# Patient Record
Sex: Female | Born: 1955 | Race: White | Hispanic: No | Smoking: Former smoker
Health system: Southern US, Community
[De-identification: ages and names within clinical notes are randomized; demographics above are authoritative.]

## PROBLEM LIST (undated history)

## (undated) DIAGNOSIS — M8580 Other specified disorders of bone density and structure, unspecified site: Secondary | ICD-10-CM

## (undated) DIAGNOSIS — M1990 Unspecified osteoarthritis, unspecified site: Secondary | ICD-10-CM

## (undated) DIAGNOSIS — E119 Type 2 diabetes mellitus without complications: Secondary | ICD-10-CM

## (undated) DIAGNOSIS — Z86010 Personal history of colonic polyps: Secondary | ICD-10-CM

## (undated) DIAGNOSIS — I10 Essential (primary) hypertension: Secondary | ICD-10-CM

## (undated) DIAGNOSIS — Z85828 Personal history of other malignant neoplasm of skin: Secondary | ICD-10-CM

## (undated) DIAGNOSIS — C801 Malignant (primary) neoplasm, unspecified: Secondary | ICD-10-CM

## (undated) DIAGNOSIS — F419 Anxiety disorder, unspecified: Secondary | ICD-10-CM

## (undated) DIAGNOSIS — T7840XA Allergy, unspecified, initial encounter: Secondary | ICD-10-CM

## (undated) HISTORY — PX: COLONOSCOPY: SHX174

## (undated) HISTORY — PX: BLADDER SURGERY: SHX569

## (undated) HISTORY — DX: Other specified disorders of bone density and structure, unspecified site: M85.80

## (undated) HISTORY — DX: Allergy, unspecified, initial encounter: T78.40XA

## (undated) HISTORY — DX: Unspecified osteoarthritis, unspecified site: M19.90

## (undated) HISTORY — DX: Anxiety disorder, unspecified: F41.9

## (undated) HISTORY — DX: Personal history of colonic polyps: Z86.010

## (undated) HISTORY — DX: Type 2 diabetes mellitus without complications: E11.9

## (undated) HISTORY — DX: Malignant (primary) neoplasm, unspecified: C80.1

## (undated) HISTORY — DX: Essential (primary) hypertension: I10

## (undated) HISTORY — DX: Personal history of other malignant neoplasm of skin: Z85.828

## (undated) HISTORY — PX: OTHER SURGICAL HISTORY: SHX169

---

## 1998-09-19 ENCOUNTER — Encounter: Payer: Self-pay | Admitting: Internal Medicine

## 1998-09-19 ENCOUNTER — Ambulatory Visit (HOSPITAL_COMMUNITY): Admission: RE | Admit: 1998-09-19 | Discharge: 1998-09-19 | Payer: Self-pay | Admitting: Internal Medicine

## 2000-08-06 ENCOUNTER — Encounter: Payer: Self-pay | Admitting: Gynecology

## 2000-08-06 ENCOUNTER — Encounter: Admission: RE | Admit: 2000-08-06 | Discharge: 2000-08-06 | Payer: Self-pay | Admitting: Gynecology

## 2001-07-27 ENCOUNTER — Other Ambulatory Visit: Admission: RE | Admit: 2001-07-27 | Discharge: 2001-07-27 | Payer: Self-pay | Admitting: Gynecology

## 2001-10-21 ENCOUNTER — Encounter: Admission: RE | Admit: 2001-10-21 | Discharge: 2001-10-21 | Payer: Self-pay | Admitting: Gynecology

## 2001-10-21 ENCOUNTER — Encounter: Payer: Self-pay | Admitting: Gynecology

## 2002-09-05 ENCOUNTER — Other Ambulatory Visit: Admission: RE | Admit: 2002-09-05 | Discharge: 2002-09-05 | Payer: Self-pay | Admitting: Gynecology

## 2002-12-29 ENCOUNTER — Encounter: Admission: RE | Admit: 2002-12-29 | Discharge: 2002-12-29 | Payer: Self-pay | Admitting: Gynecology

## 2002-12-29 ENCOUNTER — Encounter: Payer: Self-pay | Admitting: Gynecology

## 2003-09-20 ENCOUNTER — Other Ambulatory Visit: Admission: RE | Admit: 2003-09-20 | Discharge: 2003-09-20 | Payer: Self-pay | Admitting: Gynecology

## 2004-02-07 ENCOUNTER — Ambulatory Visit (HOSPITAL_COMMUNITY): Admission: RE | Admit: 2004-02-07 | Discharge: 2004-02-07 | Payer: Self-pay | Admitting: Gynecology

## 2004-07-11 ENCOUNTER — Ambulatory Visit: Payer: Self-pay | Admitting: Internal Medicine

## 2004-11-08 ENCOUNTER — Other Ambulatory Visit: Admission: RE | Admit: 2004-11-08 | Discharge: 2004-11-08 | Payer: Self-pay | Admitting: Gynecology

## 2005-02-27 ENCOUNTER — Ambulatory Visit (HOSPITAL_COMMUNITY): Admission: RE | Admit: 2005-02-27 | Discharge: 2005-02-27 | Payer: Self-pay | Admitting: Gynecology

## 2005-06-05 ENCOUNTER — Other Ambulatory Visit: Admission: RE | Admit: 2005-06-05 | Discharge: 2005-06-05 | Payer: Self-pay | Admitting: Gynecology

## 2005-06-16 ENCOUNTER — Ambulatory Visit: Payer: Self-pay | Admitting: Internal Medicine

## 2005-11-19 ENCOUNTER — Other Ambulatory Visit: Admission: RE | Admit: 2005-11-19 | Discharge: 2005-11-19 | Payer: Self-pay | Admitting: Gynecology

## 2006-05-06 ENCOUNTER — Ambulatory Visit (HOSPITAL_COMMUNITY): Admission: RE | Admit: 2006-05-06 | Discharge: 2006-05-06 | Payer: Self-pay | Admitting: Gynecology

## 2006-05-13 ENCOUNTER — Other Ambulatory Visit: Admission: RE | Admit: 2006-05-13 | Discharge: 2006-05-13 | Payer: Self-pay | Admitting: Gynecology

## 2006-09-23 ENCOUNTER — Ambulatory Visit: Payer: Self-pay | Admitting: Internal Medicine

## 2007-02-01 ENCOUNTER — Other Ambulatory Visit: Admission: RE | Admit: 2007-02-01 | Discharge: 2007-02-01 | Payer: Self-pay | Admitting: Gynecology

## 2007-04-14 ENCOUNTER — Ambulatory Visit: Payer: Self-pay | Admitting: Internal Medicine

## 2007-04-28 ENCOUNTER — Ambulatory Visit: Payer: Self-pay | Admitting: Internal Medicine

## 2007-04-28 ENCOUNTER — Encounter: Payer: Self-pay | Admitting: Internal Medicine

## 2007-04-28 LAB — HM COLONOSCOPY: HM Colonoscopy: NORMAL

## 2007-05-11 DIAGNOSIS — J309 Allergic rhinitis, unspecified: Secondary | ICD-10-CM | POA: Insufficient documentation

## 2007-05-11 DIAGNOSIS — I10 Essential (primary) hypertension: Secondary | ICD-10-CM

## 2007-05-12 ENCOUNTER — Ambulatory Visit: Payer: Self-pay | Admitting: Internal Medicine

## 2007-05-12 DIAGNOSIS — M4180 Other forms of scoliosis, site unspecified: Secondary | ICD-10-CM | POA: Insufficient documentation

## 2007-05-12 DIAGNOSIS — J329 Chronic sinusitis, unspecified: Secondary | ICD-10-CM | POA: Insufficient documentation

## 2007-05-12 DIAGNOSIS — M549 Dorsalgia, unspecified: Secondary | ICD-10-CM | POA: Insufficient documentation

## 2007-05-12 DIAGNOSIS — L608 Other nail disorders: Secondary | ICD-10-CM | POA: Insufficient documentation

## 2007-05-25 ENCOUNTER — Encounter: Payer: Self-pay | Admitting: Internal Medicine

## 2007-07-28 ENCOUNTER — Ambulatory Visit (HOSPITAL_COMMUNITY): Admission: RE | Admit: 2007-07-28 | Discharge: 2007-07-28 | Payer: Self-pay | Admitting: Gynecology

## 2007-08-03 ENCOUNTER — Encounter: Payer: Self-pay | Admitting: Internal Medicine

## 2007-08-19 ENCOUNTER — Encounter: Payer: Self-pay | Admitting: Internal Medicine

## 2007-08-27 ENCOUNTER — Ambulatory Visit: Payer: Self-pay | Admitting: Internal Medicine

## 2007-09-09 ENCOUNTER — Ambulatory Visit: Payer: Self-pay | Admitting: Internal Medicine

## 2008-04-14 ENCOUNTER — Telehealth: Payer: Self-pay | Admitting: Internal Medicine

## 2008-08-30 ENCOUNTER — Ambulatory Visit (HOSPITAL_COMMUNITY): Admission: RE | Admit: 2008-08-30 | Discharge: 2008-08-30 | Payer: Self-pay | Admitting: Gynecology

## 2009-04-05 ENCOUNTER — Encounter: Payer: Self-pay | Admitting: Internal Medicine

## 2009-06-26 ENCOUNTER — Ambulatory Visit: Payer: Self-pay | Admitting: Internal Medicine

## 2009-06-26 DIAGNOSIS — M899 Disorder of bone, unspecified: Secondary | ICD-10-CM | POA: Insufficient documentation

## 2009-06-26 DIAGNOSIS — M949 Disorder of cartilage, unspecified: Secondary | ICD-10-CM

## 2009-06-26 DIAGNOSIS — Z87891 Personal history of nicotine dependence: Secondary | ICD-10-CM | POA: Insufficient documentation

## 2009-06-26 DIAGNOSIS — Z85828 Personal history of other malignant neoplasm of skin: Secondary | ICD-10-CM

## 2009-06-26 LAB — CONVERTED CEMR LAB
Specific Gravity, Urine: 1.025
WBC Urine, dipstick: NEGATIVE

## 2009-06-28 ENCOUNTER — Telehealth: Payer: Self-pay | Admitting: *Deleted

## 2009-06-28 LAB — CONVERTED CEMR LAB
Alkaline Phosphatase: 77 units/L (ref 39–117)
BUN: 18 mg/dL (ref 6–23)
Bilirubin, Direct: 0.1 mg/dL (ref 0.0–0.3)
Calcium: 9 mg/dL (ref 8.4–10.5)
Cholesterol: 212 mg/dL — ABNORMAL HIGH (ref 0–200)
Direct LDL: 152.9 mg/dL
Eosinophils Absolute: 0.1 10*3/uL (ref 0.0–0.7)
Eosinophils Relative: 2.4 % (ref 0.0–5.0)
GFR calc non Af Amer: 79.92 mL/min (ref >60)
Glucose, Bld: 103 mg/dL — ABNORMAL HIGH (ref 70–99)
HCT: 39.8 % (ref 36.0–46.0)
Hemoglobin: 13.7 g/dL (ref 12.0–15.0)
Lymphocytes Relative: 42.6 % (ref 12.0–46.0)
MCHC: 34.4 g/dL (ref 30.0–36.0)
MCV: 96.4 fL (ref 78.0–100.0)
Neutrophils Relative %: 46.5 % (ref 43.0–77.0)
Platelets: 211 10*3/uL (ref 150.0–400.0)
RBC: 4.12 M/uL (ref 3.87–5.11)
RDW: 11.7 % (ref 11.5–14.6)
TSH: 1.34 microintl units/mL (ref 0.35–5.50)
Total Protein: 6.9 g/dL (ref 6.0–8.3)
Triglycerides: 106 mg/dL (ref 0.0–149.0)
WBC: 3.9 10*3/uL — ABNORMAL LOW (ref 4.5–10.5)

## 2009-10-08 ENCOUNTER — Ambulatory Visit (HOSPITAL_COMMUNITY): Admission: RE | Admit: 2009-10-08 | Discharge: 2009-10-08 | Payer: Self-pay | Admitting: Gynecology

## 2009-10-12 ENCOUNTER — Encounter: Admission: RE | Admit: 2009-10-12 | Discharge: 2009-10-12 | Payer: Self-pay | Admitting: Gynecology

## 2010-06-09 ENCOUNTER — Encounter: Payer: Self-pay | Admitting: Internal Medicine

## 2010-06-17 ENCOUNTER — Ambulatory Visit: Payer: Self-pay | Admitting: Internal Medicine

## 2010-06-17 DIAGNOSIS — J019 Acute sinusitis, unspecified: Secondary | ICD-10-CM

## 2010-09-22 ENCOUNTER — Encounter: Payer: Self-pay | Admitting: Gynecology

## 2010-10-03 NOTE — Letter (Signed)
Summary: [facility]  [facility]   Imported By: Maryln Gottron 06/13/2010 10:15:23  _____________________________________________________________________  External Attachment:    Type:   Image     Comment:   External Document

## 2010-10-03 NOTE — Assessment & Plan Note (Signed)
Summary: SINUS PRESSURE, CONGESTION // RS OPT RSC APPT TIME/NJR   Vital Signs:  Patient profile:   55 year old female Height:      65.5 inches Weight:      151 pounds BMI:     24.83 Temp:     100.0 degrees F oral Pulse rate:   72 / minute BP sitting:   100 / 60  (right arm) Cuff size:   regular  Vitals Entered By: Romualdo Bolk, CMA (AAMA) (June 17, 2010 3:40 PM) CC: Seen at [facility] 06/09/10 for sinus infection given amoxil 875mg . Pt states that she still has the sinus pressure, coughing, congestion and left ear has fluid in it. This has been going on for 3 weeks.   History of Present Illness: Valerie Gates comes in today  for above.   No better,     left side of face pressure.  was seen UC after a week of uri and rx with amox  for sinusitis  drainage becam clearer but now getting worse with increase pressure and feeling bad . tendes to get fall allergies  restarted with floase and allegra  last week. Still a problem .  NO fever known some cough but no sob.  Preventive Screening-Counseling & Management  Alcohol-Tobacco     Alcohol drinks/day: <1     Alcohol type: all     Smoking Status: quit     Year Quit: 21 years ago  Caffeine-Diet-Exercise     Caffeine use/day: 2-3     Does Patient Exercise: yes     Type of exercise: walking     Times/week: 7  Current Medications (verified): 1)  Allegra 180 Mg Tabs (Fexofenadine Hcl) .... Take 1 Tablet By Mouth Once A Day 2)  Benicar 20 Mg Tabs (Olmesartan Medoxomil) .... Take 1/2 Tablet By Mouth Once A Day 3)  Flonase 50 Mcg/act  Susp (Fluticasone Propionate) .... Use A Directed 4)  Naproxen 500 Mg Tabs (Naproxen) .Marland Kitchen.. 1 By Mouth Two Times A Day For Back Pain  Allergies (verified): No Known Drug Allergies  Past History:  Past medical, surgical, family and social histories (including risk factors) reviewed for relevance to current acute and chronic problems.  Past Medical History: Reviewed history from 06/26/2009  and no changes required. Allergic rhinitis Hypertension g4p3 Skin cancer, hx of  Past Surgical History: Reviewed history from 06/26/2009 and no changes required. Denies surgical history skin cancer  head   Past History:  Care Management: Gynecology: [facility] Gastroenterology: Juanda Chance Dermatology: [facility]   Family History: Reviewed history from 06/26/2009 and no changes required. Family History Diabetes 1st degree relative Family History Hypertension Family History Lung cancer Family History of Stroke M 1st degree relative <50  Social History: Reviewed history from 06/26/2009 and no changes required. 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  The patient denies anorexia, weight loss, weight gain, vision loss, chest pain, peripheral edema, prolonged cough, hemoptysis, difficulty walking, abnormal bleeding, and enlarged lymph nodes.    Physical Exam  General:  congested and in nad  non toxic Head:  normocephalic and atraumatic.   Eyes:  vision grossly intact.   Ears:  R ear normal, L ear normal, and no external deformities.   Nose:  no external  deformity and no external erythema.  left more than right  Mouth:  pharynx pink and moist.   Neck:  goiter no adenopathy  Lungs:  Normal respiratory effort, chest expands symmetrically. Lungs are clear to auscultation, no crackles or wheezes.no dullness.   Heart:  Normal rate and regular rhythm. S1 and S2 normal without gallop, murmur, click, rub or other extra sounds. Abdomen:  Bowel sounds positive,abdomen soft and non-tender without masses, organomegaly or  noted. Pulses:  pulses intact without delay   Extremities:  no clubbing cyanosis or edema  Neurologic:  non focal  Skin:  turgor normal, color normal, no ecchymoses, and no petechiae.     Cervical Nodes:  No lymphadenopathy noted Psych:  Oriented X3, normally interactive, and good eye contact.     Impression & Recommendations:  Problem # 1:  SINUSITIS - ACUTE-NOS (ICD-461.9) poss partially treated bacterial plus allergic .Marland Kitchen will rc for both .  and follow up if persistent or  progressive   Her updated medication list for this problem includes:    Flonase 50 Mcg/act Susp (Fluticasone propionate) ..... Use a directed    Amoxicillin-pot Clavulanate 875-125 Mg Tabs (Amoxicillin-pot clavulanate) .Marland Kitchen... 1 by mouth two times a day for sinusitis  Problem # 2:  ALLERGIC RHINITIS (ICD-477.9)  Her updated medication list for this problem includes:    Allegra 180 Mg Tabs (Fexofenadine hcl) .Marland Kitchen... Take 1 tablet by mouth once a day    Flonase 50 Mcg/act Susp (Fluticasone propionate) ..... Use a directed  Complete Medication List: 1)  Allegra 180 Mg Tabs (Fexofenadine hcl) .... Take 1 tablet by mouth once a day 2)  Benicar 20 Mg Tabs (Olmesartan medoxomil) .... Take 1/2 tablet by mouth once a day 3)  Flonase 50 Mcg/act Susp (Fluticasone propionate) .... Use a directed 4)  Naproxen 500 Mg Tabs (Naproxen) .Marland Kitchen.. 1 by mouth two times a day for back pain 5)  Amoxicillin-pot Clavulanate 875-125 Mg Tabs (Amoxicillin-pot clavulanate) .Marland Kitchen.. 1 by mouth two times a day for sinusitis 6)  Prednisone 20 Mg Tabs (Prednisone) .... Take 3 by mouth for 2 days and then 2 by mouth once daily for 3 days  or as directed  Patient Instructions: 1)  try afrin nose spray for up to 3 days  to relieve  the pressure. also saline washes and stay on allergy meds  2)  change antibiotc  if not improved in 3 days or so then add the prednisone as directed . 3)  call if not better by end of med and stay on allergy meds  Prescriptions: PREDNISONE 20 MG TABS (PREDNISONE) take 3 by mouth for 2 days and then 2 by mouth once daily for 3 days  or as directed  #15 x 0   Entered and Authorized by:   Madelin Headings MD   Signed  by:   Madelin Headings MD on 06/17/2010   Method used:   Print then Give to Patient   RxID:   [id number] AMOXICILLIN-POT CLAVULANATE 875-125 MG TABS (AMOXICILLIN-POT CLAVULANATE) 1 by mouth two times a day for sinusitis  #20 x 0   Entered and Authorized by:   Madelin Headings MD   Signed by:   Madelin Headings MD on 06/17/2010   Method used:   [facility] to        [facility]  [address] [phone]* (retail)       [address]  [place], [place]  [address]       Ph: [phone] or [phone]       Fax: [phone]   RxID:   [id number]    Orders Added: 1)  Est. Patient Level IV [27253]

## 2010-10-09 ENCOUNTER — Other Ambulatory Visit (HOSPITAL_COMMUNITY): Payer: Self-pay | Admitting: Gynecology

## 2010-10-09 DIAGNOSIS — Z1231 Encounter for screening mammogram for malignant neoplasm of breast: Secondary | ICD-10-CM

## 2010-10-23 ENCOUNTER — Ambulatory Visit (HOSPITAL_COMMUNITY): Payer: 59 | Attending: Gynecology

## 2010-11-12 ENCOUNTER — Ambulatory Visit (HOSPITAL_COMMUNITY)
Admission: RE | Admit: 2010-11-12 | Discharge: 2010-11-12 | Disposition: A | Discharge status: Home / Self Care | Payer: 59 | Source: Ambulatory Visit | Attending: Gynecology | Admitting: Gynecology

## 2010-11-12 DIAGNOSIS — Z1231 Encounter for screening mammogram for malignant neoplasm of breast: Secondary | ICD-10-CM | POA: Insufficient documentation

## 2011-02-08 ENCOUNTER — Other Ambulatory Visit: Payer: Self-pay | Admitting: Internal Medicine

## 2011-02-10 ENCOUNTER — Other Ambulatory Visit: Payer: Self-pay | Admitting: Internal Medicine

## 2011-02-10 MED ORDER — NAPROXEN 500 MG PO TABS: 500.0000 mg | ORAL_TABLET | Freq: Two times a day (BID) | ORAL | Status: DC

## 2011-02-10 NOTE — Telephone Encounter (Signed)
OK to refill once.   

## 2011-02-10 NOTE — Telephone Encounter (Signed)
Pt req new script for NAPROXEN 500 MG TABS (NAPROXEN). Pls call in to [facility] on [address].

## 2011-02-10 NOTE — Telephone Encounter (Signed)
Pt needs to schedule a cpx before next refill.

## 2011-02-10 NOTE — Telephone Encounter (Signed)
rx sent to pharmacy

## 2011-10-13 ENCOUNTER — Telehealth: Payer: Self-pay | Admitting: *Deleted

## 2011-10-13 MED ORDER — FLUTICASONE PROPIONATE 50 MCG/ACT NA SUSP: NASAL | Status: DC

## 2011-10-13 NOTE — Telephone Encounter (Signed)
Refill fluticasone

## 2011-10-15 ENCOUNTER — Ambulatory Visit (INDEPENDENT_AMBULATORY_CARE_PROVIDER_SITE_OTHER): Payer: 59 | Admitting: Internal Medicine

## 2011-10-15 ENCOUNTER — Encounter: Payer: Self-pay | Admitting: Internal Medicine

## 2011-10-15 VITALS — BP 100/70 | HR 66 | Wt 152.0 lb

## 2011-10-15 DIAGNOSIS — J309 Allergic rhinitis, unspecified: Secondary | ICD-10-CM

## 2011-10-15 DIAGNOSIS — M4180 Other forms of scoliosis, site unspecified: Secondary | ICD-10-CM

## 2011-10-15 DIAGNOSIS — M549 Dorsalgia, unspecified: Secondary | ICD-10-CM

## 2011-10-15 DIAGNOSIS — I10 Essential (primary) hypertension: Secondary | ICD-10-CM

## 2011-10-15 MED ORDER — FLUTICASONE PROPIONATE 50 MCG/ACT NA SUSP: NASAL | Status: DC

## 2011-10-15 NOTE — Progress Notes (Signed)
  Subjective:    Patient ID: Valerie Gates, female    DOB: 06-05-1956, 56 y.o.   MRN: [id number]  HPI   Patient comes in today for followup of medication. He has not been here in almost 2 years she is doing well  She sees her gynecologist Dr. Nicholas Lose on regular basis who is also treating her hypertension with Benicar.  She's has perennial allergies and takes Allegra once daily and Flonase when she thinks of it needs a refill on her Flonase. She doesn't take these medications she tends to get asthmatic bronchitis and asthma flares.  Ex-smoker no tobacco for years no exposures.  In regard to naproxen she takes occasional couple times a month at the most for her back no side effects noted. She still has some available.   Review of Systems Negative current chest pain shortness of breath falling cardiovascular symptoms other concerns  Past history family history social history reviewed in the electronic medical record.     Objective:   Physical Exam  Well-developed well-nourished in no acute distress HEENT: Normocephalic ;atraumatic , Eyes;  PERRL, EOMs  Full, lids and conjunctiva clear,,Ears: no deformities, canals nl, TM landmarks normal, Nose: no deformity or discharge  Mouth : OP clear without lesion or edema . Chest:  Clear to A&P without wheezes rales or rhonchi CV:  S1-S2 no gallops or murmurs peripheral perfusion is normal Neck: Supple without adenopathy or masses or bruits  Skin: normal capillary refill ,turgor , color: No acute rashes ,petechiae or bruising     Assessment & Plan:  Perennial allergic rhinitis  Discussed use of medication and suggestion take the Flonase on a regular basis for better control depending on what her symptoms are doing.  Intermittent low back pain with history of same history scoliosis no alarm features is not taking anti-inflammatories very often at all does not really need a refill made patient aware of interference potential with hypertension and  medications and renal function   Hypertension on  Benicar  treated and managed by her gynecologist she states that they have done for routine labs.  She declined to T. definite flu shot never had a flu or flu shot could come back for T. definite wishes

## 2011-10-15 NOTE — Patient Instructions (Signed)
REC   take flonase on a reg basis and the allegra as needed.  Call if needed.   Naproxyn can interfere with BP meds but ok to take as needed.

## 2011-10-15 NOTE — Progress Notes (Signed)
  Subjective:    Patient ID: Valerie Gates, female    DOB: 1956-05-26, 56 y.o.   MRN: [id number]  HPI  Allegra and flonase    Worst  Time of year.     naproxyn  Prn  Back once or twice a months.   Review of Systems     Objective:   Physical Exam        Assessment & Plan:

## 2011-11-12 ENCOUNTER — Other Ambulatory Visit (HOSPITAL_COMMUNITY): Payer: Self-pay | Admitting: Gynecology

## 2011-11-12 DIAGNOSIS — Z1231 Encounter for screening mammogram for malignant neoplasm of breast: Secondary | ICD-10-CM

## 2011-11-16 ENCOUNTER — Other Ambulatory Visit: Payer: Self-pay | Admitting: Internal Medicine

## 2011-11-18 NOTE — Telephone Encounter (Signed)
Left message on machine to have gyn fax [place] a copy of her last labs.

## 2011-11-18 NOTE — Telephone Encounter (Signed)
We need copy of her recent labs lfts and bmp from her gyne to continue to refilling her nsaid.    Refill x 1 while she gets [place] this to our record.  Thanks.

## 2011-12-10 ENCOUNTER — Ambulatory Visit (HOSPITAL_COMMUNITY): Payer: 59

## 2011-12-11 ENCOUNTER — Ambulatory Visit (HOSPITAL_COMMUNITY)
Admission: RE | Admit: 2011-12-11 | Discharge: 2011-12-11 | Disposition: A | Discharge status: Home / Self Care | Payer: 59 | Source: Ambulatory Visit | Attending: Gynecology | Admitting: Gynecology

## 2011-12-11 DIAGNOSIS — Z1231 Encounter for screening mammogram for malignant neoplasm of breast: Secondary | ICD-10-CM

## 2011-12-29 ENCOUNTER — Other Ambulatory Visit: Payer: Self-pay | Admitting: Internal Medicine

## 2012-07-06 ENCOUNTER — Telehealth: Payer: Self-pay | Admitting: Internal Medicine

## 2012-07-06 NOTE — Telephone Encounter (Signed)
Caller: Keilana/Patient; Patient Name: Valerie Gates; PCP: Berniece [facility]); Best Callback Phone Number: [phone] Calling regarding back pain that started 07/03/12, hurts when she moves certain ways, lower back pain and rates a 3-4 at this time. Has not taken any medicine for pain. Pain is constant. Afebrile. Wants a refill on Naprosyn, was told she needed labs first. Emergent signs and symptoms ruled out as per Back Symptoms protocol except for see in 72 hours due to mild to moderate pain with normal activity, appt scheduled for 07/07/12 at 2:15 unable to come 07/06/12. Call back parameters given and advised Tylenol/Motrin as directed.

## 2012-07-07 ENCOUNTER — Ambulatory Visit: Payer: 59 | Admitting: Internal Medicine

## 2012-09-10 ENCOUNTER — Encounter: Payer: Self-pay | Admitting: Family Medicine

## 2012-09-10 ENCOUNTER — Ambulatory Visit (INDEPENDENT_AMBULATORY_CARE_PROVIDER_SITE_OTHER): Payer: 59 | Admitting: Family Medicine

## 2012-09-10 VITALS — BP 112/88 | HR 106 | Temp 98.4°F | Wt 152.0 lb

## 2012-09-10 DIAGNOSIS — J069 Acute upper respiratory infection, unspecified: Secondary | ICD-10-CM

## 2012-09-10 NOTE — Patient Instructions (Signed)

## 2012-09-10 NOTE — Progress Notes (Signed)
Chief Complaint  Patient presents with  . sinus congestion and pressure     started Monday     HPI:  Acute visit for ?URI: -started:had sneezing and allergy issues for 3 days, then starting yesterday worsened -symptoms:nasal congestion, sore throat, cough, sinus pressure, sneezing, watery eyes -denies:fever, SOB, NVD, tooth pain -has tried: flonase, Careers adviser -sick contacts: none known -Hx of: AR and sinusitis  ROS: See pertinent positives and negatives per HPI.  Past Medical History  Diagnosis Date  . Allergic rhinitis   . Hypertension   . History of skin cancer     Family History  Problem Relation Age of Onset  . Diabetes    . Hypertension    . Lung cancer    . Stroke      female     History   Social History  . Marital Status: Divorced    Spouse Name: N/A    Number of Children: N/A  . Years of Education: N/A   Social History Main Topics  . Smoking status: Former Games developer  . Smokeless tobacco: None  . Alcohol Use: No  . Drug Use: No  . Sexually Active: None   Other Topics Concern  . 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    Current outpatient prescriptions:fexofenadine (ALLEGRA) 180 MG tablet, Take 180 mg by mouth daily., Disp: , Rfl: ;  fluticasone (FLONASE) 50 MCG/ACT nasal spray, Use as directed, Disp: 16 g, Rfl: 12;  fluticasone (FLONASE) 50 MCG/ACT nasal spray, USE AS DIRECTED, Disp: 16 g, Rfl: 3;  olmesartan (BENICAR) 20 MG tablet, Take 20 mg by mouth daily., Disp: , Rfl:  naproxen (NAPROSYN) 500 MG tablet, TAKE ONE TABLET BY MOUTH TWICE DAILY FOR BACK PAIN., Disp: 60 tablet, Rfl: 0  EXAM:  Filed Vitals:   09/10/12 1422  BP: 112/88  Pulse: 106  Temp: 98.4 F (36.9 C)    There is no height on file to calculate BMI.  GENERAL: vitals reviewed and listed above, alert, oriented, appears well hydrated and in no acute distress  HEENT: atraumatic, conjunttiva  clear, no obvious abnormalities on inspection of external nose and ears, normal appearance of ear canals and TMs, clear nasal congestion, mild post oropharyngeal erythema with PND, no tonsillar edema or exudate, no sinus TTP  NECK: no obvious masses on inspection  LUNGS: clear to auscultation bilaterally, no wheezes, rales or rhonchi, good air movement  CV: HRRR, no peripheral edema  MS: moves all extremities without noticeable abnormality  PSYCH: pleasant and cooperative, no obvious depression or anxiety  ASSESSMENT AND PLAN:  Discussed the following assessment and plan:  1. Upper respiratory infection   -likely viral URI, continued allergy meds, supportive care and return precuations -Patient advised to return or notify a doctor immediately if symptoms worsen or persist or new concerns arise.  Patient Instructions  INSTRUCTIONS FOR UPPER RESPIRATORY INFECTION:  -plenty of rest and fluids  -nasal saline wash 2-3 times daily (use prepackaged nasal saline or bottled/distilled water if making your own)   -clean nose with nasal saline before using the nasal steroid or sinex  -can use sinex nasal spray for drainage and nasal congestion - but do NOT use longer then 3-4 days  -can use tylenol or ibuprofen as directed for aches and sorethroat  -in the winter time, using a humidifier at night is helpful (please follow cleaning instructions)  -if you are taking a cough medication -  use only as directed, may also try a teaspoon of honey to coat the throat and throat lozenges  -for sore throat, salt water gargles can help  -follow up if you have fevers, facial pain, tooth pain, difficulty breathing or are worsening or not getting better in 5-7 days      Zilpha Mcandrew R.

## 2013-03-17 ENCOUNTER — Telehealth: Payer: Self-pay | Admitting: Internal Medicine

## 2013-03-17 ENCOUNTER — Ambulatory Visit (INDEPENDENT_AMBULATORY_CARE_PROVIDER_SITE_OTHER): Payer: 59 | Admitting: Internal Medicine

## 2013-03-17 ENCOUNTER — Encounter: Payer: Self-pay | Admitting: Internal Medicine

## 2013-03-17 VITALS — BP 108/72 | HR 85 | Temp 98.2°F | Wt 149.0 lb

## 2013-03-17 DIAGNOSIS — Z5181 Encounter for therapeutic drug level monitoring: Secondary | ICD-10-CM | POA: Insufficient documentation

## 2013-03-17 DIAGNOSIS — I10 Essential (primary) hypertension: Secondary | ICD-10-CM

## 2013-03-17 DIAGNOSIS — J309 Allergic rhinitis, unspecified: Secondary | ICD-10-CM

## 2013-03-17 DIAGNOSIS — M4180 Other forms of scoliosis, site unspecified: Secondary | ICD-10-CM

## 2013-03-17 LAB — HEPATIC FUNCTION PANEL
ALT: 26 U/L (ref 0–35)
Albumin: 4 g/dL (ref 3.5–5.2)

## 2013-03-17 LAB — CBC WITH DIFFERENTIAL/PLATELET
Eosinophils Relative: 1.9 % (ref 0.0–5.0)
HCT: 40 % (ref 36.0–46.0)
Hemoglobin: 13.4 g/dL (ref 12.0–15.0)
Lymphocytes Relative: 39 % (ref 12.0–46.0)
MCHC: 33.5 g/dL (ref 30.0–36.0)
MCV: 95 fl (ref 78.0–100.0)
Monocytes Relative: 7.9 % (ref 3.0–12.0)
Neutro Abs: 2.5 10*3/uL (ref 1.4–7.7)
Neutrophils Relative %: 50.1 % (ref 43.0–77.0)
Platelets: 224 10*3/uL (ref 150.0–400.0)
RBC: 4.22 Mil/uL (ref 3.87–5.11)
WBC: 5 10*3/uL (ref 4.5–10.5)

## 2013-03-17 LAB — BASIC METABOLIC PANEL
BUN: 15 mg/dL (ref 6–23)
CO2: 29 mEq/L (ref 19–32)
Potassium: 4.5 mEq/L (ref 3.5–5.1)

## 2013-03-17 MED ORDER — FLUTICASONE PROPIONATE 50 MCG/ACT NA SUSP: NASAL | Status: DC

## 2013-03-17 MED ORDER — OLMESARTAN MEDOXOMIL 20 MG PO TABS: 20.0000 mg | ORAL_TABLET | Freq: Every day | ORAL | Status: DC

## 2013-03-17 MED ORDER — NAPROXEN 500 MG PO TABS: ORAL_TABLET | ORAL | Status: DC

## 2013-03-17 NOTE — Telephone Encounter (Deleted)
Pt's Benicar 20mg  had been rx'd by her GYN doctor, Dr. Nicholas Lose. He has retired, and her rx has expired. She wants to know if Dr. Fabian Sharp will fill? Asking her to fill even for 30 days, if she needs an OV w/ WP.  [facility]

## 2013-03-17 NOTE — Progress Notes (Signed)
Chief Complaint  Patient presents with  . Follow-up    Meds  . Hypertension    HPI: Patient comes in today for SDA for   problem evaluation. Last ov with me was 2 /13  Since that time  She has done well but her  Luisa Dago has retired .  Dr Merri Brunette her Benicar for 7- 8  Years for  Borderline high bp  Has done well no se  . No recent blood tests.  Naproxyn.  Off and on for   2 years  Sporadically    Extra strength tylenol for back pain or so .  No se   Allergic  Ok comes and goes.   Needs refill flonase no asthma sx   No  tobacco  Quit 27 years., ROS: See pertinent positives and negatives per HPI. ocass toe cramps   Walks exercises at least 4 x per week no limitations UTD on HCM per pat and review  Past Medical History  Diagnosis Date  . Allergic rhinitis   . Hypertension   . History of skin cancer     Family History  Problem Relation Age of Onset  . Diabetes    . Hypertension    . Lung cancer    . Stroke      female     History   Social History  . Marital Status: Divorced    Spouse Name: N/A    Number of Children: N/A  . Years of Education: N/A   Social History Main Topics  . Smoking status: Former Games developer  . Smokeless tobacco: None  . Alcohol Use: No  . Drug Use: No  . Sexually Active: None   Other Topics Concern  . 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 fexofenadine (ALLEGRA) 180 MG tablet Take 180 mg by mouth daily.      . fluticasone (FLONASE) 50 MCG/ACT nasal spray 2 sprays in each nostril daily  16 g  11  . olmesartan (BENICAR) 20 MG tablet Take 1 tablet (20 mg total) by mouth daily.  30 tablet  11  . [DISCONTINUED] fluticasone (FLONASE) 50 MCG/ACT nasal spray USE AS DIRECTED  16 g  3  . [DISCONTINUED] olmesartan (BENICAR) 20 MG tablet Take 20 mg by mouth  daily.      . naproxen (NAPROSYN) 500 MG tablet TAKE ONE TABLET BY MOUTH TWICE DAILY FOR BACK PAIN.  60 tablet  1  . [DISCONTINUED] fluticasone (FLONASE) 50 MCG/ACT nasal spray Use as directed  16 g  12  . [DISCONTINUED] naproxen (NAPROSYN) 500 MG tablet TAKE ONE TABLET BY MOUTH TWICE DAILY FOR BACK PAIN.  60 tablet  0   No facility-administered encounter medications on file as of 03/17/2013.    EXAM:  BP 108/72  Pulse 85  Temp(Src) 98.2 F (36.8 C) (Oral)  Wt 149 lb (67.586 kg)  BMI 24.41 kg/m2  SpO2 98%  Body mass index is 24.41 kg/(m^2).  GENERAL: vitals reviewed and listed above, alert, oriented, appears well hydrated and in no acute distress HEENT: atraumatic, conjunctiva  clear, no obvious abnormalities on inspection of external nose and ears OP : no lesion edema or exudate  NECK: no obvious masses on inspection palpation  No adenopathy ? Thyroid palpable  no nodules LUNGS: clear to auscultation bilaterally, no wheezes, rales or rhonchi, good air movement CV: HRRR, no clubbing cyanosis or  peripheral edema nl cap refill  MS: moves all extremities without noticeable focal  abnormality PSYCH: pleasant and cooperative, no obvious depression or anxiety  ASSESSMENT AND PLAN:  Discussed the following assessment and plan:  HYPERTENSION - controlleld  take over  management from GYNE  lab monitoring risk reduction - Plan: Basic metabolic panel, CBC with Differential, Hepatic function panel  ALLERGIC RHINITIS - Plan: Basic metabolic panel, CBC with Differential, Hepatic function panel  Medication monitoring encounter - Plan: Basic metabolic panel, CBC with Differential, Hepatic function panel  SCOLIOSIS NEC - ocass back ache takes nsaid ocass  Risk benefit of medication discussed.  -Patient advised to return or notify health care team  if symptoms worsen or persist or new concerns arise.  Patient Instructions  Continue lifestyle intervention healthy eating and exercise . Will  notify you  of labs when available.   If ok then yearly check .      Neta Mends. Panosh M.D.

## 2013-03-17 NOTE — Telephone Encounter (Signed)
Pt decided to make appt with [facility] today. Disregard this. Thank you.

## 2013-03-17 NOTE — Patient Instructions (Signed)
Continue lifestyle intervention healthy eating and exercise . Will notify you  of labs when available.   If ok then yearly check .

## 2013-03-22 ENCOUNTER — Telehealth: Payer: Self-pay | Admitting: Internal Medicine

## 2013-03-22 NOTE — Telephone Encounter (Signed)
Ok to rx benicar 40 mg take 1/2 po qd or as directed  Disp 30 refill x 11   ALSO tell her her labs are good .  Can send her a copy if needed.

## 2013-03-22 NOTE — Telephone Encounter (Signed)
Caller: Valerie Gates/Patient; Phone: [phone]; Reason for Call: Pt request Benicar RX dose change from 20mg  to 40mg  to help save money.  Pt states, Dr Nicholas Lose, retired, use to prescribe 40mg  and Pt would take 1/2 tablet.  Please would like to ask Dr Fabian Sharp to do the same.  Pt was seen on 7-17.  Pt asymptomatic.  [facility], info in [place].  PLEASE REVIEW W/ DR [facility] AND F/U W/ PT.

## 2013-03-23 ENCOUNTER — Encounter: Payer: Self-pay | Admitting: Family Medicine

## 2013-03-23 NOTE — Telephone Encounter (Signed)
Left message for the pt to return my call. 

## 2013-03-25 ENCOUNTER — Other Ambulatory Visit: Payer: Self-pay | Admitting: Family Medicine

## 2013-03-25 MED ORDER — OLMESARTAN MEDOXOMIL 40 MG PO TABS: 20.0000 mg | ORAL_TABLET | Freq: Every day | ORAL | Status: DC

## 2013-03-25 NOTE — Telephone Encounter (Signed)
Pt notified rx sent to the pharmacy and labs are good.

## 2013-04-21 ENCOUNTER — Other Ambulatory Visit: Payer: Self-pay | Admitting: Internal Medicine

## 2013-04-21 DIAGNOSIS — Z1231 Encounter for screening mammogram for malignant neoplasm of breast: Secondary | ICD-10-CM

## 2013-04-29 ENCOUNTER — Ambulatory Visit (HOSPITAL_COMMUNITY)
Admission: RE | Admit: 2013-04-29 | Discharge: 2013-04-29 | Disposition: A | Discharge status: Home / Self Care | Payer: 59 | Source: Ambulatory Visit | Attending: Internal Medicine | Admitting: Internal Medicine

## 2013-04-29 DIAGNOSIS — Z1231 Encounter for screening mammogram for malignant neoplasm of breast: Secondary | ICD-10-CM | POA: Insufficient documentation

## 2013-07-22 ENCOUNTER — Encounter: Payer: Self-pay | Admitting: Internal Medicine

## 2013-07-22 ENCOUNTER — Ambulatory Visit (INDEPENDENT_AMBULATORY_CARE_PROVIDER_SITE_OTHER): Payer: 59 | Admitting: Internal Medicine

## 2013-07-22 VITALS — BP 132/80 | HR 92 | Temp 98.5°F | Wt 151.0 lb

## 2013-07-22 DIAGNOSIS — Z733 Stress, not elsewhere classified: Secondary | ICD-10-CM

## 2013-07-22 DIAGNOSIS — J019 Acute sinusitis, unspecified: Secondary | ICD-10-CM

## 2013-07-22 DIAGNOSIS — F439 Reaction to severe stress, unspecified: Secondary | ICD-10-CM

## 2013-07-22 DIAGNOSIS — J309 Allergic rhinitis, unspecified: Secondary | ICD-10-CM

## 2013-07-22 MED ORDER — AMOXICILLIN 500 MG PO CAPS: 500.0000 mg | ORAL_CAPSULE | Freq: Three times a day (TID) | ORAL | Status: DC

## 2013-07-22 MED ORDER — LORAZEPAM 0.5 MG PO TABS: 0.5000 mg | ORAL_TABLET | Freq: Two times a day (BID) | ORAL | Status: DC | PRN

## 2013-07-22 NOTE — Patient Instructions (Signed)
Continue your allergy medication add antibiotic for presumed sinusitis. Expect improvement in the next 3-5 days. Contact us if needed.  Can use lorazepam which is very similar to Xanax as needed for stressful situations. Please take caution if taking regularly. it is a dependent producing medication. Do not take if drinking alcohol. Driving heavy machinery. Etc.  If requiring to use medication on a regular basis then other treatment options are appropriate.  Can get records from Dr. Nicholas Lose sign release.

## 2013-07-22 NOTE — Progress Notes (Signed)
Chief Complaint  Patient presents with  . Sinus Pressure    Started 3 weeks ago.  Nasal congestion is a clear/yellow color.  Would also like to discuss restarting Xanax.  Has been on this in the past by Dr. Nicholas Lose.  . Nasal Congestion  . Cough  . Post Nasal Drip    HPI: Patient comes in today for SDA for  new problem evaluation. Symptoms ongoing for about 3 weeks thought it was allergy under treatment with Allegra and Flonase. However recently is having soreness pressure around the left eye frontal area. No fever chest pain shortness of breath some cough nasal discharge white to yellow.  Her GYN previously gave her a small amount of Xanax in the past when she was undergoing stress related to family tragedies and difficulties.  Marriage dissolution and daughters heroine use and in prison and. Her daughter is now finished rehabilitation but is still stressful at times.  Her GYN is retired. Last bottle Xanax was 2011. Asks if we can restart some medication as needed. ROS: See pertinent positives and negatives per HPI. No chest pain shortness of breath. No significant alcohol.  Past Medical History  Diagnosis Date  . Allergic rhinitis   . Hypertension   . History of skin cancer     Family History  Problem Relation Age of Onset  . Diabetes    . Hypertension    . Lung cancer    . Stroke      female     History   Social History  . Marital Status: Divorced    Spouse Name: N/A    Number of Children: N/A  . Years of Education: N/A   Social History Main Topics  . Smoking status: Former Games developer  . Smokeless tobacco: None  . Alcohol Use: No  . Drug Use: No  . Sexual Activity: None   Other Topics Concern  . 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 fexofenadine (ALLEGRA) 180 MG tablet Take 180 mg by mouth daily.  . fluticasone (FLONASE) 50 MCG/ACT nasal spray 2 sprays in each nostril daily  . naproxen (NAPROSYN) 500 MG tablet TAKE ONE TABLET BY MOUTH TWICE DAILY FOR BACK PAIN.  Marland Kitchen olmesartan (BENICAR) 40 MG tablet Take 0.5 tablets (20 mg total) by mouth daily.  Marland Kitchen amoxicillin (AMOXIL) 500 MG capsule Take 1 capsule (500 mg total) by mouth 3 (three) times daily. For sinusitis  . LORazepam (ATIVAN) 0.5 MG tablet Take 1 tablet (0.5 mg total) by mouth 2 (two) times daily as needed for anxiety. Can take 2 if needed    EXAM:  BP 132/80  Pulse 92  Temp(Src) 98.5 F (36.9 C) (Oral)  Wt 151 lb (68.493 kg)  SpO2 98%  Body mass index is 24.74 kg/(m^2). WDWN in NAD  quiet respirations; mildly congested  somewhat hoarse. Non toxic . HEENT: Normocephalic ;atraumatic , Eyes;  PERRL, EOMs  Full, lids and conjunctiva clear,,Ears: no deformities, canals nl, TM landmarks normal, Nose: no deformity or discharge but congested;face minimally tender left paranasal urea Mouth : OP clear without lesion or edema . Neck: Supple without adenopathy or masses or bruits Chest:  Clear to A&P without wheezes rales or  rhonchi CV:  S1-S2 no gallops or murmurs peripheral perfusion is normal Skin :nl perfusion and no acute rashes  MS: moves all extremities without noticeable focal  abnormality PSYCH: pleasant and cooperative, no obvious depression or anxiety  ASSESSMENT AND PLAN:  Discussed the following assessment and plan:  Acute sinusitis with symptoms greater than 10 days - Empiric treatment amoxicillin expectant management  Allergic rhinitis, cause unspecified  Stress - Situational anxiety risk benefit of medication discussed avoid long-term use risk of dependence etc.  -Patient advised to return or notify health care team  if symptoms worsen or persist or new concerns arise.  Patient Instructions  Continue your allergy medication add antibiotic for  presumed sinusitis. Expect improvement in the next 3-5 days. Contact us if needed.  Can use lorazepam which is very similar to Xanax as needed for stressful situations. Please take caution if taking regularly. it is a dependent producing medication. Do not take if drinking alcohol. Driving heavy machinery. Etc.  If requiring to use medication on a regular basis then other treatment options are appropriate.  Can get records from Dr. Nicholas Lose sign release.   Neta Mends. Valerie Gates M.D.

## 2013-08-22 ENCOUNTER — Telehealth: Payer: Self-pay | Admitting: Internal Medicine

## 2013-08-22 NOTE — Telephone Encounter (Signed)
Pt seen 11/21 for sinus inf. Instructed to let md know if not better. Pt still has mucus and irritated sinus w/ blood in drainage [facility]

## 2013-08-23 ENCOUNTER — Encounter: Payer: Self-pay | Admitting: Family Medicine

## 2013-08-23 ENCOUNTER — Telehealth: Payer: Self-pay | Admitting: Internal Medicine

## 2013-08-23 ENCOUNTER — Ambulatory Visit (INDEPENDENT_AMBULATORY_CARE_PROVIDER_SITE_OTHER): Payer: 59 | Admitting: Family Medicine

## 2013-08-23 VITALS — BP 110/74 | HR 76 | Temp 98.2°F | Wt 154.5 lb

## 2013-08-23 DIAGNOSIS — J019 Acute sinusitis, unspecified: Secondary | ICD-10-CM

## 2013-08-23 MED ORDER — AMOXICILLIN-POT CLAVULANATE 875-125 MG PO TABS: 1.0000 | ORAL_TABLET | Freq: Two times a day (BID) | ORAL | Status: DC

## 2013-08-23 NOTE — Telephone Encounter (Signed)
Noted  

## 2013-08-23 NOTE — Patient Instructions (Signed)
Start the augmentin and avoid the flonase for now.  Take care.  Try to get some rest.  Try not to blow you nose.

## 2013-08-23 NOTE — Assessment & Plan Note (Signed)
Presumed, start augmentin and f/u with PCP if not improved.  Hold flonse for now. Nontoxic.  D/w pt.  She agrees.

## 2013-08-23 NOTE — Progress Notes (Signed)
Pre-visit discussion using our clinic review tool. No additional management support is needed unless otherwise documented below in the visit note.  Seen 07/2013.  Started on amoxil for sinus infection.  Was getting some better, then got worse again in the last ~2 weeks, bloody mucous for the last ~4 days.  Using flonase until she noted bloody rhinorrhea.  Off flonase now.  No FCNAVD.  No ear pain. Some facial pain. Fatigued.  No ST. Sense of smell is altered.  Unable to pop ears with valsalva.    Meds, vitals, and allergies reviewed.   ROS: See HPI.  Otherwise, noncontributory.  GEN: nad, alert and oriented HEENT: mucous membranes moist, tm w/o erythema, nasal exam w/o erythema, purulent discharge noted,  OP with cobblestoning, nasal crusting noted.  NECK: supple w/o LA CV: rrr.   PULM: ctab, no inc wob EXT: no edema SKIN: no acute rash

## 2013-08-23 NOTE — Telephone Encounter (Signed)
Patient Information:  Caller Name: Denaly  Phone: [phone]  Patient: Valerie Gates, Valerie Gates  Gender: Female  DOB: [birth year]  Age: 57 Years  PCP: Berniece Andreas [facility])  Office Follow Up:  Does the office need to follow up with this patient?: No  Instructions For The Office: N/A   Symptoms  Reason For Call & Symptoms: Patient calling regarding blood in nasal secretions onset 08/19/13.  History of sinus infection 07/22/13.  Stopped nasal spray she uses for allergies.  Emergent symptoms ruled otu.  See Today or Tomorrow in Office per Colds protocol due to Nasal discharge present > 10 days.  Home care for the interim and parameters for callback given.  Reviewed Health History In EMR: Yes  Reviewed Medications In EMR: Yes  Reviewed Allergies In EMR: Yes  Reviewed Surgeries / Procedures: Yes  Date of Onset of Symptoms: 08/19/2013  Guideline(s) Used:  Colds  Disposition Per Guideline:   See Today or Tomorrow in Office  Reason For Disposition Reached:   Nasal discharge present > 10 days  Advice Given:  Humidifier:  If the air in your home is dry, use a cool-mist humidifier  Contagiousness:  The cold virus is present in your nasal secretions.  Wash your hands frequently with soap and water.  Expected Course:   Fever may last 2-3 days  Nasal discharge 7-14 days  Cough up to 2-3 weeks.  Patient Will Follow Care Advice:  YES  Appointment Scheduled:  08/23/2013 15:45:00 Appointment Scheduled Provider:  Crawford Givens

## 2013-08-24 NOTE — Telephone Encounter (Signed)
Talked with pt and she had called Monday and she stated no one ever got back with her and she went to see dr Para March at [facility].

## 2013-08-24 NOTE — Telephone Encounter (Signed)
Please cal patient and get more information Did the amox help at all and then relapse any fever  ? New sx?  To decide on next step/

## 2014-02-13 ENCOUNTER — Encounter: Payer: Self-pay | Admitting: Internal Medicine

## 2014-03-13 ENCOUNTER — Ambulatory Visit (INDEPENDENT_AMBULATORY_CARE_PROVIDER_SITE_OTHER): Payer: 59 | Admitting: Family Medicine

## 2014-03-13 ENCOUNTER — Encounter: Payer: Self-pay | Admitting: Family Medicine

## 2014-03-13 ENCOUNTER — Telehealth: Payer: Self-pay | Admitting: Internal Medicine

## 2014-03-13 VITALS — BP 86/55 | HR 83 | Temp 98.2°F | Ht 65.5 in | Wt 148.0 lb

## 2014-03-13 DIAGNOSIS — A088 Other specified intestinal infections: Secondary | ICD-10-CM

## 2014-03-13 DIAGNOSIS — A084 Viral intestinal infection, unspecified: Secondary | ICD-10-CM

## 2014-03-13 MED ORDER — DIPHENOXYLATE-ATROPINE 2.5-0.025 MG PO TABS: 2.0000 | ORAL_TABLET | Freq: Four times a day (QID) | ORAL | Status: DC | PRN

## 2014-03-13 NOTE — Telephone Encounter (Signed)
Pt is on the schedule for today to see Dr. Sarajane Jews.

## 2014-03-13 NOTE — Telephone Encounter (Signed)
Patient Information:  Caller Name: Biana  Phone: [phone]  Patient: Valerie Gates, Valerie Gates  Gender: Female  DOB: 05/22/1956  Age: 58 Years  PCP: Shanon Ace [facility])  Office Follow Up:  Does the office need to follow up with this patient?: No  Instructions For The Office: N/A   Symptoms  Reason For Call & Symptoms: 03/11/14 loose BM's but turned to diarrhea by evening, possible low grade fever.  03/12/14 severe diarrhea subsided around 7am (was going q95min throughout the night).   Felt better during day.   During evening began having chills again.   03/13/14 0400 diarrhea began again, subsided about 0700.   Afebrile.   No abd pain.   Last urinated within last 1-2 hours.  Drinking fluids.  Approximately 15-20 episodes of diarrhea in last 24 hours.  Reviewed Health History In EMR: Yes  Reviewed Medications In EMR: Yes  Reviewed Allergies In EMR: Yes  Reviewed Surgeries / Procedures: Yes  Date of Onset of Symptoms: 03/11/2014  Guideline(s) Used:  Diarrhea  Disposition Per Guideline:   Go to Office Now  Reason For Disposition Reached:   Age < 75 years and has had > 15 diarrhea stools in past 24 hours  Advice Given:  Reassurance:  In healthy adults, new-onset diarrhea is usually caused by a viral infection of the intestines, which you can treat at home. Diarrhea is the body's way of getting rid of the infection. Here are some tips on how to keep ahead of the fluid losses.  Fluids:  Drink more fluids, at least 8-10 glasses (8 oz or 240 ml) daily.  For example: sports drinks, diluted fruit juices, soft drinks.  Supplement this with saltine crackers or soups to make certain that you are getting sufficient fluid and salt to meet your body's needs.  Avoid caffeinated beverages (Reason: caffeine is mildly dehydrating).  Nutrition:  Maintaining some food intake during episodes of diarrhea is important.  Ideal initial foods include boiled starches/cereals (e.g., potatoes, rice,  noodles, wheat, oats) with a small amount of salt to taste.  Other acceptable foods include: bananas, yogurt, crackers, soup.  As your stools return to normal consistency, resume a normal diet.  Diarrhea Medication - Bismuth Subsalicylate (e.g., Kaopectate, PeptoBismol):  Helps reduce diarrhea, vomiting, and abdominal cramping.  Adult dosage: 2 tablets or 2 tablespoons (30 ml) by mouth every hour if diarrhea continues to a maximum of 8 doses in a 24-hour period.  Do not use for more than 2 days  This medication can make the stools look dark or even black (but not red or tarry).  Diarrhea Medication  - Imodium AD:   Helps reduce diarrhea.  Adult dosage: 4 mg (2 capsules or 4 teaspoons or 20 ml) is the recommended first dose. You may take an additional 2 mg (1 capsule or 2 teaspoons or 10 ml) after each loose BM.  Maximum dosage: 16 mg (8 capsules or 16 teaspoons or 80 ml).  Caution: Do not use if you have a fever greater than 100F (37.8C). Do not use if there is blood or mucus in your stools. Do not use for more than 2 days.  Expected Course:  Viral diarrhea lasts 4-7 days. Always worse on days 1 and 2.  Call Back If:  You become worse.  Patient Will Follow Care Advice:  YES  Appointment Scheduled:  03/13/2014 11:30:00 Appointment Scheduled Provider:  Alysia Penna [facility])

## 2014-03-13 NOTE — Progress Notes (Signed)
Pre visit review using our clinic review tool, if applicable. No additional management support is needed unless otherwise documented below in the visit note. 

## 2014-03-13 NOTE — Progress Notes (Signed)
   Subjective:    Patient ID: Valerie Gates, female    DOB: 28-Apr-1956, 58 y.o.   MRN: [id number]  HPI Here for 4 days of intermittent diarrhea. No fever or nausea or vomiting. No cramps or pain. She is able to sip fluids. No recent travel.    Review of Systems  Constitutional: Negative.   Gastrointestinal: Positive for diarrhea. Negative for nausea, vomiting, abdominal pain, constipation, blood in stool, abdominal distention, anal bleeding and rectal pain.       Objective:   Physical Exam  Constitutional: She appears well-developed and well-nourished.  Abdominal: Soft. Bowel sounds are normal. She exhibits no distension and no mass. There is no tenderness. There is no rebound and no guarding.          Assessment & Plan:  Try some Lomotil and drink fluids. Advance diet as tolerated. Written out of work today and tomorrow

## 2014-04-12 ENCOUNTER — Telehealth: Payer: Self-pay | Admitting: Internal Medicine

## 2014-04-12 NOTE — Telephone Encounter (Signed)
Pt is wanting to know if she can get rx olmesartan (BENICAR) 40 MG tablet and fluticasone (flonase) 50 mcg/act nasal spray, [facility].  States rx ran out in July.

## 2014-04-12 NOTE — Telephone Encounter (Signed)
Please schedule the pt for an appt to see WP.  Needs to be seen for BP and Med Check.  Has not been seen for these issues since 03/2013.  When she is on the schedule I will then fill.

## 2014-04-12 NOTE — Telephone Encounter (Signed)
appt scheduled for 05/05/14.

## 2014-05-05 ENCOUNTER — Ambulatory Visit: Payer: 59 | Admitting: Internal Medicine

## 2014-05-08 ENCOUNTER — Other Ambulatory Visit: Payer: Self-pay | Admitting: Internal Medicine

## 2014-05-09 NOTE — Telephone Encounter (Signed)
Sent to the pharmacy by e-scribe.  Pt has upcoming appt on 05/15/14

## 2014-05-15 ENCOUNTER — Encounter: Payer: Self-pay | Admitting: Internal Medicine

## 2014-05-15 ENCOUNTER — Ambulatory Visit (INDEPENDENT_AMBULATORY_CARE_PROVIDER_SITE_OTHER): Payer: 59 | Admitting: Internal Medicine

## 2014-05-15 VITALS — BP 108/74 | Temp 98.7°F | Ht 65.0 in | Wt 150.0 lb

## 2014-05-15 DIAGNOSIS — Z79899 Other long term (current) drug therapy: Secondary | ICD-10-CM

## 2014-05-15 DIAGNOSIS — Z299 Encounter for prophylactic measures, unspecified: Secondary | ICD-10-CM

## 2014-05-15 DIAGNOSIS — Z2821 Immunization not carried out because of patient refusal: Secondary | ICD-10-CM | POA: Insufficient documentation

## 2014-05-15 DIAGNOSIS — J309 Allergic rhinitis, unspecified: Secondary | ICD-10-CM

## 2014-05-15 DIAGNOSIS — I10 Essential (primary) hypertension: Secondary | ICD-10-CM

## 2014-05-15 LAB — BASIC METABOLIC PANEL
BUN: 12 mg/dL (ref 6–23)
CHLORIDE: 107 meq/L (ref 96–112)
CO2: 26 meq/L (ref 19–32)
CREATININE: 0.8 mg/dL (ref 0.4–1.2)
Calcium: 9.3 mg/dL (ref 8.4–10.5)
GFR: 78.49 mL/min (ref >60.00)
Glucose, Bld: 99 mg/dL (ref 70–99)
Potassium: 4 mEq/L (ref 3.5–5.1)
SODIUM: 142 meq/L (ref 135–145)

## 2014-05-15 LAB — LIPID PANEL
CHOLESTEROL: 220 mg/dL — AB (ref 0–200)
HDL: 40.9 mg/dL (ref >39.00)
LDL CALC: 149 mg/dL — AB (ref 0–99)
NonHDL: 179.1
TRIGLYCERIDES: 152 mg/dL — AB (ref 0.0–149.0)
Total CHOL/HDL Ratio: 5
VLDL: 30.4 mg/dL (ref 0.0–40.0)

## 2014-05-15 LAB — TSH: TSH: 0.89 u[IU]/mL (ref 0.35–4.50)

## 2014-05-15 MED ORDER — OLMESARTAN MEDOXOMIL 40 MG PO TABS: ORAL_TABLET | ORAL | Status: DC

## 2014-05-15 NOTE — Progress Notes (Signed)
Pre visit review using our clinic review tool, if applicable. No additional management support is needed unless otherwise documented below in the visit note.  Chief Complaint  Patient presents with  . Follow-up  . Hypertension    HPI: Valerie Gates  Comes in for bp evaluation and med check HT controlled no se of med  Needs refill  exercises without physical limitation  ocass leg cramp  flonase   Helps.   Allergies  Declines flu vaccine  Today  Rare use of loraz and hasnt used this for at least 3-4onths  Just helps to have on hand if needed  To see [facility] dr Garwin Brothers in November ROS: See pertinent positives and negatives per HPI.  Past Medical History  Diagnosis Date  . Allergic rhinitis   . Hypertension   . History of skin cancer     Family History  Problem Relation Age of Onset  . Diabetes    . Hypertension    . Lung cancer    . Stroke      female   . Drug abuse Daughter     History   Social History  . Marital Status: Divorced    Spouse Name: N/A    Number of Children: N/A  . Years of Education: N/A   Social History Main Topics  . Smoking status: Former Research scientist (life sciences)  . Smokeless tobacco: Never Used  . Alcohol Use: No  . Drug Use: No  . Sexual Activity: None   Other Topics Concern  . 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 fexofenadine (ALLEGRA) 180 MG tablet Take 180 mg by mouth daily.  . fluticasone (FLONASE) 50 MCG/ACT nasal spray INHALE 2 SPRAYS IN EACH NOSTRIL DAILY  . LORazepam (ATIVAN) 0.5 MG tablet Take 1 tablet (0.5 mg total) by mouth 2 (two) times daily as needed for anxiety. Can take 2 if needed  . naproxen (NAPROSYN) 500 MG tablet TAKE ONE TABLET BY MOUTH TWICE DAILY FOR BACK PAIN AS NEEDED.  Marland Kitchen olmesartan (BENICAR) 40  MG tablet TAKE 0.5 TABLETS (20 MG TOTAL) BY MOUTH DAILY... MAX OF 30 DAYS ON INSURANCE  . [DISCONTINUED] BENICAR 40 MG tablet TAKE 0.5 TABLETS (20 MG TOTAL) BY MOUTH DAILY... MAX OF 30 DAYS ON INSURANCE  . [DISCONTINUED] amoxicillin-clavulanate (AUGMENTIN) 875-125 MG per tablet Take 1 tablet by mouth 2 (two) times daily.  . [DISCONTINUED] diphenoxylate-atropine (LOMOTIL) 2.5-0.025 MG per tablet Take 2 tablets by mouth 4 (four) times daily as needed for diarrhea or loose stools.    EXAM:  BP 108/74  Temp(Src) 98.7 F (37.1 C) (Oral)  Ht 5\' 5"  (1.651 m)  Wt 150 lb (68.04 kg)  BMI 24.96 kg/m2  Body mass index is 24.96 kg/(m^2).  GENERAL: vitals reviewed and listed above, alert, oriented, appears well hydrated and in no acute distress HEENT: atraumatic, conjunctiva  clear, no obvious abnormalities on inspection of external nose and ears NECK: no obvious masses on inspection palpation  LUNGS: clear to auscultation bilaterally, no wheezes, rales or rhonchi, good air movement CV: HRRR, no clubbing cyanosis or  peripheral edema nl cap refill  Abdomen:  Sof,t normal bowel sounds without hepatosplenomegaly, no guarding rebound or masses no CVA tenderness  MS: moves all extremities without noticeable focal  abnormality PSYCH: pleasant and cooperative, no obvious depression or anxiety Lab Results  Component Value Date   WBC 5.0 03/17/2013   HGB 13.4 03/17/2013   HCT 40.0 03/17/2013   PLT 224.0 03/17/2013   GLUCOSE 106* 03/17/2013   CHOL 212* 06/26/2009   TRIG 106.0 06/26/2009   HDL 42.10 06/26/2009   LDLDIRECT 152.9 06/26/2009   ALT 26 03/17/2013   AST 21 03/17/2013   NA 141 03/17/2013   K 4.5 03/17/2013   CL 106 03/17/2013   CREATININE 0.9 03/17/2013   BUN 15 03/17/2013   CO2 29 03/17/2013   TSH 1.34 06/26/2009    ASSESSMENT AND PLAN:  Discussed the following assessment and plan:  Unspecified essential hypertension - due for lab monitoring controlled refill x 1 year  - Plan: Basic metabolic  panel, Lipid panel, TSH  Medication management - Plan: Basic metabolic panel, Lipid panel, TSH  Preventive measure - Plan: Basic metabolic panel, Lipid panel, TSH  Allergic rhinitis, cause unspecified - controlled  nasal steroid   Influenza vaccination declined by patient To see dr cousins for GY[facility] check   Declines flu vaccine  Non fasting lab today  Monitoring  -Patient advised to return or notify health care team  if symptoms worsen ,persist or new concerns arise.  Patient Instructions  Continue lifestyle intervention healthy eating and exercise . Will notify you  of labs when available.  Healthy lifestyle includes : At least 150 minutes of exercise weeks  , weight at healthy levels, which is usually   BMI 19-25. Avoid trans fats and processed foods;  Increase fresh fruits and veges to 5 servings per day. And avoid sweet beverages including tea and juice. Mediterranean diet with olive oil and nuts have been noted to be heart and brain healthy . Avoid tobacco products . Limit  alcohol to  7 per week for women and 14 servings for men.  Get adequate sleep . Wear seat belts . Don't text and drive .     Standley Brooking. Sherrell Weir M.D.

## 2014-05-15 NOTE — Patient Instructions (Signed)
Continue lifestyle intervention healthy eating and exercise . Will notify you  of labs when available.  Healthy lifestyle includes : At least 150 minutes of exercise weeks  , weight at healthy levels, which is usually   BMI 19-25. Avoid trans fats and processed foods;  Increase fresh fruits and veges to 5 servings per day. And avoid sweet beverages including tea and juice. Mediterranean diet with olive oil and nuts have been noted to be heart and brain healthy . Avoid tobacco products . Limit  alcohol to  7 per week for women and 14 servings for men.  Get adequate sleep . Wear seat belts . Don't text and drive .    

## 2014-06-09 ENCOUNTER — Other Ambulatory Visit: Payer: Self-pay | Admitting: Internal Medicine

## 2014-06-09 NOTE — Telephone Encounter (Signed)
Sent to the pharmacy by e-scribe. 

## 2014-07-03 ENCOUNTER — Encounter: Payer: Self-pay | Admitting: Internal Medicine

## 2014-07-04 ENCOUNTER — Other Ambulatory Visit: Payer: Self-pay | Admitting: Internal Medicine

## 2014-07-24 ENCOUNTER — Other Ambulatory Visit: Payer: Self-pay | Admitting: Internal Medicine

## 2014-07-24 NOTE — Telephone Encounter (Signed)
Denied. Filled for 1 year on 05/15/14

## 2014-08-04 ENCOUNTER — Other Ambulatory Visit (HOSPITAL_COMMUNITY): Payer: Self-pay | Admitting: Obstetrics and Gynecology

## 2014-08-04 DIAGNOSIS — M8580 Other specified disorders of bone density and structure, unspecified site: Secondary | ICD-10-CM

## 2014-08-09 ENCOUNTER — Ambulatory Visit (HOSPITAL_COMMUNITY)
Admission: RE | Admit: 2014-08-09 | Discharge: 2014-08-09 | Disposition: A | Discharge status: Home / Self Care | Payer: 59 | Source: Ambulatory Visit | Attending: Obstetrics and Gynecology | Admitting: Obstetrics and Gynecology

## 2014-08-09 DIAGNOSIS — M8580 Other specified disorders of bone density and structure, unspecified site: Secondary | ICD-10-CM | POA: Diagnosis not present

## 2014-08-09 DIAGNOSIS — Z13820 Encounter for screening for osteoporosis: Secondary | ICD-10-CM | POA: Diagnosis present

## 2014-08-09 DIAGNOSIS — Z780 Asymptomatic menopausal state: Secondary | ICD-10-CM | POA: Insufficient documentation

## 2015-02-20 ENCOUNTER — Encounter: Payer: Self-pay | Admitting: Internal Medicine

## 2015-03-21 ENCOUNTER — Ambulatory Visit (AMBULATORY_SURGERY_CENTER): Payer: Self-pay | Admitting: *Deleted

## 2015-03-21 VITALS — Ht 64.5 in | Wt 153.0 lb

## 2015-03-21 DIAGNOSIS — Z1211 Encounter for screening for malignant neoplasm of colon: Secondary | ICD-10-CM

## 2015-03-21 MED ORDER — NA SULFATE-K SULFATE-MG SULF 17.5-3.13-1.6 GM/177ML PO SOLN: 1.0000 | Freq: Once | ORAL | Status: DC

## 2015-03-21 NOTE — Progress Notes (Signed)
No egg or soy allergy No issues with past sedation No home 02 No diet pills emmi video declined  Pt requested miralax and gatorade as her prep . [facility] as per pt request.

## 2015-03-28 ENCOUNTER — Telehealth: Payer: Self-pay

## 2015-03-28 NOTE — Telephone Encounter (Signed)
Yes OK

## 2015-03-28 NOTE — Telephone Encounter (Signed)
Left message for pt to call back  °

## 2015-03-28 NOTE — Telephone Encounter (Signed)
Recall entered for 2018 with Dr. Carlean Purl.

## 2015-03-28 NOTE — Telephone Encounter (Signed)
Valerie Gates pt that is due for recall colon in 04/2017. Pt would like to have the recall entered with Carlean Purl, states the rest of her family sees [facility]. Dr. Carlean Purl will you approve this? Please advise.

## 2015-04-04 ENCOUNTER — Encounter: Payer: Self-pay | Admitting: Internal Medicine

## 2015-04-20 ENCOUNTER — Encounter: Payer: Self-pay | Admitting: Internal Medicine

## 2015-05-26 ENCOUNTER — Other Ambulatory Visit: Payer: Self-pay | Admitting: Internal Medicine

## 2015-05-26 DIAGNOSIS — I10 Essential (primary) hypertension: Secondary | ICD-10-CM

## 2015-05-28 NOTE — Telephone Encounter (Signed)
Received a refill request for Benicar.  Last refilled 05/15/14 for #30 with 11 refills.  Last office visit was 05/15/14.  Would you like me to refill this medication?

## 2015-05-28 NOTE — Telephone Encounter (Signed)
Due for yearly visit and lab monitoring.  Get BMP ( if done elsewhere she can bring [place] a copy )  Schedule Yearly office visit . Can refill medication  X 1 month med only  until all of above can be accomplished .

## 2015-05-29 NOTE — Telephone Encounter (Signed)
Patient notified. She is scheduled for OV and Lab draw.

## 2015-06-05 ENCOUNTER — Other Ambulatory Visit: Payer: Self-pay

## 2015-06-05 ENCOUNTER — Other Ambulatory Visit: Payer: Self-pay | Admitting: Internal Medicine

## 2015-06-06 ENCOUNTER — Other Ambulatory Visit (INDEPENDENT_AMBULATORY_CARE_PROVIDER_SITE_OTHER): Payer: 59

## 2015-06-06 DIAGNOSIS — Z0000 Encounter for general adult medical examination without abnormal findings: Secondary | ICD-10-CM | POA: Diagnosis not present

## 2015-06-06 LAB — CBC WITH DIFFERENTIAL/PLATELET
Basophils Absolute: 0 10*3/uL (ref 0.0–0.1)
Basophils Relative: 0.7 % (ref 0.0–3.0)
Eosinophils Absolute: 0 10*3/uL (ref 0.0–0.7)
Eosinophils Relative: 0.5 % (ref 0.0–5.0)
HCT: 41.5 % (ref 36.0–46.0)
Hemoglobin: 13.4 g/dL (ref 12.0–15.0)
Lymphocytes Relative: 31.3 % (ref 12.0–46.0)
Lymphs Abs: 1.3 10*3/uL (ref 0.7–4.0)
MCHC: 32.4 g/dL (ref 30.0–36.0)
MCV: 94.4 fl (ref 78.0–100.0)
Monocytes Absolute: 0.3 10*3/uL (ref 0.1–1.0)
Monocytes Relative: 7 % (ref 3.0–12.0)
Neutro Abs: 2.6 10*3/uL (ref 1.4–7.7)
Neutrophils Relative %: 60.5 % (ref 43.0–77.0)
Platelets: 221 10*3/uL (ref 150.0–400.0)
RBC: 4.4 Mil/uL (ref 3.87–5.11)
RDW: 12.6 % (ref 11.5–15.5)
WBC: 4.3 10*3/uL (ref 4.0–10.5)

## 2015-06-06 LAB — BASIC METABOLIC PANEL
BUN: 16 mg/dL (ref 6–23)
CALCIUM: 9.2 mg/dL (ref 8.4–10.5)
CO2: 29 meq/L (ref 19–32)
Chloride: 108 mEq/L (ref 96–112)
Creatinine, Ser: 0.78 mg/dL (ref 0.40–1.20)
GFR: 80.52 mL/min (ref >60.00)
GLUCOSE: 129 mg/dL — AB (ref 70–99)
Potassium: 4.1 mEq/L (ref 3.5–5.1)
Sodium: 144 mEq/L (ref 135–145)

## 2015-06-06 LAB — POCT URINALYSIS DIPSTICK
BILIRUBIN UA: NEGATIVE
GLUCOSE UA: NEGATIVE
KETONES UA: NEGATIVE
Nitrite, UA: NEGATIVE
PROTEIN UA: NEGATIVE
Urobilinogen, UA: 0.2
pH, UA: 5

## 2015-06-06 LAB — HEPATIC FUNCTION PANEL
ALBUMIN: 4 g/dL (ref 3.5–5.2)
ALT: 12 U/L (ref 0–35)
AST: 11 U/L (ref 0–37)
Alkaline Phosphatase: 67 U/L (ref 39–117)
BILIRUBIN TOTAL: 0.7 mg/dL (ref 0.2–1.2)
Bilirubin, Direct: 0.1 mg/dL (ref 0.0–0.3)
Total Protein: 6.3 g/dL (ref 6.0–8.3)

## 2015-06-06 LAB — LIPID PANEL
CHOLESTEROL: 191 mg/dL (ref 0–200)
HDL: 42.3 mg/dL (ref >39.00)
LDL CALC: 119 mg/dL — AB (ref 0–99)
NonHDL: 148.95
TRIGLYCERIDES: 149 mg/dL (ref 0.0–149.0)
Total CHOL/HDL Ratio: 5
VLDL: 29.8 mg/dL (ref 0.0–40.0)

## 2015-06-06 LAB — TSH: TSH: 0.74 u[IU]/mL (ref 0.35–4.50)

## 2015-06-14 ENCOUNTER — Encounter: Payer: Self-pay | Admitting: Internal Medicine

## 2015-07-04 ENCOUNTER — Encounter: Payer: Self-pay | Admitting: Internal Medicine

## 2015-07-04 ENCOUNTER — Ambulatory Visit (INDEPENDENT_AMBULATORY_CARE_PROVIDER_SITE_OTHER): Payer: 59 | Admitting: Internal Medicine

## 2015-07-04 VITALS — BP 100/64 | Temp 98.3°F | Ht 64.25 in | Wt 151.0 lb

## 2015-07-04 DIAGNOSIS — I10 Essential (primary) hypertension: Secondary | ICD-10-CM

## 2015-07-04 DIAGNOSIS — Z0000 Encounter for general adult medical examination without abnormal findings: Secondary | ICD-10-CM

## 2015-07-04 DIAGNOSIS — Z79899 Other long term (current) drug therapy: Secondary | ICD-10-CM

## 2015-07-04 DIAGNOSIS — R739 Hyperglycemia, unspecified: Secondary | ICD-10-CM

## 2015-07-04 NOTE — Progress Notes (Signed)
Pre visit review using our clinic review tool, if applicable. No additional management support is needed unless otherwise documented below in the visit note.  Chief Complaint  Patient presents with  . Annual Exam    HPI: Patient  Valerie Gates  59 y.o. comes in today for Preventive Health Care visit  Dr Ubaldo Glassing retired . Sees dr Garwin Brothers  bp ok  At home No hx dm dose like simple carbs  Not a lot of sweets   rrare use of benzo Concern that height is lower    Health Maintenance  Topic Date Due  . Hepatitis C Screening  13-Jul-1956  . INFLUENZA VACCINE  04/02/2015  . MAMMOGRAM  04/30/2015  . HIV Screening  07/03/2016 (Originally 01/20/1971)  . COLONOSCOPY  04/27/2017  . PAP SMEAR  07/03/2017  . TETANUS/TDAP  10/14/2021   Health Maintenance Review LIFESTYLE:  Exercise:  Some busy  Tobacco/ETS:n Alcohol: social Sugar beverages:n Sleep:ok  Drug use: no   ROS:  GEN/ HEENT: No fever, significant weight changes sweats headaches vision problems hearing changes, CV/ PULM; No chest pain shortness of breath cough, syncope,edema  change in exercise tolerance. GI /GU: No adominal pain, vomiting, change in bowel habits. No blood in the stool. No significant GU symptoms. SKIN/HEME: ,no acute skin rashes suspicious lesions or bleeding. No lymphadenopathy, nodules, masses.  NEURO/ PSYCH:  No neurologic signs such as weakness numbness. No depression anxiety. IMM/ Allergy: No unusual infections.  Allergy .   REST of 12 system review negative except as per HPI   Past Medical History  Diagnosis Date  . Allergic rhinitis   . Hypertension   . History of skin cancer   . Cancer ([place])     skin cancer-scalp  . Allergy     seasonal  . Arthritis     lower right back     Past Surgical History  Procedure Laterality Date  . Skin cancer removed on head    . Colonoscopy      Family History  Problem Relation Age of Onset  . Diabetes    . Hypertension    . Lung cancer    . Stroke     female   . Drug abuse Daughter   . Colon polyps Mother   . Colon cancer Neg Hx   . Rectal cancer Neg Hx   . Stomach cancer Neg Hx     Social History   Social History  . Marital Status: Divorced    Spouse Name: N/A  . Number of Children: N/A  . Years of Education: N/A   Social History Main Topics  . Smoking status: Former Research scientist (life sciences)  . Smokeless tobacco: Never Used  . Alcohol Use: No  . Drug Use: No  . Sexual Activity: Not Asked   Other Topics Concern  . 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 BENICAR 40 MG tablet TAKE 0.5 TABLETS (20 MG TOTAL) BY MOUTH DAILY... MAX OF 30 DAYS ON INSURANCE 30 tablet 0  . fexofenadine (ALLEGRA) 180 MG tablet Take 180 mg by mouth daily.    . fluticasone (FLONASE) 50 MCG/ACT nasal spray INHALE 2 SPRAYS IN EACH NOSTRIL DAILY 16 g 5  .  LORazepam (ATIVAN) 0.5 MG tablet Take 1 tablet (0.5 mg total) by mouth 2 (two) times daily as needed for anxiety. Can take 2 if needed (Patient not taking: Reported on 07/04/2015) 30 tablet 0  . naproxen (NAPROSYN) 500 MG tablet TAKE ONE TABLET BY MOUTH TWICE DAILY FOR BACK PAIN AS NEEDED.    . bisacodyl (DULCOLAX) 5 MG EC tablet Take 5 mg by mouth once. For colon 8-3    . polyethylene glycol powder (MIRALAX) powder Take 1 Container by mouth once. For colon 8-3     No facility-administered medications prior to visit.     EXAM:  BP 100/64 mmHg  Temp(Src) 98.3 F (36.8 C) (Oral)  Ht 5' 4.25" (1.632 m)  Wt 151 lb (68.493 kg)  BMI 25.72 kg/m2  Body mass index is 25.72 kg/(m^2).  Physical Exam: Vital signs reviewed HUT:MLYY is a well-developed well-nourished alert cooperative    who appearsr stated age in no acute distress.  HEENT: normocephalic atraumatic , Eyes: PERRL  EOM's full, conjunctiva clear, Nares: paten,t no deformity discharge or tenderness., Ears: no deformity EAC's clear TMs with normal landmarks. Mouth: clear OP, no lesions, edema.  Moist mucous membranes. Dentition in adequate repair. NECK: supple without masses, thyromegaly or bruits. CHEST/PULM:  Clear to auscultation and percussion breath sounds equal no wheeze , rales or rhonchi. No chest wall deformities or tenderness.Breast: normal by inspection . No dimpling, discharge, masses, tenderness or discharge . CV: PMI is nondisplaced, S1 S2 no gallops, murmurs, rubs. Peripheral pulses are full without delay.No JVD .  ABDOMEN: Bowel sounds normal nontender  No guard or rebound, no hepato splenomegal no CVA tenderness.  No hernia. Extremtities:  No clubbing cyanosis or edema, no acute joint swelling or redness no focal atrophy NEURO:  Oriented x3, cranial nerves 3-12 appear to be intact, no obvious focal weakness,gait within normal limits no abnormal reflexes or asymmetrical SKIN: No acute rashes normal turgor, color, no bruising or petechiae. PSYCH: Oriented, good eye contact, no obvious depression anxiety, cognition and judgment appear normal. LN: no cervical axillary inguinal adenopathy  Lab Results  Component Value Date   WBC 4.3 06/06/2015   HGB 13.4 06/06/2015   HCT 41.5 06/06/2015   PLT 221.0 06/06/2015   GLUCOSE 129* 06/06/2015   CHOL 191 06/06/2015   TRIG 149.0 06/06/2015   HDL 42.30 06/06/2015   LDLDIRECT 152.9 06/26/2009   LDLCALC 119* 06/06/2015   ALT 12 06/06/2015   AST 11 06/06/2015   NA 144 06/06/2015   K 4.1 06/06/2015   CL 108 06/06/2015   CREATININE 0.78 06/06/2015   BUN 16 06/06/2015   CO2 29 06/06/2015   TSH 0.74 06/06/2015   BP Readings from Last 3 Encounters:  07/04/15 100/64  05/15/14 108/74  03/13/14 86/55   Wt Readings from Last 3 Encounters:  07/04/15 151 lb (68.493 kg)  03/21/15 153 lb (69.4 kg)  05/15/14 150 lb (68.04 kg)    Ht Readings from Last 3  Encounters:  07/04/15 5' 4.25" (1.632 m)  03/21/15 5' 4.5" (1.638 m)  05/15/14 5' 5"  (1.651 m)    ASSESSMENT AND PLAN:  Discussed the following assessment and plan:  Visit for preventive health examination  Medication management  Essential hypertension - controlled   Hyperglycemia - up in early diabetic range x 1  lsi and plan fasting bg anda1c in about a month and go forem there  Reviewed  elevated fbs  And plan   Counseling  Fu if continued present after a month  consider metformin and nutrition referral  If still elevated  Repeat UA + dx hem  Maybe false positive  Patient Care Team: Burnis Medin, MD as PCP - General Selinda Orion, MD (Obstetrics and Gynecology) Servando Salina, MD as Consulting Physician (Obstetrics and Gynecology) Patient Instructions   Intensify lifestyle interventions. Avoiding  sinple sugars and carbs .  Plan flasting ( water and meds ok ) labs blood sugar  And hga1c in a month   Plan fu depending on results    If not better plan referral tpo nutrition and folow up.    Health Maintenance, Female Adopting a healthy lifestyle and getting preventive care can go a long way to promote health and wellness. Talk with your health care provider about what schedule of regular examinations is right for you. This is a good chance for you to check in with your provider about disease prevention and staying healthy. In between checkups, there are plenty of things you can do on your own. Experts have done a lot of research about which lifestyle changes and preventive measures are most likely to keep you healthy. Ask your health care provider for more information. WEIGHT AND DIET  Eat a healthy diet  Be sure to include plenty of vegetables, fruits, low-fat dairy products, and lean protein.  Do not eat a lot of foods high in solid fats, added sugars, or salt.  Get regular exercise. This is one of the most important things you can do for your health.  Most  adults should exercise for at least 150 minutes each week. The exercise should increase your heart rate and make you sweat (moderate-intensity exercise).  Most adults should also do strengthening exercises at least twice a week. This is in addition to the moderate-intensity exercise.  Maintain a healthy weight  Body mass index (BMI) is a measurement that can be used to identify possible weight problems. It estimates body fat based on height and weight. Your health care provider can help determine your BMI and help you achieve or maintain a healthy weight.  For females 36 years of age and older:   A BMI below 18.5 is considered underweight.  A BMI of 18.5 to 24.9 is normal.  A BMI of 25 to 29.9 is considered overweight.  A BMI of 30 and above is considered obese.  Watch levels of cholesterol and blood lipids  You should start having your blood tested for lipids and cholesterol at 58 years of age, then have this test every 5 years.  You may need to have your cholesterol levels checked more often if:  Your lipid or cholesterol levels are high.  You are older than 58 years of age.  You are at high risk for heart disease.  CANCER SCREENING   Lung Cancer  Lung cancer screening is recommended for adults 53-109 years old who are at high risk for lung cancer because of a history of smoking.  A yearly low-dose CT scan of the lungs is recommended for people who:  Currently smoke.  Have quit within the past 15 years.  Have at least a 30-pack-year history of smoking. A pack year is smoking an average of one pack of cigarettes a day for 1 year.  Yearly screening should continue until it has been 15 years since you quit.  Yearly screening should stop if you develop a health problem that would prevent you from having lung cancer treatment.  Breast Cancer  Practice breast self-awareness. This means understanding how  your breasts normally appear and feel.  It also means doing  regular breast self-exams. Let your health care provider know about any changes, no matter how small.  If you are in your 20s or 30s, you should have a clinical breast exam (CBE) by a health care provider every 1-3 years as part of a regular health exam.  If you are 75 or older, have a CBE every year. Also consider having a breast X-ray (mammogram) every year.  If you have a family history of breast cancer, talk to your health care provider about genetic screening.  If you are at high risk for breast cancer, talk to your health care provider about having an MRI and a mammogram every year.  Breast cancer gene (BRCA) assessment is recommended for women who have family members with BRCA-related cancers. BRCA-related cancers include:  Breast.  Ovarian.  Tubal.  Peritoneal cancers.  Results of the assessment will determine the need for genetic counseling and BRCA1 and BRCA2 testing. Cervical Cancer Your health care provider may recommend that you be screened regularly for cancer of the pelvic organs (ovaries, uterus, and vagina). This screening involves a pelvic examination, including checking for microscopic changes to the surface of your cervix (Pap test). You may be encouraged to have this screening done every 3 years, beginning at age 11.  For women ages 46-65, health care providers may recommend pelvic exams and Pap testing every 3 years, or they may recommend the Pap and pelvic exam, combined with testing for human papilloma virus (HPV), every 5 years. Some types of HPV increase your risk of cervical cancer. Testing for HPV may also be done on women of any age with unclear Pap test results.  Other health care providers may not recommend any screening for nonpregnant women who are considered low risk for pelvic cancer and who do not have symptoms. Ask your health care provider if a screening pelvic exam is right for you.  If you have had past treatment for cervical cancer or a condition  that could lead to cancer, you need Pap tests and screening for cancer for at least 20 years after your treatment. If Pap tests have been discontinued, your risk factors (such as having a new sexual partner) need to be reassessed to determine if screening should resume. Some women have medical problems that increase the chance of getting cervical cancer. In these cases, your health care provider may recommend more frequent screening and Pap tests. Colorectal Cancer  This type of cancer can be detected and often prevented.  Routine colorectal cancer screening usually begins at 58 years of age and continues through 58 years of age.  Your health care provider may recommend screening at an earlier age if you have risk factors for colon cancer.  Your health care provider may also recommend using home test kits to check for hidden blood in the stool.  A small camera at the end of a tube can be used to examine your colon directly (sigmoidoscopy or colonoscopy). This is done to check for the earliest forms of colorectal cancer.  Routine screening usually begins at age 39.  Direct examination of the colon should be repeated every 5-10 years through 58 years of age. However, you may need to be screened more often if early forms of precancerous polyps or small growths are found. Skin Cancer  Check your skin from head to toe regularly.  Tell your health care provider about any new moles or changes in moles, especially  if there is a change in a mole's shape or color.  Also tell your health care provider if you have a mole that is larger than the size of a pencil eraser.  Always use sunscreen. Apply sunscreen liberally and repeatedly throughout the day.  Protect yourself by wearing long sleeves, pants, a wide-brimmed hat, and sunglasses whenever you are outside. HEART DISEASE, DIABETES, AND HIGH BLOOD PRESSURE   High blood pressure causes heart disease and increases the risk of stroke. High blood  pressure is more likely to develop in:  People who have blood pressure in the high end of the normal range (130-139/85-89 mm Hg).  People who are overweight or obese.  People who are African American.  If you are 40-41 years of age, have your blood pressure checked every 3-5 years. If you are 59 years of age or older, have your blood pressure checked every year. You should have your blood pressure measured twice--once when you are at a hospital or clinic, and once when you are not at a hospital or clinic. Record the average of the two measurements. To check your blood pressure when you are not at a hospital or clinic, you can use:  An automated blood pressure machine at a pharmacy.  A home blood pressure monitor.  If you are between 7 years and 73 years old, ask your health care provider if you should take aspirin to prevent strokes.  Have regular diabetes screenings. This involves taking a blood sample to check your fasting blood sugar level.  If you are at a normal weight and have a low risk for diabetes, have this test once every three years after 58 years of age.  If you are overweight and have a high risk for diabetes, consider being tested at a younger age or more often. PREVENTING INFECTION  Hepatitis B  If you have a higher risk for hepatitis B, you should be screened for this virus. You are considered at high risk for hepatitis B if:  You were born in a country where hepatitis B is common. Ask your health care provider which countries are considered high risk.  Your parents were born in a high-risk country, and you have not been immunized against hepatitis B (hepatitis B vaccine).  You have HIV or AIDS.  You use needles to inject street drugs.  You live with someone who has hepatitis B.  You have had sex with someone who has hepatitis B.  You get hemodialysis treatment.  You take certain medicines for conditions, including cancer, organ transplantation, and  autoimmune conditions. Hepatitis C  Blood testing is recommended for:  Everyone born from [birth year] through 1964.  Anyone with known risk factors for hepatitis C. Sexually transmitted infections (STIs)  You should be screened for sexually transmitted infections (STIs) including gonorrhea and chlamydia if:  You are sexually active and are younger than 58 years of age.  You are older than 58 years of age and your health care provider tells you that you are at risk for this type of infection.  Your sexual activity has changed since you were last screened and you are at an increased risk for chlamydia or gonorrhea. Ask your health care provider if you are at risk.  If you do not have HIV, but are at risk, it may be recommended that you take a prescription medicine daily to prevent HIV infection. This is called pre-exposure prophylaxis (PrEP). You are considered at risk if:  You are sexually active and  do not regularly use condoms or know the HIV status of your partner(s).  You take drugs by injection.  You are sexually active with a partner who has HIV. Talk with your health care provider about whether you are at high risk of being infected with HIV. If you choose to begin PrEP, you should first be tested for HIV. You should then be tested every 3 months for as long as you are taking PrEP.  PREGNANCY   If you are premenopausal and you may become pregnant, ask your health care provider about preconception counseling.  If you may become pregnant, take 400 to 800 micrograms (mcg) of folic acid every day.  If you want to prevent pregnancy, talk to your health care provider about birth control (contraception). OSTEOPOROSIS AND MENOPAUSE   Osteoporosis is a disease in which the bones lose minerals and strength with aging. This can result in serious bone fractures. Your risk for osteoporosis can be identified using a bone density scan.  If you are 40 years of age or older, or if you are at risk  for osteoporosis and fractures, ask your health care provider if you should be screened.  Ask your health care provider whether you should take a calcium or vitamin D supplement to lower your risk for osteoporosis.  Menopause may have certain physical symptoms and risks.  Hormone replacement therapy may reduce some of these symptoms and risks. Talk to your health care provider about whether hormone replacement therapy is right for you.  HOME CARE INSTRUCTIONS   Schedule regular health, dental, and eye exams.  Stay current with your immunizations.   Do not use any tobacco products including cigarettes, chewing tobacco, or electronic cigarettes.  If you are pregnant, do not drink alcohol.  If you are breastfeeding, limit how much and how often you drink alcohol.  Limit alcohol intake to no more than 1 drink per day for nonpregnant women. One drink equals 12 ounces of beer, 5 ounces of wine, or 1 ounces of hard liquor.  Do not use street drugs.  Do not share needles.  Ask your health care provider for help if you need support or information about quitting drugs.  Tell your health care provider if you often feel depressed.  Tell your health care provider if you have ever been abused or do not feel safe at home.   This information is not intended to replace advice given to you by your health care provider. Make sure you discuss any questions you have with your health care provider.   Document Released: 03/03/2011 Document Revised: 09/08/2014 Document Reviewed: 07/20/2013 Elsevier Interactive Patient Education [employer].      Why follow it? Research shows. . Those who follow the Mediterranean diet have a reduced risk of heart disease  . The diet is associated with a reduced incidence of Parkinson's and Alzheimer's diseases . People following the diet may have longer life expectancies and lower rates of chronic diseases  . The Dietary Guidelines for Americans recommends  the Mediterranean diet as an eating plan to promote health and prevent disease  What Is the Mediterranean Diet?  . Healthy eating plan based on typical foods and recipes of Mediterranean-style cooking . The diet is primarily a plant based diet; these foods should make up a majority of meals   Starches - Plant based foods should make up a majority of meals - They are an important sources of vitamins, minerals, energy, antioxidants, and fiber - Choose whole  grains, foods high in fiber and minimally processed items  - Typical grain sources include wheat, oats, barley, corn, brown rice, bulgar, farro, millet, polenta, couscous  - Various types of beans include chickpeas, lentils, fava beans, black beans, white beans   Fruits  Veggies - Large quantities of antioxidant rich fruits & veggies; 6 or more servings  - Vegetables can be eaten raw or lightly drizzled with oil and cooked  - Vegetables common to the traditional Mediterranean Diet include: artichokes, arugula, beets, broccoli, brussel sprouts, cabbage, carrots, celery, collard greens, cucumbers, eggplant, kale, leeks, lemons, lettuce, mushrooms, okra, onions, peas, peppers, potatoes, pumpkin, radishes, rutabaga, shallots, spinach, sweet potatoes, turnips, zucchini - Fruits common to the Mediterranean Diet include: apples, apricots, avocados, cherries, clementines, dates, figs, grapefruits, grapes, melons, nectarines, oranges, peaches, pears, pomegranates, strawberries, tangerines  Fats - Replace butter and margarine with healthy oils, such as olive oil, canola oil, and tahini  - Limit nuts to no more than a handful a day  - Nuts include walnuts, almonds, pecans, pistachios, pine nuts  - Limit or avoid candied, honey roasted or heavily salted nuts - Olives are central to the [employer] - can be eaten whole or used in a variety of dishes   Meats Protein - Limiting red meat: no more than a few times a month - When eating red meat: choose  lean cuts and keep the portion to the size of deck of cards - Eggs: approx. 0 to 4 times a week  - Fish and lean poultry: at least 2 a week  - Healthy protein sources include, chicken, [place], lean beef, lamb - Increase intake of seafood such as tuna, salmon, trout, mackerel, shrimp, scallops - Avoid or limit high fat processed meats such as sausage and bacon  Dairy - Include moderate amounts of low fat dairy products  - Focus on healthy dairy such as fat free yogurt, skim milk, low or reduced fat cheese - Limit dairy products higher in fat such as whole or 2% milk, cheese, ice cream  Alcohol - Moderate amounts of red wine is ok  - No more than 5 oz daily for women (all ages) and men older than age 15  - No more than 10 oz of wine daily for men younger than 83  Other - Limit sweets and other desserts  - Use herbs and spices instead of salt to flavor foods  - Herbs and spices common to the traditional Mediterranean Diet include: basil, bay leaves, chives, cloves, cumin, fennel, garlic, lavender, marjoram, mint, oregano, parsley, pepper, rosemary, sage, savory, sumac, tarragon, thyme   It's not just a diet, it's a lifestyle:  . The Mediterranean diet includes lifestyle factors typical of those in the region  . Foods, drinks and meals are best eaten with others and savored . Daily physical activity is important for overall good health . This could be strenuous exercise like running and aerobics . This could also be more leisurely activities such as walking, housework, yard-work, or taking the stairs . Moderation is the key; a balanced and healthy diet accommodates most foods and drinks . Consider portion sizes and frequency of consumption of certain foods   Meal Ideas & Options:  . Breakfast:  o Whole wheat toast or whole wheat English muffins with peanut butter & hard boiled egg o Steel cut oats topped with apples & cinnamon and skim milk  o Fresh fruit: banana, strawberries, melon,  berries, peaches  o Smoothies: strawberries, bananas, greek yogurt,  peanut butter o Low fat greek yogurt with blueberries and granola  o Egg white omelet with spinach and mushrooms o Breakfast couscous: whole wheat couscous, apricots, skim milk, cranberries  . Sandwiches:  o Hummus and grilled vegetables (peppers, zucchini, squash) on whole wheat bread   o Grilled chicken on whole wheat pita with lettuce, tomatoes, cucumbers or tzatziki  o Tuna salad on whole wheat bread: tuna salad made with greek yogurt, olives, red peppers, capers, green onions o Garlic rosemary lamb pita: lamb sauted with garlic, rosemary, salt & pepper; add lettuce, cucumber, greek yogurt to pita - flavor with lemon juice and black pepper  . Seafood:  o Mediterranean grilled salmon, seasoned with garlic, basil, parsley, lemon juice and black pepper o Shrimp, lemon, and spinach whole-grain pasta salad made with low fat greek yogurt  o Seared scallops with lemon orzo  o Seared tuna steaks seasoned salt, pepper, coriander topped with tomato mixture of olives, tomatoes, olive oil, minced garlic, parsley, green onions and cappers  . Meats:  o Herbed greek chicken salad with kalamata olives, cucumber, feta  o Red bell peppers stuffed with spinach, bulgur, lean ground beef (or lentils) & topped with feta   o Kebabs: skewers of chicken, tomatoes, onions, zucchini, squash  o [place] burgers: made with red onions, mint, dill, lemon juice, feta cheese topped with roasted red peppers . Vegetarian o Cucumber salad: cucumbers, artichoke hearts, celery, red onion, feta cheese, tossed in olive oil & lemon juice  o Hummus and whole grain pita points with a greek salad (lettuce, tomato, feta, olives, cucumbers, red onion) o Lentil soup with celery, carrots made with vegetable broth, garlic, salt and pepper  o Tabouli salad: parsley, bulgur, mint, scallions, cucumbers, tomato, radishes, lemon juice, olive oil, salt and  pepper.         Standley Brooking. Giavanni Zeitlin M.D.

## 2015-07-04 NOTE — Patient Instructions (Addendum)
Intensify lifestyle interventions. Avoiding  sinple sugars and carbs .  Plan flasting ( water and meds ok ) labs blood sugar  And hga1c in a month   Plan fu depending on results    If not better plan referral tpo nutrition and folow up.    Health Maintenance, Female Adopting a healthy lifestyle and getting preventive care can go a long way to promote health and wellness. Talk with your health care provider about what schedule of regular examinations is right for you. This is a good chance for you to check in with your provider about disease prevention and staying healthy. In between checkups, there are plenty of things you can do on your own. Experts have done a lot of research about which lifestyle changes and preventive measures are most likely to keep you healthy. Ask your health care provider for more information. WEIGHT AND DIET  Eat a healthy diet  Be sure to include plenty of vegetables, fruits, low-fat dairy products, and lean protein.  Do not eat a lot of foods high in solid fats, added sugars, or salt.  Get regular exercise. This is one of the most important things you can do for your health.  Most adults should exercise for at least 150 minutes each week. The exercise should increase your heart rate and make you sweat (moderate-intensity exercise).  Most adults should also do strengthening exercises at least twice a week. This is in addition to the moderate-intensity exercise.  Maintain a healthy weight  Body mass index (BMI) is a measurement that can be used to identify possible weight problems. It estimates body fat based on height and weight. Your health care provider can help determine your BMI and help you achieve or maintain a healthy weight.  For females 21 years of age and older:   A BMI below 18.5 is considered underweight.  A BMI of 18.5 to 24.9 is normal.  A BMI of 25 to 29.9 is considered overweight.  A BMI of 30 and above is considered obese.  Watch  levels of cholesterol and blood lipids  You should start having your blood tested for lipids and cholesterol at 58 years of age, then have this test every 5 years.  You may need to have your cholesterol levels checked more often if:  Your lipid or cholesterol levels are high.  You are older than 58 years of age.  You are at high risk for heart disease.  CANCER SCREENING   Lung Cancer  Lung cancer screening is recommended for adults 60-15 years old who are at high risk for lung cancer because of a history of smoking.  A yearly low-dose CT scan of the lungs is recommended for people who:  Currently smoke.  Have quit within the past 15 years.  Have at least a 30-pack-year history of smoking. A pack year is smoking an average of one pack of cigarettes a day for 1 year.  Yearly screening should continue until it has been 15 years since you quit.  Yearly screening should stop if you develop a health problem that would prevent you from having lung cancer treatment.  Breast Cancer  Practice breast self-awareness. This means understanding how your breasts normally appear and feel.  It also means doing regular breast self-exams. Let your health care provider know about any changes, no matter how small.  If you are in your 20s or 30s, you should have a clinical breast exam (CBE) by a health care provider every 1-3  years as part of a regular health exam.  If you are 52 or older, have a CBE every year. Also consider having a breast X-ray (mammogram) every year.  If you have a family history of breast cancer, talk to your health care provider about genetic screening.  If you are at high risk for breast cancer, talk to your health care provider about having an MRI and a mammogram every year.  Breast cancer gene (BRCA) assessment is recommended for women who have family members with BRCA-related cancers. BRCA-related cancers include:  Breast.  Ovarian.  Tubal.  Peritoneal  cancers.  Results of the assessment will determine the need for genetic counseling and BRCA1 and BRCA2 testing. Cervical Cancer Your health care provider may recommend that you be screened regularly for cancer of the pelvic organs (ovaries, uterus, and vagina). This screening involves a pelvic examination, including checking for microscopic changes to the surface of your cervix (Pap test). You may be encouraged to have this screening done every 3 years, beginning at age 84.  For women ages 52-65, health care providers may recommend pelvic exams and Pap testing every 3 years, or they may recommend the Pap and pelvic exam, combined with testing for human papilloma virus (HPV), every 5 years. Some types of HPV increase your risk of cervical cancer. Testing for HPV may also be done on women of any age with unclear Pap test results.  Other health care providers may not recommend any screening for nonpregnant women who are considered low risk for pelvic cancer and who do not have symptoms. Ask your health care provider if a screening pelvic exam is right for you.  If you have had past treatment for cervical cancer or a condition that could lead to cancer, you need Pap tests and screening for cancer for at least 20 years after your treatment. If Pap tests have been discontinued, your risk factors (such as having a new sexual partner) need to be reassessed to determine if screening should resume. Some women have medical problems that increase the chance of getting cervical cancer. In these cases, your health care provider may recommend more frequent screening and Pap tests. Colorectal Cancer  This type of cancer can be detected and often prevented.  Routine colorectal cancer screening usually begins at 58 years of age and continues through 58 years of age.  Your health care provider may recommend screening at an earlier age if you have risk factors for colon cancer.  Your health care provider may also  recommend using home test kits to check for hidden blood in the stool.  A small camera at the end of a tube can be used to examine your colon directly (sigmoidoscopy or colonoscopy). This is done to check for the earliest forms of colorectal cancer.  Routine screening usually begins at age 31.  Direct examination of the colon should be repeated every 5-10 years through 58 years of age. However, you may need to be screened more often if early forms of precancerous polyps or small growths are found. Skin Cancer  Check your skin from head to toe regularly.  Tell your health care provider about any new moles or changes in moles, especially if there is a change in a mole's shape or color.  Also tell your health care provider if you have a mole that is larger than the size of a pencil eraser.  Always use sunscreen. Apply sunscreen liberally and repeatedly throughout the day.  Protect yourself by wearing long  sleeves, pants, a wide-brimmed hat, and sunglasses whenever you are outside. HEART DISEASE, DIABETES, AND HIGH BLOOD PRESSURE   High blood pressure causes heart disease and increases the risk of stroke. High blood pressure is more likely to develop in:  People who have blood pressure in the high end of the normal range (130-139/85-89 mm Hg).  People who are overweight or obese.  People who are African American.  If you are 44-28 years of age, have your blood pressure checked every 3-5 years. If you are 44 years of age or older, have your blood pressure checked every year. You should have your blood pressure measured twice--once when you are at a hospital or clinic, and once when you are not at a hospital or clinic. Record the average of the two measurements. To check your blood pressure when you are not at a hospital or clinic, you can use:  An automated blood pressure machine at a pharmacy.  A home blood pressure monitor.  If you are between 62 years and 107 years old, ask your health  care provider if you should take aspirin to prevent strokes.  Have regular diabetes screenings. This involves taking a blood sample to check your fasting blood sugar level.  If you are at a normal weight and have a low risk for diabetes, have this test once every three years after 58 years of age.  If you are overweight and have a high risk for diabetes, consider being tested at a younger age or more often. PREVENTING INFECTION  Hepatitis B  If you have a higher risk for hepatitis B, you should be screened for this virus. You are considered at high risk for hepatitis B if:  You were born in a country where hepatitis B is common. Ask your health care provider which countries are considered high risk.  Your parents were born in a high-risk country, and you have not been immunized against hepatitis B (hepatitis B vaccine).  You have HIV or AIDS.  You use needles to inject street drugs.  You live with someone who has hepatitis B.  You have had sex with someone who has hepatitis B.  You get hemodialysis treatment.  You take certain medicines for conditions, including cancer, organ transplantation, and autoimmune conditions. Hepatitis C  Blood testing is recommended for:  Everyone born from [birth year] through 1964.  Anyone with known risk factors for hepatitis C. Sexually transmitted infections (STIs)  You should be screened for sexually transmitted infections (STIs) including gonorrhea and chlamydia if:  You are sexually active and are younger than 58 years of age.  You are older than 58 years of age and your health care provider tells you that you are at risk for this type of infection.  Your sexual activity has changed since you were last screened and you are at an increased risk for chlamydia or gonorrhea. Ask your health care provider if you are at risk.  If you do not have HIV, but are at risk, it may be recommended that you take a prescription medicine daily to prevent HIV  infection. This is called pre-exposure prophylaxis (PrEP). You are considered at risk if:  You are sexually active and do not regularly use condoms or know the HIV status of your partner(s).  You take drugs by injection.  You are sexually active with a partner who has HIV. Talk with your health care provider about whether you are at high risk of being infected with HIV. If you choose  to begin PrEP, you should first be tested for HIV. You should then be tested every 3 months for as long as you are taking PrEP.  PREGNANCY   If you are premenopausal and you may become pregnant, ask your health care provider about preconception counseling.  If you may become pregnant, take 400 to 800 micrograms (mcg) of folic acid every day.  If you want to prevent pregnancy, talk to your health care provider about birth control (contraception). OSTEOPOROSIS AND MENOPAUSE   Osteoporosis is a disease in which the bones lose minerals and strength with aging. This can result in serious bone fractures. Your risk for osteoporosis can be identified using a bone density scan.  If you are 69 years of age or older, or if you are at risk for osteoporosis and fractures, ask your health care provider if you should be screened.  Ask your health care provider whether you should take a calcium or vitamin D supplement to lower your risk for osteoporosis.  Menopause may have certain physical symptoms and risks.  Hormone replacement therapy may reduce some of these symptoms and risks. Talk to your health care provider about whether hormone replacement therapy is right for you.  HOME CARE INSTRUCTIONS   Schedule regular health, dental, and eye exams.  Stay current with your immunizations.   Do not use any tobacco products including cigarettes, chewing tobacco, or electronic cigarettes.  If you are pregnant, do not drink alcohol.  If you are breastfeeding, limit how much and how often you drink alcohol.  Limit  alcohol intake to no more than 1 drink per day for nonpregnant women. One drink equals 12 ounces of beer, 5 ounces of wine, or 1 ounces of hard liquor.  Do not use street drugs.  Do not share needles.  Ask your health care provider for help if you need support or information about quitting drugs.  Tell your health care provider if you often feel depressed.  Tell your health care provider if you have ever been abused or do not feel safe at home.   This information is not intended to replace advice given to you by your health care provider. Make sure you discuss any questions you have with your health care provider.   Document Released: 03/03/2011 Document Revised: 09/08/2014 Document Reviewed: 07/20/2013 Elsevier Interactive Patient Education [employer].      Why follow it? Research shows. . Those who follow the Mediterranean diet have a reduced risk of heart disease  . The diet is associated with a reduced incidence of Parkinson's and Alzheimer's diseases . People following the diet may have longer life expectancies and lower rates of chronic diseases  . The Dietary Guidelines for Americans recommends the Mediterranean diet as an eating plan to promote health and prevent disease  What Is the Mediterranean Diet?  . Healthy eating plan based on typical foods and recipes of Mediterranean-style cooking . The diet is primarily a plant based diet; these foods should make up a majority of meals   Starches - Plant based foods should make up a majority of meals - They are an important sources of vitamins, minerals, energy, antioxidants, and fiber - Choose whole grains, foods high in fiber and minimally processed items  - Typical grain sources include wheat, oats, barley, corn, brown rice, bulgar, farro, millet, polenta, couscous  - Various types of beans include chickpeas, lentils, fava beans, black beans, white beans   Fruits  Veggies - Large quantities of antioxidant rich  fruits &  veggies; 6 or more servings  - Vegetables can be eaten raw or lightly drizzled with oil and cooked  - Vegetables common to the traditional Mediterranean Diet include: artichokes, arugula, beets, broccoli, brussel sprouts, cabbage, carrots, celery, collard greens, cucumbers, eggplant, kale, leeks, lemons, lettuce, mushrooms, okra, onions, peas, peppers, potatoes, pumpkin, radishes, rutabaga, shallots, spinach, sweet potatoes, turnips, zucchini - Fruits common to the Mediterranean Diet include: apples, apricots, avocados, cherries, clementines, dates, figs, grapefruits, grapes, melons, nectarines, oranges, peaches, pears, pomegranates, strawberries, tangerines  Fats - Replace butter and margarine with healthy oils, such as olive oil, canola oil, and tahini  - Limit nuts to no more than a handful a day  - Nuts include walnuts, almonds, pecans, pistachios, pine nuts  - Limit or avoid candied, honey roasted or heavily salted nuts - Olives are central to the [employer] - can be eaten whole or used in a variety of dishes   Meats Protein - Limiting red meat: no more than a few times a month - When eating red meat: choose lean cuts and keep the portion to the size of deck of cards - Eggs: approx. 0 to 4 times a week  - Fish and lean poultry: at least 2 a week  - Healthy protein sources include, chicken, [place], lean beef, lamb - Increase intake of seafood such as tuna, salmon, trout, mackerel, shrimp, scallops - Avoid or limit high fat processed meats such as sausage and bacon  Dairy - Include moderate amounts of low fat dairy products  - Focus on healthy dairy such as fat free yogurt, skim milk, low or reduced fat cheese - Limit dairy products higher in fat such as whole or 2% milk, cheese, ice cream  Alcohol - Moderate amounts of red wine is ok  - No more than 5 oz daily for women (all ages) and men older than age 79  - No more than 10 oz of wine daily for men younger than 59  Other - Limit  sweets and other desserts  - Use herbs and spices instead of salt to flavor foods  - Herbs and spices common to the traditional Mediterranean Diet include: basil, bay leaves, chives, cloves, cumin, fennel, garlic, lavender, marjoram, mint, oregano, parsley, pepper, rosemary, sage, savory, sumac, tarragon, thyme   It's not just a diet, it's a lifestyle:  . The Mediterranean diet includes lifestyle factors typical of those in the region  . Foods, drinks and meals are best eaten with others and savored . Daily physical activity is important for overall good health . This could be strenuous exercise like running and aerobics . This could also be more leisurely activities such as walking, housework, yard-work, or taking the stairs . Moderation is the key; a balanced and healthy diet accommodates most foods and drinks . Consider portion sizes and frequency of consumption of certain foods   Meal Ideas & Options:  . Breakfast:  o Whole wheat toast or whole wheat English muffins with peanut butter & hard boiled egg o Steel cut oats topped with apples & cinnamon and skim milk  o Fresh fruit: banana, strawberries, melon, berries, peaches  o Smoothies: strawberries, bananas, greek yogurt, peanut butter o Low fat greek yogurt with blueberries and granola  o Egg white omelet with spinach and mushrooms o Breakfast couscous: whole wheat couscous, apricots, skim milk, cranberries  . Sandwiches:  o Hummus and grilled vegetables (peppers, zucchini, squash) on whole wheat bread   o Grilled chicken on  whole wheat pita with lettuce, tomatoes, cucumbers or tzatziki  o Tuna salad on whole wheat bread: tuna salad made with greek yogurt, olives, red peppers, capers, green onions o Garlic rosemary lamb pita: lamb sauted with garlic, rosemary, salt & pepper; add lettuce, cucumber, greek yogurt to pita - flavor with lemon juice and black pepper  . Seafood:  o Mediterranean grilled salmon, seasoned with garlic,  basil, parsley, lemon juice and black pepper o Shrimp, lemon, and spinach whole-grain pasta salad made with low fat greek yogurt  o Seared scallops with lemon orzo  o Seared tuna steaks seasoned salt, pepper, coriander topped with tomato mixture of olives, tomatoes, olive oil, minced garlic, parsley, green onions and cappers  . Meats:  o Herbed greek chicken salad with kalamata olives, cucumber, feta  o Red bell peppers stuffed with spinach, bulgur, lean ground beef (or lentils) & topped with feta   o Kebabs: skewers of chicken, tomatoes, onions, zucchini, squash  o [place] burgers: made with red onions, mint, dill, lemon juice, feta cheese topped with roasted red peppers . Vegetarian o Cucumber salad: cucumbers, artichoke hearts, celery, red onion, feta cheese, tossed in olive oil & lemon juice  o Hummus and whole grain pita points with a greek salad (lettuce, tomato, feta, olives, cucumbers, red onion) o Lentil soup with celery, carrots made with vegetable broth, garlic, salt and pepper  o Tabouli salad: parsley, bulgur, mint, scallions, cucumbers, tomato, radishes, lemon juice, olive oil, salt and pepper.

## 2015-07-06 ENCOUNTER — Other Ambulatory Visit: Payer: Self-pay | Admitting: Internal Medicine

## 2015-07-06 NOTE — Telephone Encounter (Signed)
Sent to the pharmacy by e-scribe. 

## 2015-07-07 ENCOUNTER — Other Ambulatory Visit: Payer: Self-pay | Admitting: Internal Medicine

## 2015-07-09 NOTE — Telephone Encounter (Signed)
Sent to the pharmacy by e-scribe. 

## 2015-08-03 ENCOUNTER — Other Ambulatory Visit (INDEPENDENT_AMBULATORY_CARE_PROVIDER_SITE_OTHER): Payer: 59

## 2015-08-03 ENCOUNTER — Other Ambulatory Visit: Payer: Self-pay | Admitting: Family Medicine

## 2015-08-03 DIAGNOSIS — R82998 Other abnormal findings in urine: Secondary | ICD-10-CM

## 2015-08-03 DIAGNOSIS — Z0000 Encounter for general adult medical examination without abnormal findings: Secondary | ICD-10-CM | POA: Diagnosis not present

## 2015-08-03 DIAGNOSIS — N390 Urinary tract infection, site not specified: Secondary | ICD-10-CM

## 2015-08-03 DIAGNOSIS — R739 Hyperglycemia, unspecified: Secondary | ICD-10-CM | POA: Diagnosis not present

## 2015-08-03 LAB — POCT URINALYSIS DIPSTICK
Bilirubin, UA: NEGATIVE
GLUCOSE UA: NEGATIVE
KETONES UA: NEGATIVE
Nitrite, UA: NEGATIVE
Protein, UA: NEGATIVE
Urobilinogen, UA: 0.2
pH, UA: 5

## 2015-08-03 LAB — GLUCOSE, POCT (MANUAL RESULT ENTRY): POC GLUCOSE: 109 mg/dL — AB (ref 70–99)

## 2015-08-03 LAB — HEMOGLOBIN A1C: HEMOGLOBIN A1C: 6.9 % — AB (ref 4.6–6.5)

## 2015-08-08 ENCOUNTER — Telehealth: Payer: Self-pay | Admitting: Internal Medicine

## 2015-08-08 ENCOUNTER — Other Ambulatory Visit: Payer: Self-pay | Admitting: Family Medicine

## 2015-08-08 DIAGNOSIS — R7303 Prediabetes: Secondary | ICD-10-CM

## 2015-08-08 DIAGNOSIS — R319 Hematuria, unspecified: Secondary | ICD-10-CM

## 2015-08-08 DIAGNOSIS — R7309 Other abnormal glucose: Secondary | ICD-10-CM

## 2015-08-08 MED ORDER — METFORMIN HCL ER 500 MG PO TB24: 500.0000 mg | ORAL_TABLET | Freq: Every day | ORAL | Status: DC

## 2015-08-08 NOTE — Telephone Encounter (Signed)
Ms. Jares called saying she'd like Misty to move forward in sending a referral to the Nutritionist on her behalf. Please give her a call if you have questions or concerns.  Pt's ph# [phone] Thank you.

## 2015-08-08 NOTE — Telephone Encounter (Signed)
Referral placed in the system. 

## 2015-08-09 ENCOUNTER — Other Ambulatory Visit (INDEPENDENT_AMBULATORY_CARE_PROVIDER_SITE_OTHER): Payer: 59

## 2015-08-09 DIAGNOSIS — R319 Hematuria, unspecified: Secondary | ICD-10-CM

## 2015-08-09 LAB — URINALYSIS, ROUTINE W REFLEX MICROSCOPIC
Bilirubin Urine: NEGATIVE
Ketones, ur: NEGATIVE
Leukocytes, UA: NEGATIVE
Nitrite: NEGATIVE
SPECIFIC GRAVITY, URINE: 1.025 (ref 1.000–1.030)
Total Protein, Urine: NEGATIVE
URINE GLUCOSE: NEGATIVE
Urobilinogen, UA: 0.2 (ref 0.0–1.0)
pH: 5.5 (ref 5.0–8.0)

## 2015-08-10 ENCOUNTER — Other Ambulatory Visit: Payer: Self-pay | Admitting: Internal Medicine

## 2015-08-14 NOTE — Telephone Encounter (Signed)
Denied.  This medication was filled on 07/09/15

## 2015-08-20 ENCOUNTER — Telehealth: Payer: Self-pay | Admitting: Internal Medicine

## 2015-08-20 NOTE — Telephone Encounter (Signed)
Tell her to stop taking all Metformin and to follow up with Dr. Regis Bill in a few weeks

## 2015-08-20 NOTE — Telephone Encounter (Signed)
Pt recently started on metformin and can cause these stomach problems.  Will check with Dr. Sarajane Jews to see if medication needs to be changed.

## 2015-08-20 NOTE — Telephone Encounter (Signed)
Pt will come in to discuss on 09/07/15 at 10:30 AM

## 2015-08-20 NOTE — Telephone Encounter (Signed)
Ms. Woulard called saying for the third time in 2wks she's been awakened out of her sleep due to extreme pain in the middle of the night in addition to having loose stools. She said this will last for 1-2hrs every time. She also has been dealing with stomach cramps after taking the medication. She's wondering if this could be a side effect to the Metformin. She also mentioned that she's been stressed recently due to her sister being diagnosed with cancer so she didn't know if that could have something to do with it as well. She'd like a phone call regarding this.  Pt's ph# [phone] Thank you.

## 2015-08-20 NOTE — Telephone Encounter (Signed)
Left a message for a return call.

## 2015-09-07 ENCOUNTER — Ambulatory Visit: Payer: 59 | Admitting: Internal Medicine

## 2015-09-14 ENCOUNTER — Ambulatory Visit: Payer: 59 | Admitting: Internal Medicine

## 2015-10-18 ENCOUNTER — Other Ambulatory Visit: Payer: Self-pay | Admitting: Family Medicine

## 2015-10-18 ENCOUNTER — Telehealth: Payer: Self-pay | Admitting: Internal Medicine

## 2015-10-18 DIAGNOSIS — R739 Hyperglycemia, unspecified: Secondary | ICD-10-CM

## 2015-10-18 NOTE — Telephone Encounter (Signed)
Pt has been sch

## 2015-10-18 NOTE — Telephone Encounter (Signed)
Pt is due for follow up and lab work in March.  I have placed the lab order.  Please help her to make both appointments.  Thanks!

## 2015-10-18 NOTE — Telephone Encounter (Signed)
Pt would like to have a1c recheck in march 2017. Pt last a1c was elevated. Can I sch?

## 2015-11-15 ENCOUNTER — Other Ambulatory Visit (INDEPENDENT_AMBULATORY_CARE_PROVIDER_SITE_OTHER): Payer: 59

## 2015-11-15 DIAGNOSIS — R739 Hyperglycemia, unspecified: Secondary | ICD-10-CM | POA: Diagnosis not present

## 2015-11-15 LAB — HEMOGLOBIN A1C: HEMOGLOBIN A1C: 6.8 % — AB (ref 4.6–6.5)

## 2015-11-16 ENCOUNTER — Other Ambulatory Visit: Payer: 59

## 2015-11-22 NOTE — Progress Notes (Signed)
Pre visit review using our clinic review tool, if applicable. No additional management support is needed unless otherwise documented below in the visit note.  Chief Complaint  Patient presents with  . Follow-up    HPI: Valerie Gates 60 y.o.  Fucouple issue  bg uirne meds anxiety meds  Hyperglycemia   Had se of  The  Metformin  xr   Off and on  Diarrhea  Incontinence .    seeing nutiriotin   Helping  Change in g idet and beginning ot exercise   Cut out  moslty lot so breads  But does go out  BP   BP Readings from Last 3 Encounters:  11/23/15 108/60  07/04/15 100/64  05/15/14 108/74    ROS: See pertinent positives and negatives per HPI.  Past Medical History  Diagnosis Date  . Allergic rhinitis   . Hypertension   . History of skin cancer   . Cancer ([place])     skin cancer-scalp  . Allergy     seasonal  . Arthritis     lower right back     Family History  Problem Relation Age of Onset  . Diabetes    . Hypertension    . Lung cancer    . Stroke      female   . Drug abuse Daughter   . Colon polyps Mother   . Colon cancer Neg Hx   . Rectal cancer Neg Hx   . Stomach cancer Neg Hx     Social History   Social History  . Marital Status: Divorced    Spouse Name: N/A  . Number of Children: N/A  . Years of Education: N/A   Social History Main Topics  . Smoking status: Former Research scientist (life sciences)  . Smokeless tobacco: Never Used  . Alcohol Use: No  . Drug Use: No  . Sexual Activity: Not Asked   Other Topics Concern  . 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 BENICAR 40 MG tablet TAKE 1/2 TABLET (20 MG TOTAL) BY MOUTH DAILY... MAX OF 30 DAYS ON INSURANCE 30 tablet 3  . fexofenadine (ALLEGRA) 180 MG tablet Take 180 mg by mouth daily.    .  fluticasone (FLONASE) 50 MCG/ACT nasal spray INHALE 2 SPRAYS IN EACH NOSTRIL DAILY 16 g 5  . LORazepam (ATIVAN) 0.5 MG tablet Take 1 tablet (0.5 mg total) by mouth 2 (two) times daily as needed for anxiety. Can take 2 if needed 30 tablet 0  . naproxen (NAPROSYN) 500 MG tablet TAKE ONE TABLET BY MOUTH TWICE DAILY FOR BACK PAIN AS NEEDED.    . metFORMIN (GLUCOPHAGE-XR) 500 MG 24 hr tablet Take 1 tablet (500 mg total) by mouth daily with breakfast. 30 tablet 3   No facility-administered medications prior to visit.     EXAM:  BP 108/60 mmHg  Temp(Src) 98.3 F (36.8 C) (Oral)  Wt 142 lb 11.2 oz (64.728 kg)  Body mass index is 24.3 kg/(m^2).  GENERAL: vitals reviewed and listed above, alert, oriented, appears well hydrated and in no acute distress HEENT: atraumatic, conjunctiva  clear, no obvious abnormalities on inspection of external nose and ears OP : no lesion edema or exudate  NECK: no obvious masses on inspection palpation  LUNGS: clear to auscultation bilaterally, no wheezes, rales or rhonchi, good air movement CV: HRRR, no clubbing cyanosis or  peripheral edema nl cap refill  MS: moves all extremities without noticeable focal  abnormality PSYCH: pleasant and cooperative, no obvious depression or anxiety Lab Results  Component Value Date   WBC 4.3 06/06/2015   HGB 13.4 06/06/2015   HCT 41.5 06/06/2015   PLT 221.0 06/06/2015   GLUCOSE 129* 06/06/2015   CHOL 191 06/06/2015   TRIG 149.0 06/06/2015   HDL 42.30 06/06/2015   LDLDIRECT 152.9 06/26/2009   LDLCALC 119* 06/06/2015   ALT 12 06/06/2015   AST 11 06/06/2015   NA 144 06/06/2015   K 4.1 06/06/2015   CL 108 06/06/2015   CREATININE 0.78 06/06/2015   BUN 16 06/06/2015   CO2 29 06/06/2015   TSH 0.74 06/06/2015   HGBA1C 6.8* 11/15/2015   BP Readings from Last 3 Encounters:  11/23/15 108/60  07/04/15 100/64  05/15/14 108/74   Wt Readings from Last 3 Encounters:  11/23/15 142 lb 11.2 oz (64.728 kg)  07/04/15 151 lb  (68.493 kg)  03/21/15 153 lb (69.4 kg)       ASSESSMENT AND PLAN:  Discussed the following assessment and plan:  Type 2 diabetes mellitus without complication, without long-term current use of insulin (HCC) - early controlled so far   didnt tolerated metformin con lsi for 4 mor monst reviewed other med options  doing well with weight loss etc   Essential hypertension - controleed on 20 mg pf benicar  In diabetic range early   Down from 6.9  -Patient advised to return or notify health care team  if symptoms worsen ,persist or new concerns arise.  Patient Instructions  Continue  And intensify lifestyle intervention healthy eating and exercise . Ok to stay off the metformin  For now because of side effects.  Plan bmp  hg a1c in about 4 months and rov     Diabetes and Standards of Medical Care Diabetes is complicated. You may find that your diabetes team includes a dietitian, nurse, diabetes educator, eye doctor, and more. To help everyone know what is going on and to help you get the care you deserve, the following schedule of care was developed to help keep you on track. Below are the tests, exams, vaccines, medicines, education, and plans you will need. HbA1c test This test shows how well you have controlled your glucose over the past 2-3 months. It is used to see if your diabetes management plan needs to be adjusted.   It is performed at least 2 times a year if you are meeting treatment goals.  It is performed 4 times a year if therapy has changed or if you are not meeting treatment goals. Blood pressure test  This test is performed at every routine medical visit. The goal is less than 140/90 mm Hg for most people, but 130/80 mm Hg in some cases. Ask your health care provider about your goal. Dental exam  Follow up with the dentist regularly. Eye exam  If you are diagnosed with type 1 diabetes as a child, get an exam upon reaching the age of 75 years or older and having had  diabetes for 3-5 years. Yearly eye exams are recommended after that initial eye exam.  If you are diagnosed with type 1 diabetes as an adult, get an exam within 5 years of diagnosis and then yearly.  If you  are diagnosed with type 2 diabetes, get an exam as soon as possible after the diagnosis and then yearly. Foot care exam  Visual foot exams are performed at every routine medical visit. The exams check for cuts, injuries, or other problems with the feet.  You should have a complete foot exam performed every year. This exam includes an inspection of the structure and skin of your feet, a check of the pulses in your feet, and a check of the sensation in your feet.  Type 1 diabetes: The first exam is performed 5 years after diagnosis.  Type 2 diabetes: The first exam is performed at the time of diagnosis.  Check your feet nightly for cuts, injuries, or other problems with your feet. Tell your health care provider if anything is not healing. Kidney function test (urine microalbumin)  This test is performed once a year.  Type 1 diabetes: The first test is performed 5 years after diagnosis.  Type 2 diabetes: The first test is performed at the time of diagnosis.  A serum creatinine and estimated glomerular filtration rate (eGFR) test is done once a year to assess the level of chronic kidney disease (CKD), if present. Lipid profile (cholesterol, HDL, LDL, triglycerides)  Performed every 5 years for most people.  The goal for LDL is less than 100 mg/dL. If you are at high risk, the goal is less than 70 mg/dL.  The goal for HDL is 40 mg/dL-50 mg/dL for men and 50 mg/dL-60 mg/dL for women. An HDL cholesterol of 60 mg/dL or higher gives some protection against heart disease.  The goal for triglycerides is less than 150 mg/dL. Immunizations  The flu (influenza) vaccine is recommended yearly for every person 65 months of age or older who has diabetes.  The pneumonia (pneumococcal) vaccine is  recommended for every person 21 years of age or older who has diabetes. Adults 28 years of age or older may receive the pneumonia vaccine as a series of two separate shots.  The hepatitis B vaccine is recommended for adults shortly after they have been diagnosed with diabetes.  The Tdap (tetanus, diphtheria, and pertussis) vaccine should be given:  According to normal childhood vaccination schedules, for children.  Every 10 years, for adults who have diabetes. Diabetes self-management education  Education is recommended at diagnosis and ongoing as needed. Treatment plan  Your treatment plan is reviewed at every medical visit.   This information is not intended to replace advice given to you by your health care provider. Make sure you discuss any questions you have with your health care provider.   Document Released: 06/15/2009 Document Revised: 09/08/2014 Document Reviewed: 01/18/2013 Elsevier Interactive Patient Education 2016 [place] K. Panosh M.D.

## 2015-11-23 ENCOUNTER — Ambulatory Visit (INDEPENDENT_AMBULATORY_CARE_PROVIDER_SITE_OTHER): Payer: 59 | Admitting: Internal Medicine

## 2015-11-23 ENCOUNTER — Encounter: Payer: Self-pay | Admitting: Internal Medicine

## 2015-11-23 VITALS — BP 108/60 | Temp 98.3°F | Wt 142.7 lb

## 2015-11-23 DIAGNOSIS — E119 Type 2 diabetes mellitus without complications: Secondary | ICD-10-CM

## 2015-11-23 DIAGNOSIS — I10 Essential (primary) hypertension: Secondary | ICD-10-CM

## 2015-11-23 NOTE — Patient Instructions (Addendum)
Continue  And intensify lifestyle intervention healthy eating and exercise . Ok to stay off the metformin  For now because of side effects.  Plan bmp  hg a1c in about 4 months and rov     Diabetes and Standards of Medical Care Diabetes is complicated. You may find that your diabetes team includes a dietitian, nurse, diabetes educator, eye doctor, and more. To help everyone know what is going on and to help you get the care you deserve, the following schedule of care was developed to help keep you on track. Below are the tests, exams, vaccines, medicines, education, and plans you will need. HbA1c test This test shows how well you have controlled your glucose over the past 2-3 months. It is used to see if your diabetes management plan needs to be adjusted.   It is performed at least 2 times a year if you are meeting treatment goals.  It is performed 4 times a year if therapy has changed or if you are not meeting treatment goals. Blood pressure test  This test is performed at every routine medical visit. The goal is less than 140/90 mm Hg for most people, but 130/80 mm Hg in some cases. Ask your health care provider about your goal. Dental exam  Follow up with the dentist regularly. Eye exam  If you are diagnosed with type 1 diabetes as a child, get an exam upon reaching the age of 29 years or older and having had diabetes for 3-5 years. Yearly eye exams are recommended after that initial eye exam.  If you are diagnosed with type 1 diabetes as an adult, get an exam within 5 years of diagnosis and then yearly.  If you are diagnosed with type 2 diabetes, get an exam as soon as possible after the diagnosis and then yearly. Foot care exam  Visual foot exams are performed at every routine medical visit. The exams check for cuts, injuries, or other problems with the feet.  You should have a complete foot exam performed every year. This exam includes an inspection of the structure and skin of  your feet, a check of the pulses in your feet, and a check of the sensation in your feet.  Type 1 diabetes: The first exam is performed 5 years after diagnosis.  Type 2 diabetes: The first exam is performed at the time of diagnosis.  Check your feet nightly for cuts, injuries, or other problems with your feet. Tell your health care provider if anything is not healing. Kidney function test (urine microalbumin)  This test is performed once a year.  Type 1 diabetes: The first test is performed 5 years after diagnosis.  Type 2 diabetes: The first test is performed at the time of diagnosis.  A serum creatinine and estimated glomerular filtration rate (eGFR) test is done once a year to assess the level of chronic kidney disease (CKD), if present. Lipid profile (cholesterol, HDL, LDL, triglycerides)  Performed every 5 years for most people.  The goal for LDL is less than 100 mg/dL. If you are at high risk, the goal is less than 70 mg/dL.  The goal for HDL is 40 mg/dL-50 mg/dL for men and 50 mg/dL-60 mg/dL for women. An HDL cholesterol of 60 mg/dL or higher gives some protection against heart disease.  The goal for triglycerides is less than 150 mg/dL. Immunizations  The flu (influenza) vaccine is recommended yearly for every person 59 months of age or older who has diabetes.  The  pneumonia (pneumococcal) vaccine is recommended for every person 59 years of age or older who has diabetes. Adults 11 years of age or older may receive the pneumonia vaccine as a series of two separate shots.  The hepatitis B vaccine is recommended for adults shortly after they have been diagnosed with diabetes.  The Tdap (tetanus, diphtheria, and pertussis) vaccine should be given:  According to normal childhood vaccination schedules, for children.  Every 10 years, for adults who have diabetes. Diabetes self-management education  Education is recommended at diagnosis and ongoing as needed. Treatment  plan  Your treatment plan is reviewed at every medical visit.   This information is not intended to replace advice given to you by your health care provider. Make sure you discuss any questions you have with your health care provider.   Document Released: 06/15/2009 Document Revised: 09/08/2014 Document Reviewed: 01/18/2013 Elsevier Interactive Patient Education [employer].

## 2016-02-01 ENCOUNTER — Other Ambulatory Visit: Payer: Self-pay | Admitting: Internal Medicine

## 2016-02-01 NOTE — Telephone Encounter (Signed)
Last filled on 07/09/15 #30 +3, last OV 11/23/15. Okay to refill?

## 2016-02-01 NOTE — Telephone Encounter (Signed)
Ok to refill   Through October when due for labs monitoring

## 2016-03-07 ENCOUNTER — Telehealth: Payer: Self-pay | Admitting: Internal Medicine

## 2016-03-07 MED ORDER — OLMESARTAN MEDOXOMIL 40 MG PO TABS: 40.0000 mg | ORAL_TABLET | Freq: Every day | ORAL | Status: DC

## 2016-03-07 NOTE — Telephone Encounter (Signed)
Pt was told to call back due to her insurance will not pay for brand name of Benicar and she needs the generic.  Pt need refill.

## 2016-03-07 NOTE — Telephone Encounter (Signed)
Certainly  Ok to use generic

## 2016-03-07 NOTE — Telephone Encounter (Signed)
Sent to the pharmacy by e-scribe. 

## 2016-03-17 ENCOUNTER — Other Ambulatory Visit (INDEPENDENT_AMBULATORY_CARE_PROVIDER_SITE_OTHER): Payer: 59

## 2016-03-17 DIAGNOSIS — I10 Essential (primary) hypertension: Secondary | ICD-10-CM | POA: Diagnosis not present

## 2016-03-17 DIAGNOSIS — E119 Type 2 diabetes mellitus without complications: Secondary | ICD-10-CM | POA: Diagnosis not present

## 2016-03-17 LAB — BASIC METABOLIC PANEL
BUN: 17 mg/dL (ref 6–23)
CHLORIDE: 107 meq/L (ref 96–112)
CO2: 28 meq/L (ref 19–32)
Calcium: 9.3 mg/dL (ref 8.4–10.5)
Creatinine, Ser: 0.83 mg/dL (ref 0.40–1.20)
GFR: 74.75 mL/min (ref >60.00)
GLUCOSE: 113 mg/dL — AB (ref 70–99)
POTASSIUM: 3.4 meq/L — AB (ref 3.5–5.1)
SODIUM: 141 meq/L (ref 135–145)

## 2016-03-17 LAB — HEMOGLOBIN A1C: Hgb A1c MFr Bld: 6.4 % (ref 4.6–6.5)

## 2016-03-24 ENCOUNTER — Encounter: Payer: Self-pay | Admitting: Internal Medicine

## 2016-03-24 ENCOUNTER — Ambulatory Visit (INDEPENDENT_AMBULATORY_CARE_PROVIDER_SITE_OTHER): Payer: 59 | Admitting: Internal Medicine

## 2016-03-24 VITALS — BP 106/70 | Temp 98.6°F | Wt 135.4 lb

## 2016-03-24 DIAGNOSIS — E119 Type 2 diabetes mellitus without complications: Secondary | ICD-10-CM | POA: Diagnosis not present

## 2016-03-24 DIAGNOSIS — I10 Essential (primary) hypertension: Secondary | ICD-10-CM | POA: Diagnosis not present

## 2016-03-24 DIAGNOSIS — R7303 Prediabetes: Secondary | ICD-10-CM

## 2016-03-24 NOTE — Progress Notes (Signed)
Pre visit review using our clinic review tool, if applicable. No additional management support is needed unless otherwise documented below in the visit note.  Chief Complaint  Patient presents with  . Follow-up    HPI: Valerie Gates 60 y.o.  Comes in for fu Chronic disease management  bp DM new onset  And meds  Has  Intensified lsi since last visit walking  And dietary changes. No numbness vision issues  Rare [place] of  Ativan  or back meds .    ROS: See pertinent positives and negatives per HPI. No cp sob syncope  Past Medical History:  Diagnosis Date  . Allergic rhinitis   . Allergy    seasonal  . Arthritis    lower right back   . Cancer ([place])    skin cancer-scalp  . History of skin cancer   . Hypertension     Family History  Problem Relation Age of Onset  . Diabetes    . Hypertension    . Lung cancer    . Stroke      female   . Drug abuse Daughter   . Colon polyps Mother   . Colon cancer Neg Hx   . Rectal cancer Neg Hx   . Stomach cancer Neg Hx     Social History   Social History  . Marital status: Divorced    Spouse name: N/A  . Number of children: N/A  . Years of education: N/A   Social History Main Topics  . Smoking status: Former Research scientist (life sciences)  . Smokeless tobacco: Never Used  . Alcohol use No  . Drug use: No  . Sexual activity: Not Asked   Other Topics Concern  . 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 fexofenadine (ALLEGRA) 180 MG tablet Take 180 mg by mouth daily.    . fluticasone (FLONASE) 50 MCG/ACT nasal spray INHALE 2 SPRAYS IN EACH NOSTRIL DAILY 16 g 5  . LORazepam (ATIVAN) 0.5 MG tablet Take 1 tablet (0.5 mg total) by mouth 2 (two) times daily as needed for anxiety. Can take 2 if needed 30 tablet 0  .  naproxen (NAPROSYN) 500 MG tablet TAKE ONE TABLET BY MOUTH TWICE DAILY FOR BACK PAIN AS NEEDED.    Marland Kitchen olmesartan (BENICAR) 40 MG tablet Take 1 tablet (40 mg total) by mouth daily. 30 tablet 5   No facility-administered medications prior to visit.      EXAM:  BP 106/70 (BP Location: Right Arm, Patient Position: Sitting, Cuff Size: Normal)   Temp 98.6 F (37 C) (Oral)   Wt 135 lb 6.4 oz (61.4 kg)   BMI 23.06 kg/m   Body mass index is 23.06 kg/m.  GENERAL: vitals reviewed and listed above, alert, oriented, appears well hydrated and in no acute distress HEENT: atraumatic, conjunctiva  clear, no obvious abnormalities on inspection of external nose and ears NECK: no obvious masses on inspection palpation  LUNGS: clear to auscultation bilaterally, no wheezes, rales or rhonchi, good air movement CV: HRRR, no clubbing cyanosis or  peripheral edema nl cap refill  MS: moves all extremities without noticeable focal  abnormality PSYCH: pleasant and cooperative, no obvious depression or anxiety Diabetic Foot  Exam - Simple   Simple Foot Form Diabetic Foot exam was performed with the following findings:  Yes 03/24/2016  8:54 AM  Visual Inspection No deformities, no ulcerations, no other skin breakdown bilaterally:  Yes Sensation Testing Intact to touch and monofilament testing bilaterally:  Yes Pulse Check Posterior Tibialis and Dorsalis pulse intact bilaterally:  Yes Comments     ASSESSMENT AND PLAN:  Discussed the following assessment and plan:  Type 2 diabetes mellitus without complication, without long-term current use of insulin (HCC) - now in prediabeteic range  lsi  doing well  6 months pv with a1c   Essential hypertension - good control  inc k rich foods no diuretics .   Pre-diabetes  -Patient advised to return or notify health care team  if symptoms worsen ,persist or new concerns arise.  Patient Instructions  Good job with life style intervention .   Continue lifestyle  intervention healthy eating and exercise .  Get your eye exam .   Preventive visit in  About 6 months         Standley Brooking. [place] Kestner M.D.

## 2016-03-24 NOTE — Patient Instructions (Signed)
Good job with life style intervention .   Continue lifestyle intervention healthy eating and exercise .  Get your eye exam .   Preventive visit in  About 6 months

## 2016-04-23 LAB — HM DIABETES EYE EXAM

## 2016-04-28 ENCOUNTER — Encounter: Payer: Self-pay | Admitting: Family Medicine

## 2016-05-07 ENCOUNTER — Other Ambulatory Visit: Payer: Self-pay | Admitting: Internal Medicine

## 2016-05-15 ENCOUNTER — Telehealth: Payer: Self-pay | Admitting: Internal Medicine

## 2016-05-15 NOTE — Telephone Encounter (Signed)
[facility] - Client [facility] Patient Name: Valerie Gates DOB: 27-Feb-1956 Initial Comment Caller has had bad pain for the past 2 days on the right side. had a kidney stone 17 years ago. Nurse Assessment Nurse: Dimas Chyle, RN, Dellis Filbert Date/Time Eilene Ghazi Time): 05/15/2016 8:40:42 AM Confirm and document reason for call. If symptomatic, describe symptoms. You must click the next button to save text entered. ---Caller has had bad pain for the past 2 days on the right side. Had a kidney stone 17 years ago. Flank pain. No fever. No blood in urine. Has the patient traveled out of the country within the last 30 days? ---No Does the patient have any new or worsening symptoms? ---Yes Will a triage be completed? ---Yes Related visit to physician within the last 2 weeks? ---N/A Does the PT have any chronic conditions? (i.e. diabetes, asthma, etc.) ---Yes List chronic conditions. ---HTN Is this a behavioral health or substance abuse call? ---No Guidelines Guideline Title Affirmed Question Affirmed Notes Flank Pain MODERATE pain (e.g., interferes with normal activities or awakens from sleep) Final Disposition User See Physician within [place], RN, [employer] Referrals REFERRED TO PCP OFFICE Disagree/Comply: Leta Baptist

## 2016-05-15 NOTE — Telephone Encounter (Signed)
FYI, appt today at 9:15 with Panosh.

## 2016-05-15 NOTE — Progress Notes (Signed)
Pre visit review using our clinic review tool, if applicable. No additional management support is needed unless otherwise documented below in the visit note.  Chief Complaint  Patient presents with  . Right Side Lower Back Pain  . Right Leg Pain    HPI: Valerie Gates 60 y.o.  Comes in for  Above    2 night ago sublt  And then  Not as bad and taking exc ptr astrengtha nd naproxyn   Last night   Awoke from sleep  230 am . 2 nights ago with severe right flank back pain that was nauseated and make her felt sick     Baseline back pain rlp  Hx back arthritis but this was different and presented as Nauseating pain .  But no vomiting  Seems to be better up and moving and night bad.  This am some  Leg  Pain upper lateral .  Diarrhea sat night  Sun .    Not currently and no fever or UTI symptoms. No fever  gros heamturia  But has had microscopic in past  Today's had some right lateral anterior upper thigh pain that isn't severe but she doesn't remember having this before. ROS: See pertinent positives and negatives per HPI. Had left renal stone  Noted 2000 saw Dr. Monico Blitz at that time. Her daughter also has kidney stones that needed to be blasted nofever.   Past Medical History:  Diagnosis Date  . Allergic rhinitis   . Allergy    seasonal  . Arthritis    lower right back   . Cancer ([place])    skin cancer-scalp  . History of skin cancer   . Hypertension     Family History  Problem Relation Age of Onset  . Diabetes    . Hypertension    . Lung cancer    . Stroke      female   . Drug abuse Daughter   . Nephrolithiasis Daughter   . Colon polyps Mother   . Colon cancer Neg Hx   . Rectal cancer Neg Hx   . Stomach cancer Neg Hx     Social History   Social History  . Marital status: Divorced    Spouse name: N/A  . Number of children: N/A  . Years of education: N/A   Social History Main Topics  . Smoking status: Former Research scientist (life sciences)  . Smokeless tobacco: Never Used  . Alcohol use No    . Drug use: No  . Sexual activity: Not Asked   Other Topics Concern  . 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 fluticasone (FLONASE) 50 MCG/ACT nasal spray INHALE 2 SPRAYS IN EACH NOSTRIL DAILY 16 g 5  . fluticasone (FLONASE) 50 MCG/ACT nasal spray INHALE 2 SPRAYS IN EACH NOSTRIL DAILY 16 g 5  . LORazepam (ATIVAN) 0.5 MG tablet Take 1 tablet (0.5 mg total) by mouth 2 (two) times daily as needed for anxiety. Can take 2 if needed 30 tablet 0  . naproxen (NAPROSYN) 500 MG tablet TAKE ONE TABLET BY MOUTH TWICE DAILY FOR BACK PAIN AS NEEDED.    Marland Kitchen olmesartan (BENICAR) 40 MG tablet Take 1 tablet (40 mg total) by  mouth daily. 30 tablet 5  . fexofenadine (ALLEGRA) 180 MG tablet Take 180 mg by mouth daily.     No facility-administered medications prior to visit.      EXAM:  BP 110/78 (BP Location: Right Arm, Patient Position: Sitting, Cuff Size: Normal)   Temp 98.6 F (37 C) (Oral)   Wt 136 lb 6.4 oz (61.9 kg)   BMI 23.23 kg/m   Body mass index is 23.23 kg/m.  GENERAL: vitals reviewed and listed above, alert, oriented, appears well hydrated and in no acute distress HEENT: atraumatic, conjunctiva  clear, no obvious abnormalities on inspection of external nose and ears  NECK: no obvious masses on inspection palpation  LUNGS: clear to auscultation bilaterally, no wheezes, rales or rhonchi, good air movement Has scolisosis  Abdomen:  Sof,t normal bowel sounds without hepatosplenomegaly, no guarding rebound or masses  Mild right flank tenderness no midline No psoas sign good range of motion of hip and negative SLR or midline back tenderness. CV: HRRR, no clubbing cyanosis or  peripheral edema nl cap refill  MS: moves all extremities without noticeable  focal  abnormality PSYCH: pleasant and cooperative, no obvious depression or anxiety UA   Lab Results  Component Value Date   WBC 3.8 (L) 05/16/2016   HGB 12.5 05/16/2016   HCT 37.5 05/16/2016   PLT 218.0 05/16/2016   GLUCOSE 94 05/16/2016   CHOL 191 06/06/2015   TRIG 149.0 06/06/2015   HDL 42.30 06/06/2015   LDLDIRECT 152.9 06/26/2009   LDLCALC 119 (H) 06/06/2015   ALT 11 05/16/2016   AST 12 05/16/2016   NA 144 05/16/2016   K 3.8 05/16/2016   CL 108 05/16/2016   CREATININE 0.84 05/16/2016   BUN 24 (H) 05/16/2016   CO2 29 05/16/2016   TSH 0.74 06/06/2015   HGBA1C 6.4 03/17/2016    ASSESSMENT AND PLAN:  Discussed the following assessment and plan:  Right flank pain - Severity at onset consideration renal stone could have mechanical pain underlying has history of stones. Check lab and CT scan. - Plan: CMP, CBC with Differential/Platelet, POCT Urinalysis Dipstick (Automated), Sedimentation rate, CT RENAL STONE STUDY, CANCELED: CT ABDOMEN PELVIS WO CONTRAST  Personal history of kidney stones  Right-sided low back pain without sciatica  -Patient advised to return or notify health care team  if symptoms worsen ,persist or new concerns arise.  Patient Instructions  We are checking labs and urine indicative scan appear kidneys to check for kidney stones. In the short run increase fluids Tylenol naproxen are fine for pain as needed. If you need a stronger medicine over the weekend you can try a short-term narcotic as tolerated. Follow-up depending on scan and how you were doing. If increasing pain or fever may have to visit the emergency room over the weekend.   Standley Brooking. [name] M.D.  pateint lef before I could rx a pain medication  But she wasn't sure she needed to take it anyway .  Waiting on her scan results as of 5 pm today

## 2016-05-15 NOTE — Telephone Encounter (Signed)
Appt on 05/16/16 @ 9:15

## 2016-05-15 NOTE — Telephone Encounter (Signed)
Spoke to the pt.  Pain is better when she is up and moving.  More noticeable when she is still.  Informed her to go to the emergency room if pain is worsening or has a fever.  Otherwise, WP will see her on 05/16/16.

## 2016-05-15 NOTE — Telephone Encounter (Signed)
If pain is severe or she gets a fever she should go to the emergency room for evaluation. We can see her tomorrow but may not be able to get a scan to evaluate tomorrow.

## 2016-05-16 ENCOUNTER — Encounter: Payer: Self-pay | Admitting: Internal Medicine

## 2016-05-16 ENCOUNTER — Ambulatory Visit (INDEPENDENT_AMBULATORY_CARE_PROVIDER_SITE_OTHER): Payer: 59 | Admitting: Internal Medicine

## 2016-05-16 ENCOUNTER — Ambulatory Visit
Admission: RE | Admit: 2016-05-16 | Discharge: 2016-05-16 | Disposition: A | Discharge status: Home / Self Care | Payer: 59 | Source: Ambulatory Visit | Attending: Internal Medicine | Admitting: Internal Medicine

## 2016-05-16 VITALS — BP 110/78 | Temp 98.6°F | Wt 136.4 lb

## 2016-05-16 DIAGNOSIS — M545 Low back pain, unspecified: Secondary | ICD-10-CM

## 2016-05-16 DIAGNOSIS — R109 Unspecified abdominal pain: Secondary | ICD-10-CM

## 2016-05-16 DIAGNOSIS — Z87442 Personal history of urinary calculi: Secondary | ICD-10-CM

## 2016-05-16 LAB — CBC WITH DIFFERENTIAL/PLATELET
BASOS ABS: 0 10*3/uL (ref 0.0–0.1)
BASOS PCT: 0.6 % (ref 0.0–3.0)
EOS PCT: 0.8 % (ref 0.0–5.0)
Eosinophils Absolute: 0 10*3/uL (ref 0.0–0.7)
HEMATOCRIT: 37.5 % (ref 36.0–46.0)
Hemoglobin: 12.5 g/dL (ref 12.0–15.0)
LYMPHS PCT: 37 % (ref 12.0–46.0)
Lymphs Abs: 1.4 10*3/uL (ref 0.7–4.0)
MCHC: 33.3 g/dL (ref 30.0–36.0)
MCV: 93.3 fl (ref 78.0–100.0)
MONOS PCT: 7.1 % (ref 3.0–12.0)
Monocytes Absolute: 0.3 10*3/uL (ref 0.1–1.0)
NEUTROS ABS: 2.1 10*3/uL (ref 1.4–7.7)
Neutrophils Relative %: 54.5 % (ref 43.0–77.0)
PLATELETS: 218 10*3/uL (ref 150.0–400.0)
RBC: 4.01 Mil/uL (ref 3.87–5.11)
RDW: 12.6 % (ref 11.5–15.5)
WBC: 3.8 10*3/uL — ABNORMAL LOW (ref 4.0–10.5)

## 2016-05-16 LAB — SEDIMENTATION RATE: Sed Rate: 4 mm/hr (ref 0–30)

## 2016-05-16 LAB — COMPREHENSIVE METABOLIC PANEL
ALT: 11 U/L (ref 0–35)
AST: 12 U/L (ref 0–37)
Albumin: 3.9 g/dL (ref 3.5–5.2)
Alkaline Phosphatase: 61 U/L (ref 39–117)
BUN: 24 mg/dL — ABNORMAL HIGH (ref 6–23)
CALCIUM: 9 mg/dL (ref 8.4–10.5)
CHLORIDE: 108 meq/L (ref 96–112)
CO2: 29 meq/L (ref 19–32)
Creatinine, Ser: 0.84 mg/dL (ref 0.40–1.20)
GFR: 73.68 mL/min (ref >60.00)
Glucose, Bld: 94 mg/dL (ref 70–99)
POTASSIUM: 3.8 meq/L (ref 3.5–5.1)
Sodium: 144 mEq/L (ref 135–145)
Total Bilirubin: 0.7 mg/dL (ref 0.2–1.2)
Total Protein: 6.1 g/dL (ref 6.0–8.3)

## 2016-05-16 LAB — POC URINALSYSI DIPSTICK (AUTOMATED)
BILIRUBIN UA: NEGATIVE
Glucose, UA: NEGATIVE
KETONES UA: NEGATIVE
Leukocytes, UA: NEGATIVE
NITRITE UA: NEGATIVE
PH UA: 6.5
PROTEIN UA: NEGATIVE
SPEC GRAV UA: 1.01
Urobilinogen, UA: 0.2

## 2016-05-16 NOTE — Patient Instructions (Signed)
We are checking labs and urine indicative scan appear kidneys to check for kidney stones. In the short run increase fluids Tylenol naproxen are fine for pain as needed. If you need a stronger medicine over the weekend you can try a short-term narcotic as tolerated. Follow-up depending on scan and how you were doing. If increasing pain or fever may have to visit the emergency room over the weekend.

## 2016-05-19 ENCOUNTER — Inpatient Hospital Stay: Admission: RE | Admit: 2016-05-19 | Payer: 59 | Source: Ambulatory Visit

## 2016-05-19 ENCOUNTER — Ambulatory Visit (INDEPENDENT_AMBULATORY_CARE_PROVIDER_SITE_OTHER)
Admission: RE | Admit: 2016-05-19 | Discharge: 2016-05-19 | Disposition: A | Discharge status: Home / Self Care | Payer: 59 | Source: Ambulatory Visit | Attending: Internal Medicine | Admitting: Internal Medicine

## 2016-05-19 DIAGNOSIS — R109 Unspecified abdominal pain: Secondary | ICD-10-CM | POA: Diagnosis not present

## 2016-05-20 ENCOUNTER — Other Ambulatory Visit: Payer: Self-pay | Admitting: Family Medicine

## 2016-05-20 DIAGNOSIS — N200 Calculus of kidney: Secondary | ICD-10-CM

## 2016-05-21 ENCOUNTER — Telehealth: Payer: Self-pay | Admitting: Internal Medicine

## 2016-05-21 DIAGNOSIS — I2510 Atherosclerotic heart disease of native coronary artery without angina pectoris: Secondary | ICD-10-CM

## 2016-05-21 NOTE — Telephone Encounter (Signed)
Please refer as per patient  Preference

## 2016-05-21 NOTE — Telephone Encounter (Signed)
After reviewing mychart results and speaking with you, pt would like to see a heart specialist. Pt prefers and would like referral  to see Dr Rayann Heman at [facility].

## 2016-05-22 NOTE — Telephone Encounter (Signed)
Referral placed in the system.  Pt notified. 

## 2016-07-03 ENCOUNTER — Encounter: Payer: Self-pay | Admitting: *Deleted

## 2016-07-18 ENCOUNTER — Ambulatory Visit (INDEPENDENT_AMBULATORY_CARE_PROVIDER_SITE_OTHER): Payer: 59 | Admitting: Cardiology

## 2016-07-18 ENCOUNTER — Encounter: Payer: Self-pay | Admitting: Cardiology

## 2016-07-18 VITALS — BP 120/80 | HR 65 | Ht 64.75 in | Wt 132.7 lb

## 2016-07-18 DIAGNOSIS — I700 Atherosclerosis of aorta: Secondary | ICD-10-CM | POA: Diagnosis not present

## 2016-07-18 DIAGNOSIS — E782 Mixed hyperlipidemia: Secondary | ICD-10-CM

## 2016-07-18 DIAGNOSIS — I10 Essential (primary) hypertension: Secondary | ICD-10-CM

## 2016-07-18 DIAGNOSIS — E785 Hyperlipidemia, unspecified: Secondary | ICD-10-CM | POA: Diagnosis not present

## 2016-07-18 DIAGNOSIS — E1151 Type 2 diabetes mellitus with diabetic peripheral angiopathy without gangrene: Secondary | ICD-10-CM

## 2016-07-18 MED ORDER — ROSUVASTATIN CALCIUM 5 MG PO TABS: 5.0000 mg | ORAL_TABLET | Freq: Every day | ORAL | 3 refills | Status: DC

## 2016-07-18 NOTE — Patient Instructions (Signed)
Medication Instructions:   START TAKING ROSUVASTATIN 5 MG ONCE DAILY    Labwork:  ONE MONTH TO CHECK ---CMET AND FASTING LIPIDS---PLEASE COME FASTING TO THIS LAB APPOINTMENT    Follow-Up:  Your physician wants you to follow-up in: [place] will receive a reminder letter in the mail two months in advance. If you don't receive a letter, please call our office to schedule the follow-up appointment.        If you need a refill on your cardiac medications before your next appointment, please call your pharmacy.

## 2016-07-18 NOTE — Progress Notes (Signed)
Cardiology Office Note    Date:  07/18/2016   ID:  Valerie Gates, DOB 05/12/1956, MRN [id number]  PCP:  Lottie Dawson, MD  Cardiologist:   Ena Dawley, MD   Chief complain: Calcification on abdominal CT.  History of Present Illness:  Valerie Gates is a 60 y.o. female prior medical history of type 2 diabetes diagnosed a year ago, untreated hyperlipidemia and hypertension who had an abdominal CT for diagnosis of kidney stone and had an incidental finding of calcification in her abdominal aorta. I have personally reviewed her abdominal CT and there is mild_plaque in her abdominal aorta. No calcium was seen in the coronary arteries, however the most proximal portions are not visualized. The patient remains active she exercises you back once a week and walks almost every day, she has lost 20 pounds since last year when she was diagnosed with diabetes. She denies any chest pain or shortness of breath she has no palpitations, claudications no lower extremity edema dizziness or syncope. In her family her mother was diagnosed with atrial fibrillation, her grandfather had myocardial infarction in his 64s, he was a smoker.   Past Medical History:  Diagnosis Date  . Allergic rhinitis   . Allergy    seasonal  . Arthritis    lower right back   . Cancer ([place])    skin cancer-scalp  . History of skin cancer   . Hypertension     Past Surgical History:  Procedure Laterality Date  . COLONOSCOPY    . skin cancer removed on head      Current Medications: Outpatient Medications Prior to Visit  Medication Sig Dispense Refill  . fexofenadine (ALLEGRA) 180 MG tablet Take 180 mg by mouth daily as needed for allergies or rhinitis.     . fluticasone (FLONASE) 50 MCG/ACT nasal spray INHALE 2 SPRAYS IN EACH NOSTRIL DAILY 16 g 5  . LORazepam (ATIVAN) 0.5 MG tablet Take 1 tablet (0.5 mg total) by mouth 2 (two) times daily as needed for anxiety. Can take 2 if needed 30 tablet 0  . naproxen  (NAPROSYN) 500 MG tablet TAKE ONE TABLET BY MOUTH TWICE DAILY FOR BACK PAIN AS NEEDED.    Marland Kitchen olmesartan (BENICAR) 40 MG tablet Take 1 tablet (40 mg total) by mouth daily. 30 tablet 5  . fluticasone (FLONASE) 50 MCG/ACT nasal spray INHALE 2 SPRAYS IN EACH NOSTRIL DAILY 16 g 5   No facility-administered medications prior to visit.      Allergies:   Metformin and related   Social History   Social History  . Marital status: Divorced    Spouse name: N/A  . Number of children: N/A  . Years of education: N/A   Social History Main Topics  . Smoking status: Former Research scientist (life sciences)  . Smokeless tobacco: Never Used  . Alcohol use No  . Drug use: No  . Sexual activity: Not Asked   Other Topics Concern  . 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   ROS:   Please  see the history of present illness.    ROS All other systems reviewed and are negative.   PHYSICAL EXAM:   VS:  BP 120/80   Pulse 65   Ht 5' 4.75" (1.645 m)   Wt 132 lb 11.2 oz (60.2 kg)   LMP  (LMP Unknown)   BMI 22.25 kg/m    GEN: Well nourished, well developed, in no acute distress  HEENT: normal  Neck: no JVD, carotid bruits, or masses Cardiac: RRR; no murmurs, rubs, or gallops,no edema  Respiratory:  clear to auscultation bilaterally, normal work of breathing GI: soft, nontender, nondistended, + BS MS: no deformity or atrophy  Skin: warm and dry, no rash Neuro:  Alert and Oriented x 3, Strength and sensation are intact Psych: euthymic mood, full affect  Wt Readings from Last 3 Encounters:  07/18/16 132 lb 11.2 oz (60.2  kg)  05/16/16 136 lb 6.4 oz (61.9 kg)  03/24/16 135 lb 6.4 oz (61.4 kg)      Studies/Labs Reviewed:   EKG:  EKG is ordered today.  The ekg ordered today demonstrates Sinus rhythm, otherwise normal EKG.  Recent Labs: 05/16/2016: ALT 11; BUN 24; Creatinine, Ser 0.84; Hemoglobin 12.5; Platelets 218.0; Potassium 3.8; Sodium 144   Lipid Panel    Component Value Date/Time   CHOL 191 06/06/2015 1013   TRIG 149.0 06/06/2015 1013   HDL 42.30 06/06/2015 1013   CHOLHDL 5 06/06/2015 1013   VLDL 29.8 06/06/2015 1013   LDLCALC 119 (H) 06/06/2015 1013   LDLDIRECT 152.9 06/26/2009 1018    Additional studies/ records that were reviewed today include:    ASSESSMENT:    1. Hyperlipidemia, unspecified hyperlipidemia type   2. Aortic atherosclerosis ([place])   3. Essential hypertension   4. Mixed hyperlipidemia   5. 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 This is a 60 year old postmenopausal female with type 2 diabetes, hyperlipidemia LDL of 131 and evidence of atherosclerotic plaque in her aorta. She is completely asymptomatic and ischemic workup is not necessary. Per guidelines any patient with DM > 29 years old should be on a statin. We will start rosuvastatin 5 mg daily and check compressive metabolic profile and lipids in one month's. Follow-up in one year. 2. Her blood pressure is well controlled, I agree with olmesartan.    Medication Adjustments/Labs and Tests Ordered: Current medicines are reviewed at length with the patient today.  Concerns regarding medicines are outlined above.  Medication changes, Labs and Tests ordered today are listed in the Patient Instructions below. Patient Instructions  Medication Instructions:   START TAKING ROSUVASTATIN 5 MG ONCE DAILY    Labwork:  ONE MONTH TO CHECK ---CMET AND FASTING LIPIDS---PLEASE COME FASTING TO THIS LAB APPOINTMENT    Follow-Up:  Your physician  wants you to follow-up in: [place] will receive a reminder letter in the mail two months in advance. If you don't receive a letter, please call our office to schedule the follow-up appointment.        If you need a refill on your cardiac medications before your next appointment, please call your pharmacy.      Signed, Ena Dawley, MD  07/18/2016 11:09 AM    [address], [place], [place]  [address] Phone: [phone]; Fax: [phone]

## 2016-08-18 ENCOUNTER — Other Ambulatory Visit: Payer: 59 | Admitting: *Deleted

## 2016-08-18 DIAGNOSIS — E785 Hyperlipidemia, unspecified: Secondary | ICD-10-CM

## 2016-08-18 LAB — COMPREHENSIVE METABOLIC PANEL
ALT: 16 U/L (ref 6–29)
AST: 16 U/L (ref 10–35)
Albumin: 4 g/dL (ref 3.6–5.1)
Alkaline Phosphatase: 64 U/L (ref 33–130)
BUN: 22 mg/dL (ref 7–25)
CO2: 28 mmol/L (ref 20–31)
Calcium: 9 mg/dL (ref 8.6–10.4)
Chloride: 107 mmol/L (ref 98–110)
Creat: 0.8 mg/dL (ref 0.50–1.05)
Glucose, Bld: 123 mg/dL — ABNORMAL HIGH (ref 65–99)
Potassium: 3.7 mmol/L (ref 3.5–5.3)
Sodium: 143 mmol/L (ref 135–146)
Total Bilirubin: 0.6 mg/dL (ref 0.2–1.2)
Total Protein: 6.3 g/dL (ref 6.1–8.1)

## 2016-08-18 LAB — LIPID PANEL
Cholesterol: 129 mg/dL (ref <200)
HDL: 48 mg/dL — ABNORMAL LOW (ref >50)
LDL Cholesterol: 61 mg/dL (ref <100)
Total CHOL/HDL Ratio: 2.7 Ratio (ref <5.0)
Triglycerides: 101 mg/dL (ref <150)
VLDL: 20 mg/dL (ref <30)

## 2016-09-05 ENCOUNTER — Encounter: Payer: 59 | Admitting: Internal Medicine

## 2016-10-08 ENCOUNTER — Encounter: Payer: 59 | Admitting: Internal Medicine

## 2016-10-30 ENCOUNTER — Telehealth: Payer: Self-pay | Admitting: Internal Medicine

## 2016-10-30 NOTE — Telephone Encounter (Signed)
Called and let pt know of the change.

## 2016-10-30 NOTE — Telephone Encounter (Signed)
Ok to do  Just save 1-2 slots that am for sda use

## 2016-10-30 NOTE — Telephone Encounter (Signed)
Please advise 

## 2016-10-30 NOTE — Telephone Encounter (Signed)
FYI

## 2016-10-30 NOTE — Telephone Encounter (Signed)
Pt would like to come in on Apr 6 to have her CPE done in the morning is it okay to [place] two SDA's?

## 2016-11-26 ENCOUNTER — Encounter: Payer: 59 | Admitting: Internal Medicine

## 2016-12-01 DIAGNOSIS — Z01419 Encounter for gynecological examination (general) (routine) without abnormal findings: Secondary | ICD-10-CM | POA: Diagnosis not present

## 2016-12-01 DIAGNOSIS — N8111 Cystocele, midline: Secondary | ICD-10-CM | POA: Diagnosis not present

## 2016-12-01 DIAGNOSIS — Z6823 Body mass index (BMI) 23.0-23.9, adult: Secondary | ICD-10-CM | POA: Diagnosis not present

## 2016-12-01 DIAGNOSIS — N8110 Cystocele, unspecified: Secondary | ICD-10-CM | POA: Diagnosis not present

## 2016-12-04 NOTE — Progress Notes (Signed)
Chief Complaint  Patient presents with  . Annual Exam    HPI: Patient  Valerie Gates  61 y.o. comes in today for Preventive Health Care visit  And med evaluation  HT doing well Lipids NO se of meds Tries to see lsi  Has donated blood in modern times   Bf had hep C   Found on screening  Uses  loraz only as needed ocass  Rare  Needs new rx old  Few left    Health Maintenance  Topic Date Due  . Hepatitis C Screening  09-01-1956  . PNEUMOCOCCAL POLYSACCHARIDE VACCINE (1) 01/20/1959  . MAMMOGRAM  04/30/2015  . HEMOGLOBIN A1C  09/17/2016  . HIV Screening  12/05/2017 (Originally 01/20/1972)  . FOOT EXAM  03/24/2017  . INFLUENZA VACCINE  04/01/2017  . OPHTHALMOLOGY EXAM  04/23/2017  . COLONOSCOPY  04/27/2017  . PAP SMEAR  07/03/2017  . TETANUS/TDAP  10/14/2021   Health Maintenance Review LIFESTYLE:  Exercise:  Yoga and walking reg Tobacco/ETS:n Alcohol: n Sugar beverages:n Sleep: 10 - 6  Drug use: no HH of 2 plus golden  Work: 40 +  Gyne mammo and pap       ROS:  Has bladder prolapse  To be evaluated   GEN/ HEENT: No fever, significant weight changes sweats headaches vision problems hearing changes, CV/ PULM; No chest pain shortness of breath cough, syncope,edema  change in exercise tolerance. GI /GU: No adominal pain, vomiting, change in bowel habits. No blood in the stool. No significant GU symptoms. SKIN/HEME: ,no acute skin rashes suspicious lesions or bleeding. No lymphadenopathy, nodules, masses.  NEURO/ PSYCH:  No neurologic signs such as weakness numbness. No depression anxiety. IMM/ Allergy: No unusual infections.  Allergy .   REST of 12 system review negative except as per HPI   Past Medical History:  Diagnosis Date  . Allergic rhinitis   . Allergy    seasonal  . Arthritis    lower right back   . Cancer ([place])    skin cancer-scalp  . History of skin cancer   . Hypertension     Past Surgical History:  Procedure Laterality Date  . COLONOSCOPY      . skin cancer removed on head      Family History  Problem Relation Age of Onset  . Diabetes Other   . Hypertension Other   . Lung cancer Other   . Stroke Other     1st degree female <50  . Drug abuse Daughter   . Nephrolithiasis Daughter   . Colon polyps Mother   . Colon cancer Neg Hx   . Rectal cancer Neg Hx   . Stomach cancer Neg Hx     Social History   Social History  . Marital status: Divorced    Spouse name: N/A  . Number of children: N/A  . Years of education: N/A   Social History Main Topics  . Smoking status: Former Research scientist (life sciences)  . Smokeless tobacco: Never Used  . Alcohol use No  . Drug use: No  . Sexual activity: Not Asked   Other Topics Concern  . 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 fexofenadine (ALLEGRA) 180 MG tablet Take 180 mg by mouth daily as needed for allergies or rhinitis.     . fluticasone (FLONASE) 50 MCG/ACT nasal spray INHALE 2 SPRAYS IN EACH NOSTRIL DAILY 16 g 5  . LORazepam (ATIVAN) 0.5 MG tablet Take 1 tablet (0.5 mg total) by mouth 2 (two) times daily as needed for anxiety. Can take 2 if needed 30 tablet 0  . naproxen (NAPROSYN) 500 MG tablet TAKE ONE TABLET BY MOUTH TWICE DAILY FOR BACK PAIN AS NEEDED.    Marland Kitchen olmesartan (BENICAR) 40 MG tablet Take 1 tablet (40 mg total) by mouth daily. 30 tablet 5  . rosuvastatin (CRESTOR) 5 MG tablet Take 1 tablet (5 mg total) by mouth daily. 90 tablet 3   No facility-administered medications prior to visit.      EXAM:  BP 98/70 (BP Location: Right Arm, Patient Position: Sitting, Cuff Size: Normal)   Pulse 70   Temp 98 F (36.7 C) (Oral)   Ht 5' 4.5" (1.638 m)   Wt 136 lb 9.6 oz (62 kg)   LMP  (LMP Unknown)   BMI 23.09 kg/m   Body mass index is 23.09  kg/m. Wt Readings from Last 3 Encounters:  12/05/16 136 lb 9.6 oz (62 kg)  07/18/16 132 lb 11.2 oz (60.2 kg)  05/16/16 136 lb 6.4 oz (61.9 kg)    Physical Exam: Vital signs reviewed [id number] is a well-developed well-nourished alert cooperative    who appearsr stated age in no acute distress.  HEENT: normocephalic atraumatic , Eyes: PERRL EOM's full, conjunctiva clear, Nares: paten,t no deformity discharge or tenderness., Ears: no deformity EAC's clear TMs with normal landmarks. Mouth: clear OP, no lesions, edema.  Moist mucous membranes. Dentition in adequate repair. NECK: supple without masses, thyromegaly or bruits. CHEST/PULM:  Clear to auscultation and percussion breath sounds equal no wheeze , rales or rhonchi. No chest wall deformities or tenderness. Breast: normal by inspection . No dimpling, discharge, masses, tenderness or discharge . CV: PMI is nondisplaced, S1 S2 no gallops, murmurs, rubs. Peripheral pulses are full without delay.No JVD .  ABDOMEN: Bowel sounds normal nontender  No guard or rebound, no hepato splenomegal no CVA tenderness.  No hernia. Extremtities:  No clubbing cyanosis or edema, no acute joint swelling or redness no focal atrophy NEURO:  Oriented x3, cranial nerves 3-12 appear to be intact, no obvious focal weakness,gait within normal limits no abnormal reflexes or asymmetrical SKIN: No acute rashes normal turgor, color, no bruising or petechiae. PSYCH: Oriented, good eye contact, no obvious depression anxiety, cognition and judgment appear normal. LN: no cervical axillary inguinal adenopathy    BP Readings from Last 3 Encounters:  12/05/16 98/70  07/18/16 120/80  05/16/16 110/78   Wt Readings from Last 3 Encounters:  12/05/16 136 lb 9.6 oz (62 kg)  07/18/16 132 lb 11.2 oz (60.2 kg)  05/16/16 136 lb 6.4 oz (61.9 kg)    Lab results reviewed with patient   ASSESSMENT AND PLAN:  Discussed the following assessment and plan:  Visit for preventive  health examination - Plan: CBC with Differential/Platelet, TSH, Hemoglobin A1c, Comprehensive metabolic panel  Medication management - Plan: CBC with Differential/Platelet, TSH, Hemoglobin A1c, Comprehensive metabolic panel  Essential hypertension - Plan: CBC with Differential/Platelet, TSH, Hemoglobin A1c, Comprehensive metabolic panel  Pre-diabetes  Hyperlipidemia, unspecified hyperlipidemia type - Plan: rosuvastatin (CRESTOR) 5 MG tablet  Fasting hyperglycemia - Plan: CBC with Differential/Platelet, TSH, Hemoglobin A1c, Comprehensive metabolic panel  Allergic rhinitis, unspecified chronicity, unspecified seasonality, unspecified trigger metabolic monitoring   And for meds   PV   Lipid already done this year  Disc shingrix if interested  She declines  Flu vaccine Patient Care Team: Burnis Medin, MD as PCP - General Selinda Orion, MD (Inactive) (Obstetrics and Gynecology) Servando Salina, MD as Consulting Physician (Obstetrics and Gynecology) Patient Instructions  Continue lifestyle intervention healthy eating and exercise . Healthy lifestyle includes : At least 150 minutes of exercise weeks  , weight at healthy levels, which is usually   BMI 19-25. Avoid trans fats and processed foods;  Increase fresh fruits and veges to 5 servings per day. And avoid sweet beverages including tea and juice. Mediterranean diet with olive oil and nuts have been noted to be heart and brain healthy . Avoid tobacco products . Limit  alcohol to  7 per week for women and 14 servings for men.  Get adequate sleep . Wear seat belts . Don't text and drive .    Will send in refills of medications as discussed .   If all ok then check up and labs in a year .    Standley Brooking. Contrell Ballentine M.D.  Lab Results  Component Value Date   WBC 4.1 12/05/2016   HGB 13.1 12/05/2016   HCT 40.6 12/05/2016   PLT 218.0 12/05/2016   GLUCOSE 111 (H) 12/05/2016   CHOL 129 08/18/2016   TRIG 101 08/18/2016   HDL 48 (L)  08/18/2016   LDLDIRECT 152.9 06/26/2009   LDLCALC 61 08/18/2016   ALT 15 12/05/2016   AST 13 12/05/2016   NA 144 12/05/2016   K 4.0 12/05/2016   CL 109 12/05/2016   CREATININE 0.76 12/05/2016   BUN 21 12/05/2016   CO2 31 12/05/2016   TSH 1.23 12/05/2016   HGBA1C 6.8 (H) 12/05/2016  bg now in diabetic range early by a1c  But per diabetes for  Fasting  bg

## 2016-12-05 ENCOUNTER — Encounter: Payer: Self-pay | Admitting: Internal Medicine

## 2016-12-05 ENCOUNTER — Ambulatory Visit (INDEPENDENT_AMBULATORY_CARE_PROVIDER_SITE_OTHER): Payer: 59 | Admitting: Internal Medicine

## 2016-12-05 VITALS — BP 98/70 | HR 70 | Temp 98.0°F | Ht 64.5 in | Wt 136.6 lb

## 2016-12-05 DIAGNOSIS — R7301 Impaired fasting glucose: Secondary | ICD-10-CM

## 2016-12-05 DIAGNOSIS — Z0000 Encounter for general adult medical examination without abnormal findings: Secondary | ICD-10-CM | POA: Diagnosis not present

## 2016-12-05 DIAGNOSIS — Z79899 Other long term (current) drug therapy: Secondary | ICD-10-CM | POA: Diagnosis not present

## 2016-12-05 DIAGNOSIS — R7303 Prediabetes: Secondary | ICD-10-CM | POA: Diagnosis not present

## 2016-12-05 DIAGNOSIS — E785 Hyperlipidemia, unspecified: Secondary | ICD-10-CM

## 2016-12-05 DIAGNOSIS — J309 Allergic rhinitis, unspecified: Secondary | ICD-10-CM | POA: Diagnosis not present

## 2016-12-05 DIAGNOSIS — I10 Essential (primary) hypertension: Secondary | ICD-10-CM

## 2016-12-05 LAB — CBC WITH DIFFERENTIAL/PLATELET
BASOS ABS: 0 10*3/uL (ref 0.0–0.1)
Basophils Relative: 0.9 % (ref 0.0–3.0)
EOS ABS: 0.1 10*3/uL (ref 0.0–0.7)
Eosinophils Relative: 1.2 % (ref 0.0–5.0)
HEMATOCRIT: 40.6 % (ref 36.0–46.0)
HEMOGLOBIN: 13.1 g/dL (ref 12.0–15.0)
LYMPHS PCT: 37 % (ref 12.0–46.0)
Lymphs Abs: 1.5 10*3/uL (ref 0.7–4.0)
MCHC: 32.3 g/dL (ref 30.0–36.0)
MCV: 96.1 fl (ref 78.0–100.0)
Monocytes Absolute: 0.3 10*3/uL (ref 0.1–1.0)
Monocytes Relative: 8.1 % (ref 3.0–12.0)
NEUTROS ABS: 2.2 10*3/uL (ref 1.4–7.7)
Neutrophils Relative %: 52.8 % (ref 43.0–77.0)
PLATELETS: 218 10*3/uL (ref 150.0–400.0)
RBC: 4.23 Mil/uL (ref 3.87–5.11)
RDW: 13.2 % (ref 11.5–15.5)
WBC: 4.1 10*3/uL (ref 4.0–10.5)

## 2016-12-05 LAB — HEMOGLOBIN A1C: HEMOGLOBIN A1C: 6.8 % — AB (ref 4.6–6.5)

## 2016-12-05 LAB — COMPREHENSIVE METABOLIC PANEL
ALBUMIN: 4.3 g/dL (ref 3.5–5.2)
ALK PHOS: 63 U/L (ref 39–117)
ALT: 15 U/L (ref 0–35)
AST: 13 U/L (ref 0–37)
BUN: 21 mg/dL (ref 6–23)
CO2: 31 mEq/L (ref 19–32)
CREATININE: 0.76 mg/dL (ref 0.40–1.20)
Calcium: 9.1 mg/dL (ref 8.4–10.5)
Chloride: 109 mEq/L (ref 96–112)
GFR: 82.54 mL/min (ref >60.00)
GLUCOSE: 111 mg/dL — AB (ref 70–99)
POTASSIUM: 4 meq/L (ref 3.5–5.1)
SODIUM: 144 meq/L (ref 135–145)
TOTAL PROTEIN: 6.4 g/dL (ref 6.0–8.3)
Total Bilirubin: 0.5 mg/dL (ref 0.2–1.2)

## 2016-12-05 LAB — TSH: TSH: 1.23 u[IU]/mL (ref 0.35–4.50)

## 2016-12-05 MED ORDER — LORAZEPAM 0.5 MG PO TABS: 0.5000 mg | ORAL_TABLET | Freq: Two times a day (BID) | ORAL | 0 refills | Status: DC | PRN

## 2016-12-05 MED ORDER — FLUTICASONE PROPIONATE 50 MCG/ACT NA SUSP: 2.0000 | Freq: Every day | NASAL | 5 refills | Status: DC

## 2016-12-05 MED ORDER — ROSUVASTATIN CALCIUM 5 MG PO TABS: 5.0000 mg | ORAL_TABLET | Freq: Every day | ORAL | 3 refills | Status: DC

## 2016-12-05 MED ORDER — OLMESARTAN MEDOXOMIL 40 MG PO TABS: 40.0000 mg | ORAL_TABLET | Freq: Every day | ORAL | 3 refills | Status: DC

## 2016-12-05 MED ORDER — NAPROXEN 500 MG PO TABS: 500.0000 mg | ORAL_TABLET | Freq: Two times a day (BID) | ORAL | 0 refills | Status: DC

## 2016-12-05 NOTE — Patient Instructions (Signed)
Continue lifestyle intervention healthy eating and exercise . Healthy lifestyle includes : At least 150 minutes of exercise weeks  , weight at healthy levels, which is usually   BMI 19-25. Avoid trans fats and processed foods;  Increase fresh fruits and veges to 5 servings per day. And avoid sweet beverages including tea and juice. Mediterranean diet with olive oil and nuts have been noted to be heart and brain healthy . Avoid tobacco products . Limit  alcohol to  7 per week for women and 14 servings for men.  Get adequate sleep . Wear seat belts . Don't text and drive .    Will send in refills of medications as discussed .   If all ok then check up and labs in a year .

## 2017-01-12 ENCOUNTER — Other Ambulatory Visit: Payer: Self-pay | Admitting: Internal Medicine

## 2017-01-14 NOTE — Telephone Encounter (Signed)
Sent in

## 2017-01-21 ENCOUNTER — Encounter: Payer: 59 | Admitting: Internal Medicine

## 2017-01-23 ENCOUNTER — Other Ambulatory Visit: Payer: Self-pay | Admitting: Emergency Medicine

## 2017-01-23 ENCOUNTER — Telehealth: Payer: Self-pay | Admitting: Internal Medicine

## 2017-01-23 MED ORDER — LORAZEPAM 0.5 MG PO TABS: 0.5000 mg | ORAL_TABLET | Freq: Two times a day (BID) | ORAL | 0 refills | Status: DC | PRN

## 2017-01-23 NOTE — Telephone Encounter (Signed)
Called medication into pharmacy. 

## 2017-01-23 NOTE — Telephone Encounter (Signed)
Pt request refill  LORazepam (ATIVAN) 0.5 MG tablet   [id number] - [place], [place] - [address]   Pt made her follow up appt in August

## 2017-01-23 NOTE — Telephone Encounter (Signed)
Ok to refill x 1  

## 2017-01-23 NOTE — Telephone Encounter (Signed)
Please advise 

## 2017-03-23 NOTE — Progress Notes (Signed)
Chief Complaint  Patient presents with  . Follow-up    HPI: Valerie Gates 61 y.o. come in for Chronic disease management   Fu  Early dm   Trying  lsi  Not tolerated metformin  Here for fu  Starches  Potatoes etc   Ar challenging otheriwe ok  .  Cut out sweets etc  Walking  Yoga    No neuro  Or vision changes  No cv syncope  takign 20 mg benicar  ROS: See pertinent positives and negatives per HPI. Staying active   Past Medical History:  Diagnosis Date  . Allergic rhinitis   . Allergy    seasonal  . Arthritis    lower right back   . Cancer ([place])    skin cancer-scalp  . History of skin cancer   . Hypertension     Family History  Problem Relation Age of Onset  . Diabetes Other   . Hypertension Other   . Lung cancer Other   . Stroke Other        1st degree female <50  . Drug abuse Daughter   . Nephrolithiasis Daughter   . Colon polyps Mother   . Colon cancer Neg Hx   . Rectal cancer Neg Hx   . Stomach cancer Neg Hx     Social History   Social History  . Marital status: Divorced    Spouse name: N/A  . Number of children: N/A  . Years of education: N/A   Social History Main Topics  . Smoking status: Former Research scientist (life sciences)  . Smokeless tobacco: Never Used  . Alcohol use No  . Drug use: No  . Sexual activity: Not Asked   Other Topics Concern  . 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 fexofenadine (ALLEGRA) 180 MG tablet Take 180 mg by mouth daily as needed for allergies or rhinitis.     . fluticasone (FLONASE) 50 MCG/ACT nasal spray Place 2 sprays into both nostrils daily. 16 g 5  . LORazepam (ATIVAN) 0.5 MG tablet Take 1 tablet (0.5 mg total) by mouth 2 (two) times daily as needed for anxiety. Can take 2 if needed 30 tablet 0  .  naproxen (NAPROSYN) 500 MG tablet TAKE 1 TABLET BY MOUTH TWICE A DAY DAILY FOR BACK PAIN 60 tablet 0  . olmesartan (BENICAR) 40 MG tablet Take 1 tablet (40 mg total) by mouth daily. 90 tablet 3  . rosuvastatin (CRESTOR) 5 MG tablet Take 1 tablet (5 mg total) by mouth daily. 90 tablet 3   No facility-administered medications prior to visit.      EXAM:  BP 98/68 (BP Location: Right Arm, Patient Position: Sitting, Cuff Size: Normal)   Pulse 73   Temp 98.4 F (36.9 C) (Oral)   Wt 137 lb 14.4 oz (62.6 kg)   LMP  (LMP Unknown)   BMI 23.30 kg/m   Body mass index is 23.3 kg/m.  GENERAL: vitals reviewed and listed above, alert, oriented, appears well hydrated and in no acute distress HEENT: atraumatic, conjunctiva  clear, no obvious abnormalities on inspection of external nose and ears MS: moves all extremities without  noticeable focal  abnormality PSYCH: pleasant and cooperative, no obvious depression or anxiety Lab Results  Component Value Date   WBC 4.1 12/05/2016   HGB 13.1 12/05/2016   HCT 40.6 12/05/2016   PLT 218.0 12/05/2016   GLUCOSE 111 (H) 12/05/2016   CHOL 129 08/18/2016   TRIG 101 08/18/2016   HDL 48 (L) 08/18/2016   LDLDIRECT 152.9 06/26/2009   LDLCALC 61 08/18/2016   ALT 15 12/05/2016   AST 13 12/05/2016   NA 144 12/05/2016   K 4.0 12/05/2016   CL 109 12/05/2016   CREATININE 0.76 12/05/2016   BUN 21 12/05/2016   CO2 31 12/05/2016   TSH 1.23 12/05/2016   HGBA1C 6.3 03/24/2017   BP Readings from Last 3 Encounters:  03/24/17 98/68  12/05/16 98/70  07/18/16 120/80    ASSESSMENT AND PLAN:  Discussed the following assessment and plan:  Type 2 diabetes mellitus without complication, without long-term current use of insulin (HCC) - a1c down to 6.3  to keep up lsi  - Plan: POCT glycosylated hemoglobin (Hb A1C)  Medication management  Essential hypertension - bp good consider dec dose  She and I are Encouraged to continue lsi  And rov in about 4 mos check  a1c lipid when due    -Patient advised to return or notify health care team  if  new concerns arise.  Patient Instructions   Continue lifestyle intervention healthy eating and exercise . ROV   in 5-6 months      Lab at the visit        [place]. Sarayah Bacchi M.D.

## 2017-03-24 ENCOUNTER — Encounter: Payer: Self-pay | Admitting: Internal Medicine

## 2017-03-24 ENCOUNTER — Ambulatory Visit (INDEPENDENT_AMBULATORY_CARE_PROVIDER_SITE_OTHER): Payer: 59 | Admitting: Internal Medicine

## 2017-03-24 VITALS — BP 98/68 | HR 73 | Temp 98.4°F | Wt 137.9 lb

## 2017-03-24 DIAGNOSIS — Z79899 Other long term (current) drug therapy: Secondary | ICD-10-CM

## 2017-03-24 DIAGNOSIS — E119 Type 2 diabetes mellitus without complications: Secondary | ICD-10-CM

## 2017-03-24 DIAGNOSIS — I10 Essential (primary) hypertension: Secondary | ICD-10-CM

## 2017-03-24 LAB — POCT GLYCOSYLATED HEMOGLOBIN (HGB A1C): Hemoglobin A1C: 6.3

## 2017-03-24 NOTE — Patient Instructions (Addendum)
  Continue lifestyle intervention healthy eating and exercise . ROV   in 5-6 months      Lab at the visit

## 2017-04-15 DIAGNOSIS — N8111 Cystocele, midline: Secondary | ICD-10-CM | POA: Diagnosis not present

## 2017-05-19 ENCOUNTER — Telehealth: Payer: Self-pay | Admitting: Internal Medicine

## 2017-05-19 NOTE — Telephone Encounter (Signed)
Would you like for me to send in new rx for 20 mg tablet. Please advise.

## 2017-05-19 NOTE — Telephone Encounter (Signed)
Pt was last seen on 03-24-17 and was told md will decrease her generic benicar to 20 mg #30 w/refills. [facility] . Pt has finished benicar 40 mg

## 2017-05-20 ENCOUNTER — Other Ambulatory Visit: Payer: Self-pay | Admitting: Emergency Medicine

## 2017-05-20 ENCOUNTER — Encounter: Payer: Self-pay | Admitting: Internal Medicine

## 2017-05-20 MED ORDER — OLMESARTAN MEDOXOMIL 20 MG PO TABS: 20.0000 mg | ORAL_TABLET | Freq: Every day | ORAL | 3 refills | Status: DC

## 2017-05-20 NOTE — Telephone Encounter (Signed)
Medication for Benicar 20 mg has been sent to patient pharmacy with refills. Nothing further needed

## 2017-05-20 NOTE — Telephone Encounter (Signed)
Yes please send in  20 mg tabs benicar  Take 1  daily   Po or as directed  Disp 30  refill x 3    Goal BP is 120/80 or below

## 2017-05-22 ENCOUNTER — Encounter: Payer: Self-pay | Admitting: Internal Medicine

## 2017-07-08 DIAGNOSIS — N819 Female genital prolapse, unspecified: Secondary | ICD-10-CM | POA: Diagnosis not present

## 2017-07-27 DIAGNOSIS — N8501 Benign endometrial hyperplasia: Secondary | ICD-10-CM | POA: Diagnosis not present

## 2017-07-27 DIAGNOSIS — K660 Peritoneal adhesions (postprocedural) (postinfection): Secondary | ICD-10-CM | POA: Diagnosis not present

## 2017-07-27 DIAGNOSIS — N8111 Cystocele, midline: Secondary | ICD-10-CM | POA: Diagnosis not present

## 2017-07-28 ENCOUNTER — Ambulatory Visit: Payer: 59 | Admitting: Internal Medicine

## 2017-08-03 ENCOUNTER — Encounter: Payer: Self-pay | Admitting: Cardiology

## 2017-08-17 ENCOUNTER — Ambulatory Visit: Payer: 59 | Admitting: Cardiology

## 2017-09-04 ENCOUNTER — Ambulatory Visit: Payer: 59 | Admitting: Cardiology

## 2017-09-04 ENCOUNTER — Telehealth: Payer: Self-pay | Admitting: Cardiology

## 2017-09-04 ENCOUNTER — Encounter: Payer: Self-pay | Admitting: Cardiology

## 2017-09-04 VITALS — BP 100/82 | HR 64 | Ht 64.5 in | Wt 139.2 lb

## 2017-09-04 DIAGNOSIS — E785 Hyperlipidemia, unspecified: Secondary | ICD-10-CM

## 2017-09-04 DIAGNOSIS — E782 Mixed hyperlipidemia: Secondary | ICD-10-CM | POA: Diagnosis not present

## 2017-09-04 DIAGNOSIS — I700 Atherosclerosis of aorta: Secondary | ICD-10-CM | POA: Diagnosis not present

## 2017-09-04 DIAGNOSIS — I10 Essential (primary) hypertension: Secondary | ICD-10-CM | POA: Diagnosis not present

## 2017-09-04 LAB — COMPREHENSIVE METABOLIC PANEL
ALT: 19 IU/L (ref 0–32)
AST: 15 IU/L (ref 0–40)
Albumin/Globulin Ratio: 1.7 (ref 1.2–2.2)
Albumin: 4.3 g/dL (ref 3.6–4.8)
Alkaline Phosphatase: 81 IU/L (ref 39–117)
BUN/Creatinine Ratio: 20 (ref 12–28)
BUN: 16 mg/dL (ref 8–27)
Bilirubin Total: 0.6 mg/dL (ref 0.0–1.2)
CO2: 25 mmol/L (ref 20–29)
Calcium: 9.7 mg/dL (ref 8.7–10.3)
Chloride: 104 mmol/L (ref 96–106)
Creatinine, Ser: 0.79 mg/dL (ref 0.57–1.00)
GFR calc Af Amer: 94 mL/min/{1.73_m2} (ref >59)
GFR calc non Af Amer: 82 mL/min/{1.73_m2} (ref >59)
Globulin, Total: 2.5 g/dL (ref 1.5–4.5)
Glucose: 123 mg/dL — ABNORMAL HIGH (ref 65–99)
Potassium: 4.5 mmol/L (ref 3.5–5.2)
Sodium: 144 mmol/L (ref 134–144)
Total Protein: 6.8 g/dL (ref 6.0–8.5)

## 2017-09-04 LAB — LIPID PANEL
Chol/HDL Ratio: 2.8 ratio (ref 0.0–4.4)
Cholesterol, Total: 153 mg/dL (ref 100–199)
HDL: 54 mg/dL (ref >39)
LDL Calculated: 81 mg/dL (ref 0–99)
Triglycerides: 91 mg/dL (ref 0–149)
VLDL Cholesterol Cal: 18 mg/dL (ref 5–40)

## 2017-09-04 NOTE — Telephone Encounter (Signed)
Pt aware of normal labs per Dr Meda Coffee.  Pt verbalized understanding.

## 2017-09-04 NOTE — Telephone Encounter (Signed)
New message ° ° °Patient calling for lab results. Please call °

## 2017-09-04 NOTE — Patient Instructions (Signed)
Medication Instructions:   Your physician recommends that you continue on your current medications as directed. Please refer to the Current Medication list given to you today.   Labwork:  TODAY---CMET AND LIPIDS    Follow-Up:  Your physician wants you to follow-up in: ONE YEAR WITH DR NELSON You will receive a reminder letter in the mail two months in advance. If you don't receive a letter, please call our office to schedule the follow-up appointment.     If you need a refill on your cardiac medications before your next appointment, please call your pharmacy.   

## 2017-09-04 NOTE — Progress Notes (Signed)
Cardiology Office Note    Date:  09/04/2017   ID:  Valerie GRANDMAISON, DOB 01-20-1956, MRN [id number]  PCP:  Burnis Medin, MD  Cardiologist:   Ena Dawley, MD   Chief complain: Calcification on abdominal CT.  History of Present Illness:  Valerie Gates is a 62 y.o. female prior medical history of type 2 diabetes diagnosed a year ago, untreated hyperlipidemia and hypertension who had an abdominal CT for diagnosis of kidney stone and had an incidental finding of calcification in her abdominal aorta. I have personally reviewed her abdominal CT and there is mild_plaque in her abdominal aorta. No calcium was seen in the coronary arteries, however the most proximal portions are not visualized. The patient remains active she exercises you back once a week and walks almost every day, she has lost 20 pounds since last year when she was diagnosed with diabetes. She denies any chest pain or shortness of breath she has no palpitations, claudications no lower extremity edema dizziness or syncope. In her family her mother was diagnosed with atrial fibrillation, her grandfather had myocardial infarction in his 71s, he was a smoker.  09/04/2017 - the patient is coming after one year, she states that she feels absolutely perfect, she denies any chest pain, shortness of breath, exertional dizziness, falls no claudication palpitation or syncope. She has been compliant with her medications and she is able to tolerate rosuvastatin. She Response to that with on decreasing LDL. She walks daily with no side effects. She had blood work done in April 2018 with HbA1c 6.8%, after changing her diet eating more greens and remaining dietitian it decreased to 6.4%.  Past Medical History:  Diagnosis Date  . Allergic rhinitis   . Allergy    seasonal  . Arthritis    lower right back   . Cancer ([place])    skin cancer-scalp  . History of skin cancer   . Hypertension     Past Surgical History:  Procedure Laterality Date  .  COLONOSCOPY    . skin cancer removed on head      Current Medications: Outpatient Medications Prior to Visit  Medication Sig Dispense Refill  . fexofenadine (ALLEGRA) 180 MG tablet Take 180 mg by mouth daily as needed for allergies or rhinitis.     . fluticasone (FLONASE) 50 MCG/ACT nasal spray Place 2 sprays into both nostrils daily. 16 g 5  . LORazepam (ATIVAN) 0.5 MG tablet Take 1 tablet (0.5 mg total) by mouth 2 (two) times daily as needed for anxiety. Can take 2 if needed 30 tablet 0  . naproxen (NAPROSYN) 500 MG tablet TAKE 1 TABLET BY MOUTH TWICE A DAY DAILY FOR BACK PAIN 60 tablet 0  . olmesartan (BENICAR) 20 MG tablet Take 1 tablet (20 mg total) by mouth daily. 30 tablet 3  . rosuvastatin (CRESTOR) 5 MG tablet Take 1 tablet (5 mg total) by mouth daily. 90 tablet 3   No facility-administered medications prior to visit.      Allergies:   Metformin and Metformin and related   Social History   Socioeconomic History  . Marital status: Divorced    Spouse name: None  . Number of children: None  . Years of education: None  . Highest education level: None  Social Needs  . Financial resource strain: None  . Food insecurity - worry: None  . Food insecurity - inability: None  . Transportation needs - medical: None  . Transportation needs - non-medical: None  Occupational History  . None  Tobacco Use  . Smoking status: Former Research scientist (life sciences)  . Smokeless tobacco: Never Used  Substance and Sexual Activity  . Alcohol use: No  . Drug use: No  . Sexual activity: None  Other Topics Concern  . 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   ROS:   Please see the history of present illness.    ROS All other systems reviewed and are negative.   PHYSICAL EXAM:   VS:  BP 100/82   Pulse 64   Ht 5' 4.5" (1.638 m)   Wt 139 lb 3.2 oz (63.1 kg)   LMP  (LMP Unknown)   SpO2 97%   BMI 23.52 kg/m    GEN: Well nourished, well developed, in no acute distress  HEENT: normal  Neck: no JVD, carotid bruits, or masses Cardiac: RRR; no murmurs, rubs, or gallops,no edema  Respiratory:  clear to auscultation bilaterally, normal work of breathing GI: soft, nontender, nondistended, + BS MS: no deformity or atrophy  Skin: warm and dry, no rash Neuro:  Alert and Oriented x 3, Strength and sensation are intact Psych: euthymic mood, full affect  Wt Readings from Last 3 Encounters:  09/04/17 139 lb 3.2 oz (63.1 kg)  03/24/17 137 lb 14.4 oz (62.6 kg)  12/05/16 136 lb 9.6 oz (62 kg)    Studies/Labs Reviewed:   EKG:  EKG is ordered today and it shows normal sinus rhythm normal EKG, unchanged from prior. This was personally reviewed.  Recent Labs: 12/05/2016: ALT 15; BUN 21; Creatinine, Ser 0.76; Hemoglobin 13.1; Platelets 218.0; Potassium 4.0; Sodium 144; TSH 1.23   Lipid Panel    Component Value Date/Time   CHOL 129 08/18/2016 1206   TRIG 101 08/18/2016 1206   HDL 48 (L) 08/18/2016 1206   CHOLHDL 2.7 08/18/2016 1206   VLDL 20 08/18/2016 1206   LDLCALC 61 08/18/2016 1206   LDLDIRECT 152.9 06/26/2009 1018    Additional studies/ records that were reviewed today include:    ASSESSMENT:    1. Hyperlipidemia, unspecified hyperlipidemia type   2. Essential hypertension   3. Mixed hyperlipidemia   4. Aortic atherosclerosis (HCC)      PLAN:  In order of problems listed above:  1. This is a 62 year old postmenopausal female with type 2 diabetes, hyperlipidemia LDL of 131 and evidence of atherosclerotic plaque in her aorta. She is completely asymptomatic and ischemic workup is  not necessary. Per guidelines any patient with DM > 97 years old should be on a statin. We started rosuvastatin 5 mg daily that lead to significant increase in LDL cholesterol and she is tolerating it well. We will recheck lipids and CMP today.  2. Hyperlipidemia  - as above 3. Hypertension - well controlled.  Medication Adjustments/Labs and Tests Ordered: Current medicines are reviewed at length with the patient today.  Concerns regarding medicines are outlined above.  Medication changes, Labs and Tests ordered today are listed in the Patient Instructions below. Patient Instructions  Medication  Instructions:   Your physician recommends that you continue on your current medications as directed. Please refer to the Current Medication list given to you today.'   Labwork:  TODAY--CMET AND LIPIDS     Follow-Up:  Your physician wants you to follow-up in: [place] will receive a reminder letter in the mail two months in advance. If you don't receive a letter, please call our office to schedule the follow-up appointment.        If you need a refill on your cardiac medications before your next appointment, please call your pharmacy.      Signed, Ena Dawley, MD  09/04/2017 10:12 AM    [facility] [address], [place], [place]  [address] Phone: [phone]; Fax: [phone]

## 2017-10-13 ENCOUNTER — Encounter: Payer: Self-pay | Admitting: Internal Medicine

## 2017-12-11 ENCOUNTER — Encounter: Payer: 59 | Admitting: Internal Medicine

## 2017-12-19 ENCOUNTER — Other Ambulatory Visit: Payer: Self-pay | Admitting: Internal Medicine

## 2017-12-23 NOTE — Telephone Encounter (Signed)
Patient returning a call. Advised patient that an appointment is needed before medication will be sent to the pharmacy. Medication follow up with Dr Renato Shin has been schedule for 12/28/17 at 3:00pm

## 2017-12-23 NOTE — Telephone Encounter (Signed)
Patient is overdue for follow-up w/ Dr. Regis Bill.  Called patient and left message to return call to schedule appt.

## 2017-12-24 NOTE — Telephone Encounter (Signed)
Pt is calling and checking on refill request. She states as soon as she made the appt she was told it would be called in.

## 2017-12-24 NOTE — Telephone Encounter (Signed)
Okay to fill?  Patient has an appointment 12/28/17.

## 2017-12-25 NOTE — Progress Notes (Signed)
Chief Complaint  Patient presents with  . Follow-up    f/u from 03/2017 visit. No new concerns    HPI: Valerie Gates 62 y.o. come in for Chronic disease management    And  meds   delayed because of  A number or reasons    Bladder  Surgery   In November and then  Tacking  And partial hysterectomy  Doing ok   BP    Taking   20 mg ( 1/2 40 )   ? iof can go down to 10 but had the 40 refilled   DM: not checking .  Now had been good but  stressfully year no vision or numbness   Lipids   Plaque no cv sx  On lipid med sees cardiology   Needs refill anxiety  Med   Brother 80 had  MI  sudden death in 12/25/2022   And sis  has cancer pancreatic    Battling   sometimes panic  Asks for refill lorazepam last given 30 last  May June   [place] and [place] ok   Will get mammo   Planned colonoscopy eye check     ROS: See pertinent positives and negatives per HPI. No cp sob  Denies depression  Past Medical History:  Diagnosis Date  . Allergic rhinitis   . Allergy    seasonal  . Arthritis    lower right back   . Cancer ([place])    skin cancer-scalp  . History of skin cancer   . Hypertension     Family History  Problem Relation Age of Onset  . Diabetes Other   . Hypertension Other   . Lung cancer Other   . Stroke Other        1st degree female <50  . Drug abuse Daughter   . Nephrolithiasis Daughter   . Colon polyps Mother   . Colon cancer Neg Hx   . Rectal cancer Neg Hx   . Stomach cancer Neg Hx     Social History   Socioeconomic History  . Marital status: Divorced    Spouse name: Not on file  . Number of children: Not on file  . Years of education: Not on file  . Highest education level: Not on file  Occupational History  . Not on file  Social Needs  . Financial resource strain: Not on file  . Food insecurity:    Worry: Not on file    Inability: Not on file  . Transportation needs:    Medical: Not on file    Non-medical: Not on file  Tobacco Use  . Smoking status:  Former Research scientist (life sciences)  . Smokeless tobacco: Never Used  Substance and Sexual Activity  . Alcohol use: No  . Drug use: No  . Sexual activity: Not on file  Lifestyle  . Physical activity:    Days per week: Not on file    Minutes per session: Not on file  . Stress: Not on file  Relationships  . Social connections:    Talks on phone: Not on file    Gets together: Not on file    Attends religious service: Not on file    Active member of club or organization: Not on file    Attends meetings of clubs or organizations: Not on file    Relationship status: Not on file  Other Topics Concern  . 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 fexofenadine (ALLEGRA) 180 MG tablet Take 180 mg by mouth daily as needed for allergies or rhinitis.     . fluticasone (FLONASE) 50 MCG/ACT nasal spray PLACE 2 SPRAYS INTO BOTH NOSTRILS DAILY. 16 g 0  . naproxen (NAPROSYN) 500 MG tablet TAKE 1 TABLET BY MOUTH TWICE A DAY DAILY FOR BACK PAIN 60 tablet 0  . olmesartan (BENICAR) 40 MG tablet TAKE 1 TABLET (40 MG TOTAL) BY MOUTH DAILY. 30 tablet 0  . LORazepam (ATIVAN) 0.5 MG tablet Take 1 tablet (0.5 mg total) by mouth 2 (two) times daily as needed for anxiety. Can take 2 if needed 30 tablet 0  . olmesartan (BENICAR) 20 MG tablet Take 1 tablet (20 mg total) by mouth daily. 30 tablet 3  . rosuvastatin (CRESTOR) 5 MG tablet Take 1 tablet (5 mg total) by mouth daily. 90 tablet 3   No facility-administered medications prior to visit.      EXAM:  BP 102/68 (BP Location: Right Arm, Patient Position: Sitting, Cuff Size: Normal)   Pulse 81   Temp 97.9 F (36.6 C) (Oral)   Wt 140 lb 4.8 oz (63.6 kg)   LMP  (LMP Unknown)   BMI 23.71 kg/m   Body mass index is 23.71  kg/m.  GENERAL: vitals reviewed and listed above, alert, oriented, appears well hydrated and in no acute distress HEENT: atraumatic, conjunctiva  clear, no obvious abnormalities on inspection of external nose and ears OP : no lesion edema or exudate  NECK: no obvious masses on inspection palpation  LUNGS: clear to auscultation bilaterally, no wheezes, rales or rhonchi, good air movement CV: HRRR, no clubbing cyanosis or  peripheral edema nl cap refill  Abdomen:  Sof,t normal bowel sounds without hepatosplenomegaly, no guarding rebound or masses no CVA tenderness MS: moves all extremities without noticeable focal  abnormality PSYCH: pleasant and cooperative, no obvious depression or anxiety Lab Results  Component Value Date   WBC 4.1 12/05/2016   HGB 13.1 12/05/2016   HCT 40.6 12/05/2016   PLT 218.0 12/05/2016   GLUCOSE 123 (H) 09/04/2017   CHOL 153 09/04/2017   TRIG 91 09/04/2017   HDL 54 09/04/2017   LDLDIRECT 152.9 06/26/2009   LDLCALC 81 09/04/2017   ALT 19 09/04/2017   AST 15 09/04/2017   NA 144 09/04/2017   K 4.5 09/04/2017   CL 104 09/04/2017   CREATININE 0.79 09/04/2017   BUN 16 09/04/2017   CO2 25 09/04/2017   TSH 1.23 12/05/2016   HGBA1C 6.3 03/24/2017   BP Readings from Last 3 Encounters:  12/28/17 102/68  09/04/17 100/82  03/24/17 98/68    ASSESSMENT AND PLAN:  Discussed the following assessment and plan:  Type 2 diabetes mellitus without complication, without long-term current use of insulin ([place]) - Plan: Basic metabolic panel, Hemoglobin A1c, CBC with Differential/Platelet, TSH, Microalbumin / creatinine urine ratio  Medication management - Plan: Basic metabolic panel, Hemoglobin A1c, CBC with Differential/Platelet, TSH  Essential hypertension - Plan: Basic metabolic panel, Hemoglobin A1c, CBC with Differential/Platelet, TSH  Atherosclerosis of native coronary artery of native heart without angina pectoris - Plan: Basic metabolic panel, Hemoglobin A1c,  CBC with Differential/Platelet, TSH  23-polyvalent pneumococcal  polysaccharide vaccine declined today can do a nother time - today   Stress  Situational anxiety Declined  pneumovax today   Can get later    Expectant management. Caution with meds  Using correctly .  Plan rov in 6 mos if all ok  Today and plan fu  reviewed .hcm  -Patient advised to return or notify health care team  if  new concerns arise.  Patient Instructions  When due for refill       And we can decrease the benicar  To 20 mg per day and take 10 mg per day .  Of bp controlled  Goal is 120/80  and below   Will notify you  of labs when available.    If all ok then plan  6 months .     Standley [place]. Edrees Valent M.D.

## 2017-12-25 NOTE — Telephone Encounter (Signed)
Pt is scheduled for a med F/U appointment with Dr Regis Bill, Refill have been sent to pt pharmacy enough to last pt until her Appointment.

## 2017-12-28 ENCOUNTER — Ambulatory Visit (INDEPENDENT_AMBULATORY_CARE_PROVIDER_SITE_OTHER): Payer: 59 | Admitting: Internal Medicine

## 2017-12-28 ENCOUNTER — Encounter: Payer: Self-pay | Admitting: Internal Medicine

## 2017-12-28 VITALS — BP 102/68 | HR 81 | Temp 97.9°F | Wt 140.3 lb

## 2017-12-28 DIAGNOSIS — F418 Other specified anxiety disorders: Secondary | ICD-10-CM | POA: Diagnosis not present

## 2017-12-28 DIAGNOSIS — I2510 Atherosclerotic heart disease of native coronary artery without angina pectoris: Secondary | ICD-10-CM | POA: Diagnosis not present

## 2017-12-28 DIAGNOSIS — Z79899 Other long term (current) drug therapy: Secondary | ICD-10-CM

## 2017-12-28 DIAGNOSIS — E119 Type 2 diabetes mellitus without complications: Secondary | ICD-10-CM | POA: Diagnosis not present

## 2017-12-28 DIAGNOSIS — F439 Reaction to severe stress, unspecified: Secondary | ICD-10-CM

## 2017-12-28 DIAGNOSIS — Z2821 Immunization not carried out because of patient refusal: Secondary | ICD-10-CM | POA: Diagnosis not present

## 2017-12-28 DIAGNOSIS — I10 Essential (primary) hypertension: Secondary | ICD-10-CM

## 2017-12-28 MED ORDER — OLMESARTAN MEDOXOMIL 20 MG PO TABS: 20.0000 mg | ORAL_TABLET | Freq: Every day | ORAL | 1 refills | Status: DC

## 2017-12-28 MED ORDER — LORAZEPAM 0.5 MG PO TABS: 0.5000 mg | ORAL_TABLET | Freq: Two times a day (BID) | ORAL | 0 refills | Status: DC | PRN

## 2017-12-28 NOTE — Patient Instructions (Addendum)
When due for refill       And we can decrease the benicar  To 20 mg per day and take 10 mg per day .  Of bp controlled  Goal is 120/80  and below   Will notify you  of labs when available.    If all ok then plan  6 months .

## 2017-12-29 LAB — MICROALBUMIN / CREATININE URINE RATIO
Creatinine,U: 132.2 mg/dL
Microalb Creat Ratio: 0.6 mg/g (ref 0.0–30.0)
Microalb, Ur: 0.8 mg/dL (ref 0.0–1.9)

## 2017-12-29 LAB — BASIC METABOLIC PANEL
BUN: 18 mg/dL (ref 6–23)
CO2: 29 meq/L (ref 19–32)
Calcium: 9.3 mg/dL (ref 8.4–10.5)
Chloride: 107 mEq/L (ref 96–112)
Creatinine, Ser: 0.98 mg/dL (ref 0.40–1.20)
GFR: 61.33 mL/min (ref >60.00)
GLUCOSE: 156 mg/dL — AB (ref 70–99)
POTASSIUM: 3.9 meq/L (ref 3.5–5.1)
Sodium: 144 mEq/L (ref 135–145)

## 2017-12-29 LAB — CBC WITH DIFFERENTIAL/PLATELET
BASOS ABS: 0 10*3/uL (ref 0.0–0.1)
BASOS PCT: 0.3 % (ref 0.0–3.0)
Eosinophils Absolute: 0 10*3/uL (ref 0.0–0.7)
Eosinophils Relative: 0.8 % (ref 0.0–5.0)
HEMATOCRIT: 38.4 % (ref 36.0–46.0)
HEMOGLOBIN: 13 g/dL (ref 12.0–15.0)
LYMPHS PCT: 38.7 % (ref 12.0–46.0)
Lymphs Abs: 2.1 10*3/uL (ref 0.7–4.0)
MCHC: 33.8 g/dL (ref 30.0–36.0)
MCV: 94.8 fl (ref 78.0–100.0)
MONOS PCT: 7.9 % (ref 3.0–12.0)
Monocytes Absolute: 0.4 10*3/uL (ref 0.1–1.0)
NEUTROS ABS: 2.9 10*3/uL (ref 1.4–7.7)
Neutrophils Relative %: 52.3 % (ref 43.0–77.0)
Platelets: 215 10*3/uL (ref 150.0–400.0)
RBC: 4.05 Mil/uL (ref 3.87–5.11)
RDW: 13.1 % (ref 11.5–15.5)
WBC: 5.6 10*3/uL (ref 4.0–10.5)

## 2017-12-29 LAB — TSH: TSH: 1.16 u[IU]/mL (ref 0.35–4.50)

## 2017-12-29 LAB — HEMOGLOBIN A1C: Hgb A1c MFr Bld: 7.1 % — ABNORMAL HIGH (ref 4.6–6.5)

## 2017-12-30 ENCOUNTER — Telehealth: Payer: Self-pay | Admitting: Internal Medicine

## 2017-12-30 DIAGNOSIS — E119 Type 2 diabetes mellitus without complications: Secondary | ICD-10-CM

## 2017-12-30 NOTE — Telephone Encounter (Signed)
Copied from [place]. Topic: Quick Communication - See Telephone Encounter >> Dec 30, 2017 10:22 AM Rutherford Nail, NT wrote: CRM for notification. See Telephone encounter for: 12/30/17. Patient calling and states she is ready to start raking the Diabetes medication that Dr Regis Bill had recommended. States that she want Dr Regis Bill to decide which option would be better for her and give her a call to let her know. CB#: [phone]

## 2017-12-30 NOTE — Telephone Encounter (Signed)
Please advise Dr Regis Bill, are you still wanting to do Jardiance over [place]? Thanks.   Notes recorded by Burnis Medin, MD on 12/29/2017 at 4:43 PM EDT a1c is up as You thought might be . Since you cant take metformin  We can add A medication such as jardiance or farxiga  That helps excrete sugar in the urine   If agree rx jardiance 10 mg per day Disp 30 Refill x 34  And plan repeat  Hg a1c   And rov in 4-5 months   Rest of blood work is Stable  Continue Attention Now to lifestyle intervention healthy eating and exercise .

## 2017-12-31 ENCOUNTER — Other Ambulatory Visit: Payer: Self-pay | Admitting: Internal Medicine

## 2017-12-31 DIAGNOSIS — E785 Hyperlipidemia, unspecified: Secondary | ICD-10-CM

## 2017-12-31 MED ORDER — EMPAGLIFLOZIN 10 MG PO TABS: 10.0000 mg | ORAL_TABLET | Freq: Every day | ORAL | 3 refills | Status: DC

## 2017-12-31 NOTE — Telephone Encounter (Signed)
Pt aware of rec's per Dr Regis Bill Pt scheduled 05/07/18 at [facility] to come in and get labs prior (few days before) Labs ordered.  Pt to let us know if there are any issues getting her [name] so that there is no delay in starting this medication.  Nothing further needed.

## 2017-12-31 NOTE — Telephone Encounter (Signed)
Sending in medication to take daily . Make sure check for manufacturer coupons on line  . (If not covered by insurance ask  Pharmacist which similar med is preferred  )  Med Can lower BP also  So if getting  Too low bps ( below 100)  We can decrease the benicar  Dose   Plan bmp and hg a1c pre visit in 4 months  Dx DM

## 2018-01-22 ENCOUNTER — Other Ambulatory Visit: Payer: Self-pay | Admitting: Internal Medicine

## 2018-02-01 ENCOUNTER — Encounter: Payer: 59 | Admitting: Internal Medicine

## 2018-02-09 ENCOUNTER — Other Ambulatory Visit: Payer: Self-pay

## 2018-02-25 ENCOUNTER — Other Ambulatory Visit: Payer: Self-pay

## 2018-02-25 ENCOUNTER — Ambulatory Visit (AMBULATORY_SURGERY_CENTER): Payer: 59

## 2018-02-25 VITALS — Ht 65.0 in | Wt 140.0 lb

## 2018-02-25 DIAGNOSIS — Z1211 Encounter for screening for malignant neoplasm of colon: Secondary | ICD-10-CM

## 2018-02-25 NOTE — Progress Notes (Signed)
Denies allergies to eggs or soy products. Denies complication of anesthesia or sedation. Denies use of weight loss medication. Denies use of O2.   Emmi instructions declined.  

## 2018-03-01 ENCOUNTER — Encounter: Payer: Self-pay | Admitting: Internal Medicine

## 2018-03-10 ENCOUNTER — Other Ambulatory Visit: Payer: Self-pay

## 2018-03-10 ENCOUNTER — Ambulatory Visit (AMBULATORY_SURGERY_CENTER): Payer: 59 | Admitting: Internal Medicine

## 2018-03-10 ENCOUNTER — Encounter: Payer: Self-pay | Admitting: Internal Medicine

## 2018-03-10 VITALS — BP 102/70 | HR 63 | Temp 97.8°F | Resp 12 | Ht 65.0 in | Wt 140.0 lb

## 2018-03-10 DIAGNOSIS — Z1211 Encounter for screening for malignant neoplasm of colon: Secondary | ICD-10-CM

## 2018-03-10 DIAGNOSIS — D120 Benign neoplasm of cecum: Secondary | ICD-10-CM | POA: Diagnosis not present

## 2018-03-10 MED ORDER — SODIUM CHLORIDE 0.9 % IV SOLN: 500.0000 mL | Freq: Once | INTRAVENOUS | Status: DC

## 2018-03-10 NOTE — Progress Notes (Signed)
Pt's states no medical or surgical changes since previsit or office visit. 

## 2018-03-10 NOTE — Progress Notes (Signed)
A and O x3. Report to RN. Tolerated MAC anesthesia well.

## 2018-03-10 NOTE — Patient Instructions (Addendum)
I did find and removed one small polyp that looks benign. I will let you know pathology results and when to have another routine colonoscopy by mail and/or My Chart.  You also have a condition called diverticulosis - common and not usually a problem. Please read the handout provided.  I appreciate the opportunity to care for you. Gatha Mayer, MD, FACG   YOU HAD AN ENDOSCOPIC PROCEDURE TODAY AT [facility]:   Refer to the procedure report that was given to you for any specific questions about what was found during the examination.  If the procedure report does not answer your questions, please call your gastroenterologist to clarify.  If you requested that your care partner not be given the details of your procedure findings, then the procedure report has been included in a sealed envelope for you to review at your convenience later.  YOU SHOULD EXPECT: Some feelings of bloating in the abdomen. Passage of more gas than usual.  Walking can help get rid of the air that was put into your GI tract during the procedure and reduce the bloating. If you had a lower endoscopy (such as a colonoscopy or flexible sigmoidoscopy) you may notice spotting of blood in your stool or on the toilet paper. If you underwent a bowel prep for your procedure, you may not have a normal bowel movement for a few days.  Please Note:  You might notice some irritation and congestion in your nose or some drainage.  This is from the oxygen used during your procedure.  There is no need for concern and it should clear up in a day or so.  SYMPTOMS TO REPORT IMMEDIATELY:   Following lower endoscopy (colonoscopy or flexible sigmoidoscopy):  Excessive amounts of blood in the stool  Significant tenderness or worsening of abdominal pains  Swelling of the abdomen that is new, acute  Fever of 100F or higher   For urgent or emergent issues, a gastroenterologist can be reached at any hour by calling [phone]  [phone].   DIET:  We do recommend a small meal at first, but then you may proceed to your regular diet.  Drink plenty of fluids but you should avoid alcoholic beverages for 24 hours.  ACTIVITY:  You should plan to take it easy for the rest of today and you should NOT DRIVE or use heavy machinery until tomorrow (because of the sedation medicines used during the test).    FOLLOW UP: Our staff will call the number listed on your records the next business day following your procedure to check on you and address any questions or concerns that you may have regarding the information given to you following your procedure. If we do not reach you, we will leave a message.  However, if you are feeling well and you are not experiencing any problems, there is no need to return our call.  We will assume that you have returned to your regular daily activities without incident.  If any biopsies were taken you will be contacted by phone or by letter within the next 1-3 weeks.  Please call us at [phone] if you have not heard about the biopsies in 3 weeks.    SIGNATURES/CONFIDENTIALITY: You and/or your care partner have signed paperwork which will be entered into your electronic medical record.  These signatures attest to the fact that that the information above on your After Visit Summary has been reviewed and is understood.  Full responsibility of the  confidentiality of this discharge information lies with you and/or your care-partner.  Polyp and diverticulosis information given.

## 2018-03-10 NOTE — Op Note (Signed)
[facility] Patient Name: Valerie Gates Procedure Date: 03/10/2018 1:29 PM MRN: [id number] Endoscopist: Gatha Mayer , MD Age: 62 Referring MD:  Date of Birth: 1955-09-03 Gender: Female Account #: [id number] Procedure:                Colonoscopy Indications:              Screening for colorectal malignant neoplasm, Last                            colonoscopy: 2008 Medicines:                Propofol per Anesthesia, Monitored Anesthesia Care Procedure:                Pre-Anesthesia Assessment:                           - Prior to the procedure, a History and Physical                            was performed, and patient medications and                            allergies were reviewed. The patient's tolerance of                            previous anesthesia was also reviewed. The risks                            and benefits of the procedure and the sedation                            options and risks were discussed with the patient.                            All questions were answered, and informed consent                            was obtained. Prior Anticoagulants: The patient has                            taken no previous anticoagulant or antiplatelet                            agents. ASA Grade Assessment: II - A patient with                            mild systemic disease. After reviewing the risks                            and benefits, the patient was deemed in                            satisfactory condition to undergo the procedure.  After obtaining informed consent, the colonoscope                            was passed under direct vision. Throughout the                            procedure, the patient's blood pressure, pulse, and                            oxygen saturations were monitored continuously. The                            Colonoscope was introduced through the anus and                            advanced to the the  cecum, identified by                            appendiceal orifice and ileocecal valve. The                            colonoscopy was performed without difficulty. The                            patient tolerated the procedure well. The quality                            of the bowel preparation was good. The bowel                            preparation used was Miralax. The ileocecal valve,                            appendiceal orifice, and rectum were photographed. Scope In: 1:41:48 PM Scope Out: 2:02:34 PM Scope Withdrawal Time: 0 hours 12 minutes 54 seconds  Total Procedure Duration: 0 hours 20 minutes 46 seconds  Findings:                 The perianal and digital rectal examinations were                            normal.                           A 6 mm polyp was found in the cecum. The polyp was                            sessile. The polyp was removed with a cold snare.                            Resection and retrieval were complete. Verification                            of patient identification for the specimen was  done. Estimated blood loss was minimal.                           Multiple diverticula were found in the sigmoid                            colon.                           The exam was otherwise without abnormality on                            direct and retroflexion views. Complications:            No immediate complications. Estimated Blood Loss:     Estimated blood loss was minimal. Impression:               - One 6 mm polyp in the cecum, removed with a cold                            snare. Resected and retrieved.                           - Diverticulosis in the sigmoid colon.                           - The examination was otherwise normal on direct                            and retroflexion views. Recommendation:           - Patient has a contact number available for                            emergencies. The signs and  symptoms of potential                            delayed complications were discussed with the                            patient. Return to normal activities tomorrow.                            Written discharge instructions were provided to the                            patient.                           - Resume previous diet.                           - Continue present medications.                           - Repeat colonoscopy is recommended. The  colonoscopy date will be determined after pathology                            results from today's exam become available for                            review. Gatha Mayer, MD 03/10/2018 2:09:55 PM This report has been signed electronically.

## 2018-03-11 ENCOUNTER — Telehealth: Payer: Self-pay

## 2018-03-11 NOTE — Telephone Encounter (Signed)
  Follow up Call-  Call back number 03/10/2018  Post procedure Call Back phone  # [phone]  Permission to leave phone message Yes  Some recent data might be hidden     Patient questions:  Do you have a fever, pain , or abdominal swelling? No. Pain Score  0 *  Have you tolerated food without any problems? Yes.    Have you been able to return to your normal activities? Yes.    Do you have any questions about your discharge instructions: Diet   No. Medications  No. Follow up visit  No.  Do you have questions or concerns about your Care? No.  Actions: * If pain score is 4 or above: No action needed, pain <4.

## 2018-03-17 ENCOUNTER — Encounter: Payer: Self-pay | Admitting: Internal Medicine

## 2018-03-17 DIAGNOSIS — Z860101 Personal history of adenomatous and serrated colon polyps: Secondary | ICD-10-CM

## 2018-03-17 DIAGNOSIS — Z86010 Personal history of colonic polyps: Secondary | ICD-10-CM

## 2018-03-17 HISTORY — DX: Personal history of adenomatous and serrated colon polyps: Z86.0101

## 2018-03-17 HISTORY — DX: Personal history of colonic polyps: Z86.010

## 2018-03-17 NOTE — Progress Notes (Signed)
6 mm adenoma cecum Recall 2024 My Chart letter

## 2018-04-07 ENCOUNTER — Other Ambulatory Visit: Payer: Self-pay | Admitting: Internal Medicine

## 2018-04-07 DIAGNOSIS — E785 Hyperlipidemia, unspecified: Secondary | ICD-10-CM

## 2018-05-01 ENCOUNTER — Other Ambulatory Visit: Payer: Self-pay | Admitting: Internal Medicine

## 2018-05-07 ENCOUNTER — Encounter: Payer: Self-pay | Admitting: Internal Medicine

## 2018-05-07 ENCOUNTER — Ambulatory Visit: Payer: 59 | Admitting: Internal Medicine

## 2018-05-07 VITALS — BP 110/62 | HR 88 | Temp 97.9°F | Wt 141.4 lb

## 2018-05-07 DIAGNOSIS — E119 Type 2 diabetes mellitus without complications: Secondary | ICD-10-CM | POA: Insufficient documentation

## 2018-05-07 DIAGNOSIS — Z5181 Encounter for therapeutic drug level monitoring: Secondary | ICD-10-CM | POA: Diagnosis not present

## 2018-05-07 DIAGNOSIS — Z0000 Encounter for general adult medical examination without abnormal findings: Secondary | ICD-10-CM

## 2018-05-07 DIAGNOSIS — Z79899 Other long term (current) drug therapy: Secondary | ICD-10-CM

## 2018-05-07 DIAGNOSIS — Z8249 Family history of ischemic heart disease and other diseases of the circulatory system: Secondary | ICD-10-CM

## 2018-05-07 DIAGNOSIS — I10 Essential (primary) hypertension: Secondary | ICD-10-CM | POA: Diagnosis not present

## 2018-05-07 DIAGNOSIS — Z2821 Immunization not carried out because of patient refusal: Secondary | ICD-10-CM

## 2018-05-07 DIAGNOSIS — E785 Hyperlipidemia, unspecified: Secondary | ICD-10-CM

## 2018-05-07 LAB — POCT GLYCOSYLATED HEMOGLOBIN (HGB A1C): Hemoglobin A1C: 6.4 % — AB (ref 4.0–5.6)

## 2018-05-07 NOTE — Patient Instructions (Addendum)
Better a1c    Continue lifestyle intervention healthy eating and exercise .  Stay on same med   To continue    Plan 6 mos pv or rov with full set of lab.

## 2018-05-07 NOTE — Progress Notes (Signed)
Chief Complaint  Patient presents with  . Follow-up    labs today. No new concerns    HPI: Valerie TROCHEZ 62 y.o. come in for Chronic disease management    Dm taking Jardiance and tolerating well no she could be eating better but feeling fine no vision changes neuro changes cardiovascular pulmonary symptoms. Ht controlled on lower dose of Benicar. CAD hld on low dose crestor  Anxiety rare use of medicine Pelvic organ prolapse eval and surgery  Jan  Walking activity no cardiovascular symptoms.   qo year  Eye check ocass   Her brother died in the past year from sudden cardiac death in his 3s.  Had underlying chronic pain no diagnosed heart history.  ROS: See pertinent positives and negatives per HPI.  Past Medical History:  Diagnosis Date  . Allergic rhinitis   . Allergy    seasonal  . Anxiety   . Arthritis    lower right back   . Cancer ([place])    skin cancer-scalp  . Diabetes mellitus without complication ([place])   . History of skin cancer   . Hx of adenomatous polyp of colon 03/17/2018  . Hypertension   . Osteopenia     Family History  Problem Relation Age of Onset  . Diabetes Other   . Hypertension Other   . Lung cancer Other   . Stroke Other        1st degree female <50  . Drug abuse Daughter   . Nephrolithiasis Daughter   . Colon polyps Mother   . Liver disease Sister   . Pancreatic cancer Sister   . Colon cancer Neg Hx   . Rectal cancer Neg Hx   . Stomach cancer Neg Hx   . Esophageal cancer Neg Hx     Social History   Socioeconomic History  . Marital status: Divorced    Spouse name: Not on file  . Number of children: Not on file  . Years of education: Not on file  . Highest education level: Not on file  Occupational History  . Not on file  Social Needs  . Financial resource strain: Not on file  . Food insecurity:    Worry: Not on file    Inability: Not on file  . Transportation needs:    Medical: Not on file    Non-medical: Not on file    Tobacco Use  . Smoking status: Former Research scientist (life sciences)  . Smokeless tobacco: Never Used  . Tobacco comment: Quit 35 years ago  Substance and Sexual Activity  . Alcohol use: No  . Drug use: No  . Sexual activity: Not on file  Lifestyle  . Physical activity:    Days per week: Not on file    Minutes per session: Not on file  . Stress: Not on file  Relationships  . Social connections:    Talks on phone: Not on file    Gets together: Not on file    Attends religious service: Not on file    Active member of club or organization: Not on file    Attends meetings of clubs or organizations: Not on file    Relationship status: Not on file  Other Topics Concern  . 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 fexofenadine (ALLEGRA) 180 MG tablet Take 180 mg by mouth daily as needed for allergies or rhinitis.     . fluticasone (FLONASE) 50 MCG/ACT nasal spray SPRAY 2 SPRAYS INTO EACH NOSTRIL EVERY DAY 16 g 3  . JARDIANCE 10 MG TABS tablet TAKE 1 TABLET BY MOUTH BY MOUTH DAILY. 30 tablet 3  . LORazepam (ATIVAN) 0.5 MG tablet Take 1 tablet (0.5 mg total) by mouth 2 (two) times daily as needed for anxiety. Can take 2 if needed 30 tablet 0  . naproxen (NAPROSYN) 500 MG tablet TAKE 1 TABLET BY MOUTH TWICE A DAY DAILY FOR BACK PAIN 60 tablet 0  . olmesartan (BENICAR) 20 MG tablet Take 1 tablet (20 mg total) by mouth daily. May decrease to 10 mg per day if controlled 30 tablet 1  . rosuvastatin (CRESTOR) 5 MG tablet TAKE 1 TABLET BY MOUTH EVERY DAY 30 tablet 2  . polyethylene glycol powder (GLYCOLAX/MIRALAX) powder Take 1 Container by mouth once.     Facility-Administered Medications Prior to Visit  Medication Dose Route Frequency Provider Last Rate Last Dose  . 0.9 %   sodium chloride infusion  500 mL Intravenous Once Gatha Mayer, MD         EXAM:  BP 110/62 (BP Location: Right Arm, Patient Position: Sitting, Cuff Size: Normal)   Pulse 88   Temp 97.9 F (36.6 C) (Oral)   Wt 141 lb 6.4 oz (64.1 kg)   LMP  (LMP Unknown)   BMI 23.53 kg/m   Body mass index is 23.53 kg/m.  GENERAL: vitals reviewed and listed above, alert, oriented, appears well hydrated and in no acute distress HEENT: atraumatic, conjunctiva  clear, no obvious abnormalities on inspection of external nose and ears OP : no lesion edema or exudate  NECK: no obvious masses on inspection palpation  LUNGS: clear to auscultation bilaterally, no wheezes, rales or rhonchi, good air movement CV: HRRR, no clubbing cyanosis or  peripheral edema nl cap refill  MS: moves all extremities without noticeable focal  Abnormality  Feet normal  NV intact  Diabetic Foot Exam - Simple   Simple Foot Form Diabetic Foot exam was performed with the following findings:  Yes 05/07/2018 10:11 AM  Visual Inspection No deformities, no ulcerations, no other skin breakdown bilaterally:  Yes Sensation Testing See comments:  Yes Pulse Check Posterior Tibialis and Dorsalis pulse intact bilaterally:  Yes Comments Sensation intact     PSYCH: pleasant and cooperative, no obvious depression or anxiety Lab Results  Component Value Date   WBC 5.6 12/28/2017   HGB 13.0 12/28/2017   HCT 38.4 12/28/2017   PLT 215.0 12/28/2017   GLUCOSE 156 (H) 12/28/2017   CHOL 153 09/04/2017   TRIG 91 09/04/2017   HDL 54 09/04/2017   LDLDIRECT 152.9 06/26/2009   LDLCALC 81 09/04/2017   ALT 19 09/04/2017   AST 15 09/04/2017   NA 144 12/28/2017   K 3.9 12/28/2017   CL 107 12/28/2017   CREATININE 0.98 12/28/2017   BUN 18 12/28/2017   CO2 29 12/28/2017   TSH 1.16 12/28/2017   HGBA1C 6.4 (A) 05/07/2018   MICROALBUR 0.8 12/28/2017   BP Readings from Last 3 Encounters:  05/07/18 110/62  03/10/18 102/70  12/28/17 102/68      ASSESSMENT AND PLAN:  Discussed the following assessment and plan:  Type 2 diabetes mellitus without complication,  without long-term current use of insulin (HCC) - controlled and much better on jardiance  continue  - Plan: POC HgB M1D, Basic metabolic panel, CBC with Differential/Platelet, Hemoglobin A1c, Hepatic function panel, Lipid panel, TSH, Microalbumin / creatinine urine ratio  Medication management - Plan: POC HgB Q2I, Basic metabolic panel, CBC with Differential/Platelet, Hemoglobin A1c, Hepatic function panel, Lipid panel, TSH, Microalbumin / creatinine urine ratio  Essential hypertension - Plan: Basic metabolic panel, CBC with Differential/Platelet, Hemoglobin A1c, Hepatic function panel, Lipid panel, TSH, Microalbumin / creatinine urine ratio  Medication monitoring encounter - Plan: POC HgB W9N, Basic metabolic panel, CBC with Differential/Platelet, Hemoglobin A1c, Hepatic function panel, Lipid panel, TSH, Microalbumin / creatinine urine ratio  Hyperlipidemia, unspecified hyperlipidemia type - Plan: Basic metabolic panel, CBC with Differential/Platelet, Hemoglobin A1c, Hepatic function panel, Lipid panel, TSH  future labs for preventive health examination - Plan: Basic metabolic panel, CBC with Differential/Platelet, Hemoglobin A1c, Hepatic function panel, Lipid panel, TSH  Family history of premature CAD - broth dided sudden death in late 69s   Influenza vaccination declined by patient - counseld benefit more than risk    Plan  Yearly visit and labs  PV    in January feb  or as needed  m Continue lifestyle intervention healthy eating and exercise .   Dm now controlled   And optimization of risk factors  -Patient advised to return or notify health care team  if  new concerns arise.  Patient Instructions  Better a1c    Continue lifestyle intervention healthy eating and exercise .  Stay on same med   To continue    Plan 6 mos pv or rov with full set of lab.     Standley Brooking. Lakina Mcintire M.D.

## 2018-05-22 ENCOUNTER — Other Ambulatory Visit: Payer: Self-pay | Admitting: Internal Medicine

## 2018-07-02 ENCOUNTER — Other Ambulatory Visit: Payer: Self-pay | Admitting: Internal Medicine

## 2018-07-02 DIAGNOSIS — E785 Hyperlipidemia, unspecified: Secondary | ICD-10-CM

## 2018-07-14 ENCOUNTER — Other Ambulatory Visit: Payer: Self-pay | Admitting: Internal Medicine

## 2018-07-14 NOTE — Telephone Encounter (Unsigned)
Copied from [place] [id number]. Topic: Quick Communication - See Telephone Encounter >> Jul 14, 2018  2:54 PM Hewitt Shorts wrote: Pt is aware that it has been since 2013 that she has gotten the allegra rx filled but she usually uses just the flonase and it helps but with the changing in the weather she is needing extra help. And she states that her last visit with Dr. Regis Bill on 05/2018   Pharmacy is [facility]  Best number [phone]

## 2018-08-12 ENCOUNTER — Telehealth: Payer: Self-pay

## 2018-08-12 NOTE — Telephone Encounter (Signed)
Pt states she recently had a yeast infection and is concerned that this was a side affect of the Jardiance. She states that she never used to get yeast infections. Pt states that her sx's are gone. Pt used [facility] 1 day which did not work so she used the 3 day. Pt wanted Dr Regis Bill to just be aware in case she gets another infection.  Pt advised that if sx's return to call our office.   Nothing further needed.

## 2018-08-12 NOTE — Telephone Encounter (Signed)
Copied from [place] [id number]. Topic: General - Other >> Aug 12, 2018  8:35 AM Valerie Gates wrote: Reason for CRM: Patient called stating she is currently on [place] 10 MG and had a yeast infection in November. Patient just wanted to leave FYI.

## 2018-08-20 NOTE — Telephone Encounter (Signed)
Noted   Yes medication can increase risk of yeast infection. Will follow

## 2018-08-29 ENCOUNTER — Other Ambulatory Visit: Payer: Self-pay | Admitting: Internal Medicine

## 2018-09-08 DIAGNOSIS — L259 Unspecified contact dermatitis, unspecified cause: Secondary | ICD-10-CM | POA: Diagnosis not present

## 2018-09-08 DIAGNOSIS — N8110 Cystocele, unspecified: Secondary | ICD-10-CM | POA: Diagnosis not present

## 2018-09-08 DIAGNOSIS — N8111 Cystocele, midline: Secondary | ICD-10-CM | POA: Diagnosis not present

## 2018-09-08 DIAGNOSIS — R82998 Other abnormal findings in urine: Secondary | ICD-10-CM | POA: Diagnosis not present

## 2018-09-08 DIAGNOSIS — Z09 Encounter for follow-up examination after completed treatment for conditions other than malignant neoplasm: Secondary | ICD-10-CM | POA: Diagnosis not present

## 2018-09-13 ENCOUNTER — Ambulatory Visit: Payer: 59 | Admitting: Cardiology

## 2018-09-13 ENCOUNTER — Encounter: Payer: Self-pay | Admitting: Cardiology

## 2018-09-13 VITALS — BP 106/70 | HR 73 | Ht 65.0 in | Wt 144.4 lb

## 2018-09-13 DIAGNOSIS — I10 Essential (primary) hypertension: Secondary | ICD-10-CM | POA: Diagnosis not present

## 2018-09-13 DIAGNOSIS — E782 Mixed hyperlipidemia: Secondary | ICD-10-CM | POA: Diagnosis not present

## 2018-09-13 DIAGNOSIS — I700 Atherosclerosis of aorta: Secondary | ICD-10-CM | POA: Diagnosis not present

## 2018-09-13 NOTE — Addendum Note (Signed)
Addended by: Nuala Alpha on: 09/13/2018 04:18 PM   Modules accepted: Orders

## 2018-09-13 NOTE — Patient Instructions (Addendum)
Medication Instructions:    Your physician recommends that you continue on your current medications as directed. Please refer to the Current Medication list given to you today.  If you need a refill on your cardiac medications before your next appointment, please call your pharmacy.     Lab work:  YOU WILL COME IN TOMORROW FOR LAB-WE WILL CHECK A CMET, CBC W DIFF, TSH, AND LIPIDS--PLEASE COME FASTING TO THIS LAB APPOINTMENT-- If you have labs (blood work) drawn today and your tests are completely normal, you will receive your results only by: Marland Kitchen MyChart Message (if you have MyChart) OR . A paper copy in the mail If you have any lab test that is abnormal or we need to change your treatment, we will call you to review the results.    Follow-Up: At [facility], you and your health needs are our priority.  As part of our continuing mission to provide you with exceptional heart care, we have created designated Provider Care Teams.  These Care Teams include your primary Cardiologist (physician) and Advanced Practice Providers (APPs -  Physician Assistants and Nurse Practitioners) who all work together to provide you with the care you need, when you need it. You will need a follow up appointment in 12 months.  Please call our office 2 months in advance to schedule this appointment.  You may see Ena Dawley, MD or one of the following Advanced Practice Providers on your designated Care Team:   [place], PA-C Melina Copa, PA-C . Ermalinda Barrios, PA-C

## 2018-09-13 NOTE — Progress Notes (Signed)
Cardiology Office Note    Date:  09/13/2018   ID:  Valerie Gates, DOB 04-Feb-1956, MRN [id number]  PCP:  Burnis Medin, MD  Cardiologist:   Ena Dawley, MD   Chief complain: Calcification on abdominal CT.  History of Present Illness:  Valerie Gates is a 63 y.o. female prior medical history of type 2 diabetes diagnosed a year ago, untreated hyperlipidemia and hypertension who had an abdominal CT for diagnosis of kidney stone and had an incidental finding of calcification in her abdominal aorta. I have personally reviewed her abdominal CT and there is mild_plaque in her abdominal aorta. No calcium was seen in the coronary arteries, however the most proximal portions are not visualized. The patient remains active she exercises you back once a week and walks almost every day, she has lost 20 pounds since last year when she was diagnosed with diabetes. She denies any chest pain or shortness of breath she has no palpitations, claudications no lower extremity edema dizziness or syncope. In her family her mother was diagnosed with atrial fibrillation, her grandfather had myocardial infarction in his 61s, he was a smoker.  09/04/2017 - the patient is coming after one year, she states that she feels absolutely perfect, she denies any chest pain, shortness of breath, exertional dizziness, falls no claudication palpitation or syncope. She has been compliant with her medications and she is able to tolerate rosuvastatin. She Response to that with on decreasing LDL. She walks daily with no side effects. She had blood work done in April 2018 with HbA1c 6.8%, after changing her diet eating more greens and remaining dietitian it decreased to 6.4%.  09/13/2018 -this is 1 year follow-up, the patient feels and looks great, she walks 3-4 times a week and exercises yoga once a week, she has no chest pain shortness of breath no orthopnea proximal nocturnal dyspnea no lower extremity edema or claudications.  She gets  occasional muscle cramping but otherwise is tolerating Crestor well.  She has been started on Jardiance for hemoglobin A1c of 7.1.  Past Medical History:  Diagnosis Date  . Allergic rhinitis   . Allergy    seasonal  . Anxiety   . Arthritis    lower right back   . Cancer ([place])    skin cancer-scalp  . Diabetes mellitus without complication ([place])   . History of skin cancer   . Hx of adenomatous polyp of colon 03/17/2018  . Hypertension   . Osteopenia     Past Surgical History:  Procedure Laterality Date  . BLADDER SURGERY    . COLONOSCOPY    . skin cancer removed on head      Current Medications: Outpatient Medications Prior to Visit  Medication Sig Dispense Refill  . fexofenadine (ALLEGRA) 180 MG tablet Take 180 mg by mouth daily as needed for allergies or rhinitis.     . fluticasone (FLONASE) 50 MCG/ACT nasal spray Place into both nostrils as needed for allergies or rhinitis.    Marland Kitchen JARDIANCE 10 MG TABS tablet TAKE 1 TABLET BY MOUTH BY MOUTH DAILY. 30 tablet 3  . LORazepam (ATIVAN) 0.5 MG tablet Take 1 tablet (0.5 mg total) by mouth 2 (two) times daily as needed for anxiety. Can take 2 if needed 30 tablet 0  . naproxen (NAPROSYN) 500 MG tablet Take 500 mg by mouth as needed.    Marland Kitchen olmesartan (BENICAR) 20 MG tablet Take 10 mg by mouth daily.    . rosuvastatin (CRESTOR) 5 MG  tablet TAKE 1 TABLET BY MOUTH EVERY DAY 30 tablet 3  . triamcinolone ointment (KENALOG) 0.5 % Apply to the labia    . fluticasone (FLONASE) 50 MCG/ACT nasal spray SPRAY 2 SPRAYS INTO EACH NOSTRIL EVERY DAY (Patient not taking: Reported on 09/13/2018) 16 g 3  . naproxen (NAPROSYN) 500 MG tablet TAKE 1 TABLET BY MOUTH TWICE A DAY DAILY FOR BACK PAIN (Patient not taking: Reported on 09/13/2018) 60 tablet 0  . olmesartan (BENICAR) 20 MG tablet TAKE 1 TABLET BY MOUTH EVERY DAY MAY DECREASE TO 10MG  TABS PER DAY IF CONTROLLED (Patient not taking: Reported on 09/13/2018) 30 tablet 5   Facility-Administered Medications  Prior to Visit  Medication Dose Route Frequency Provider Last Rate Last Dose  . 0.9 %  sodium chloride infusion  500 mL Intravenous Once Gatha Mayer, MD         Allergies:   Metformin and Metformin and related   Social History   Socioeconomic History  . Marital status: Divorced    Spouse name: Not on file  . Number of children: Not on file  . Years of education: Not on file  . Highest education level: Not on file  Occupational History  . Not on file  Social Needs  . Financial resource strain: Not on file  . Food insecurity:    Worry: Not on file    Inability: Not on file  . Transportation needs:    Medical: Not on file    Non-medical: Not on file  Tobacco Use  . Smoking status: Former Research scientist (life sciences)  . Smokeless tobacco: Never Used  . Tobacco comment: Quit 35 years ago  Substance and Sexual Activity  . Alcohol use: No  . Drug use: No  . Sexual activity: Not on file  Lifestyle  . Physical activity:    Days per week: Not on file    Minutes per session: Not on file  . Stress: Not on file  Relationships  . Social connections:    Talks on phone: Not on file    Gets together: Not on file    Attends religious service: Not on file    Active member of club or organization: Not on file    Attends meetings of clubs or organizations: Not on file    Relationship status: Not on file  Other Topics Concern  . 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   ROS:   Please  see the history of present illness.    ROS All other systems reviewed and are negative.  PHYSICAL EXAM:   VS:  BP 106/70   Pulse 73   Ht 5\' 5"  (1.651 m)   Wt 144 lb 6.4 oz (65.5 kg)   LMP  (LMP Unknown)   SpO2 97%   BMI 24.03 kg/m    GEN: Well nourished, well developed, in no acute distress  HEENT: normal  Neck: no JVD, carotid bruits, or masses Cardiac: RRR; no murmurs, rubs, or gallops,no edema  Respiratory:  clear to auscultation bilaterally, normal work of breathing GI: soft, nontender, nondistended, + BS MS: no deformity or atrophy  Skin: warm and dry, no rash Neuro:  Alert and Oriented x 3, Strength and sensation are intact Psych: euthymic mood, full affect  Wt Readings from Last 3 Encounters:  09/13/18 144 lb 6.4 oz (65.5 kg)  05/07/18 141 lb 6.4 oz (64.1 kg)  03/10/18 140 lb (63.5 kg)    Studies/Labs Reviewed:   EKG:  EKG is ordered today and it shows normal sinus rhythm normal EKG, unchanged from prior. This was personally reviewed.  Recent Labs: 12/28/2017: BUN 18; Creatinine, Ser 0.98; Hemoglobin 13.0; Platelets 215.0; Potassium 3.9; Sodium 144; TSH 1.16   Lipid Panel    Component Value Date/Time   CHOL 153 09/04/2017 1017   TRIG 91 09/04/2017 1017   HDL 54 09/04/2017 1017   CHOLHDL 2.8 09/04/2017 1017   CHOLHDL 2.7 08/18/2016 1206   VLDL 20 08/18/2016 1206   LDLCALC 81 09/04/2017 1017   LDLDIRECT 152.9 06/26/2009 1018    Additional studies/ records that were reviewed today include:    ASSESSMENT:    1. Essential hypertension   2. Mixed hyperlipidemia   3. Aortic atherosclerosis (HCC)      PLAN:  In order of problems listed above:  1. We will continue preventive therapy for coronary artery disease with low-dose of Crestor, most recent lipids a year ago all at goal, will repeat now 2. Hyperlipidemia  - as above 3. Hypertension - well controlled.  Medication Adjustments/Labs and Tests Ordered: Current medicines are reviewed at length with the  patient today.  Concerns regarding medicines are outlined above.  Medication changes, Labs and Tests ordered today are listed in the Patient Instructions below. There are no Patient Instructions on file for this visit.   Signed, Ena Dawley, MD  09/13/2018 4:05 PM    [facility] [place], [place], [place]  [address] Phone: [phone]; Fax: [phone]

## 2018-09-14 ENCOUNTER — Other Ambulatory Visit: Payer: 59

## 2018-09-14 DIAGNOSIS — I700 Atherosclerosis of aorta: Secondary | ICD-10-CM | POA: Diagnosis not present

## 2018-09-14 DIAGNOSIS — E782 Mixed hyperlipidemia: Secondary | ICD-10-CM | POA: Diagnosis not present

## 2018-09-14 DIAGNOSIS — I10 Essential (primary) hypertension: Secondary | ICD-10-CM | POA: Diagnosis not present

## 2018-09-15 LAB — CBC WITH DIFFERENTIAL/PLATELET
Basophils Absolute: 0 10*3/uL (ref 0.0–0.2)
Basos: 1 %
EOS (ABSOLUTE): 0 10*3/uL (ref 0.0–0.4)
Eos: 1 %
Hematocrit: 40.3 % (ref 34.0–46.6)
Hemoglobin: 13.3 g/dL (ref 11.1–15.9)
Immature Grans (Abs): 0 10*3/uL (ref 0.0–0.1)
Immature Granulocytes: 0 %
Lymphocytes Absolute: 1.3 10*3/uL (ref 0.7–3.1)
Lymphs: 32 %
MCH: 31.4 pg (ref 26.6–33.0)
MCHC: 33 g/dL (ref 31.5–35.7)
MCV: 95 fL (ref 79–97)
Monocytes Absolute: 0.3 10*3/uL (ref 0.1–0.9)
Monocytes: 8 %
Neutrophils Absolute: 2.5 10*3/uL (ref 1.4–7.0)
Neutrophils: 58 %
Platelets: 222 10*3/uL (ref 150–450)
RBC: 4.24 x10E6/uL (ref 3.77–5.28)
RDW: 11.8 % (ref 11.7–15.4)
WBC: 4.2 10*3/uL (ref 3.4–10.8)

## 2018-09-15 LAB — COMPREHENSIVE METABOLIC PANEL
ALT: 15 IU/L (ref 0–32)
AST: 13 IU/L (ref 0–40)
Albumin/Globulin Ratio: 2 (ref 1.2–2.2)
Albumin: 4.3 g/dL (ref 3.6–4.8)
Alkaline Phosphatase: 74 IU/L (ref 39–117)
BUN/Creatinine Ratio: 22 (ref 12–28)
BUN: 19 mg/dL (ref 8–27)
Bilirubin Total: 0.5 mg/dL (ref 0.0–1.2)
CO2: 23 mmol/L (ref 20–29)
Calcium: 9.2 mg/dL (ref 8.7–10.3)
Chloride: 106 mmol/L (ref 96–106)
Creatinine, Ser: 0.86 mg/dL (ref 0.57–1.00)
GFR calc Af Amer: 84 mL/min/{1.73_m2} (ref >59)
GFR calc non Af Amer: 73 mL/min/{1.73_m2} (ref >59)
Globulin, Total: 2.1 g/dL (ref 1.5–4.5)
Glucose: 123 mg/dL — ABNORMAL HIGH (ref 65–99)
Potassium: 4.2 mmol/L (ref 3.5–5.2)
Sodium: 143 mmol/L (ref 134–144)
Total Protein: 6.4 g/dL (ref 6.0–8.5)

## 2018-09-15 LAB — LIPID PANEL
Chol/HDL Ratio: 4.4 ratio (ref 0.0–4.4)
Cholesterol, Total: 239 mg/dL — ABNORMAL HIGH (ref 100–199)
HDL: 54 mg/dL (ref >39)
LDL Calculated: 157 mg/dL — ABNORMAL HIGH (ref 0–99)
Triglycerides: 139 mg/dL (ref 0–149)
VLDL Cholesterol Cal: 28 mg/dL (ref 5–40)

## 2018-09-15 LAB — TSH: TSH: 1.61 u[IU]/mL (ref 0.450–4.500)

## 2018-09-16 ENCOUNTER — Other Ambulatory Visit: Payer: Self-pay | Admitting: Internal Medicine

## 2018-09-17 NOTE — Telephone Encounter (Signed)
Please advise  Last filled:12/28/17 Last OV:05/07/18

## 2018-09-17 NOTE — Telephone Encounter (Signed)
Sent in electronically .  

## 2018-11-01 ENCOUNTER — Other Ambulatory Visit (INDEPENDENT_AMBULATORY_CARE_PROVIDER_SITE_OTHER): Payer: 59

## 2018-11-01 DIAGNOSIS — I10 Essential (primary) hypertension: Secondary | ICD-10-CM

## 2018-11-01 DIAGNOSIS — E119 Type 2 diabetes mellitus without complications: Secondary | ICD-10-CM

## 2018-11-01 DIAGNOSIS — Z0000 Encounter for general adult medical examination without abnormal findings: Secondary | ICD-10-CM

## 2018-11-01 DIAGNOSIS — Z5181 Encounter for therapeutic drug level monitoring: Secondary | ICD-10-CM

## 2018-11-01 DIAGNOSIS — E785 Hyperlipidemia, unspecified: Secondary | ICD-10-CM

## 2018-11-01 DIAGNOSIS — Z79899 Other long term (current) drug therapy: Secondary | ICD-10-CM | POA: Diagnosis not present

## 2018-11-01 LAB — CBC WITH DIFFERENTIAL/PLATELET
Basophils Absolute: 0 10*3/uL (ref 0.0–0.1)
Basophils Relative: 0.7 % (ref 0.0–3.0)
EOS PCT: 0.6 % (ref 0.0–5.0)
Eosinophils Absolute: 0 10*3/uL (ref 0.0–0.7)
HCT: 40.9 % (ref 36.0–46.0)
Hemoglobin: 13.9 g/dL (ref 12.0–15.0)
Lymphocytes Relative: 25.6 % (ref 12.0–46.0)
Lymphs Abs: 1.4 10*3/uL (ref 0.7–4.0)
MCHC: 33.9 g/dL (ref 30.0–36.0)
MCV: 95 fl (ref 78.0–100.0)
Monocytes Absolute: 0.4 10*3/uL (ref 0.1–1.0)
Monocytes Relative: 7.1 % (ref 3.0–12.0)
Neutro Abs: 3.5 10*3/uL (ref 1.4–7.7)
Neutrophils Relative %: 66 % (ref 43.0–77.0)
Platelets: 205 10*3/uL (ref 150.0–400.0)
RBC: 4.3 Mil/uL (ref 3.87–5.11)
RDW: 12.9 % (ref 11.5–15.5)
WBC: 5.3 10*3/uL (ref 4.0–10.5)

## 2018-11-01 LAB — TSH: TSH: 1.29 u[IU]/mL (ref 0.35–4.50)

## 2018-11-01 LAB — BASIC METABOLIC PANEL
BUN: 25 mg/dL — ABNORMAL HIGH (ref 6–23)
CALCIUM: 9.2 mg/dL (ref 8.4–10.5)
CO2: 28 mEq/L (ref 19–32)
Chloride: 110 mEq/L (ref 96–112)
Creatinine, Ser: 0.79 mg/dL (ref 0.40–1.20)
GFR: 73.8 mL/min (ref >60.00)
Glucose, Bld: 111 mg/dL — ABNORMAL HIGH (ref 70–99)
Potassium: 3.9 mEq/L (ref 3.5–5.1)
Sodium: 145 mEq/L (ref 135–145)

## 2018-11-01 LAB — HEPATIC FUNCTION PANEL
ALT: 14 U/L (ref 0–35)
AST: 13 U/L (ref 0–37)
Albumin: 4.3 g/dL (ref 3.5–5.2)
Alkaline Phosphatase: 70 U/L (ref 39–117)
Bilirubin, Direct: 0.1 mg/dL (ref 0.0–0.3)
Total Bilirubin: 0.5 mg/dL (ref 0.2–1.2)
Total Protein: 6.4 g/dL (ref 6.0–8.3)

## 2018-11-01 LAB — LIPID PANEL
CHOLESTEROL: 170 mg/dL (ref 0–200)
HDL: 54.4 mg/dL (ref >39.00)
LDL Cholesterol: 101 mg/dL — ABNORMAL HIGH (ref 0–99)
NonHDL: 115.88
TRIGLYCERIDES: 75 mg/dL (ref 0.0–149.0)
Total CHOL/HDL Ratio: 3
VLDL: 15 mg/dL (ref 0.0–40.0)

## 2018-11-01 LAB — MICROALBUMIN / CREATININE URINE RATIO
Creatinine,U: 70.6 mg/dL
Microalb Creat Ratio: 1 mg/g (ref 0.0–30.0)
Microalb, Ur: <0.7 mg/dL (ref 0.0–1.9)

## 2018-11-01 LAB — HEMOGLOBIN A1C: HEMOGLOBIN A1C: 7 % — AB (ref 4.6–6.5)

## 2018-11-05 NOTE — Progress Notes (Signed)
Chief Complaint  Patient presents with  . Annual Exam    pt is concern about contact dermatitis in genital area pt is using kenalog and is not sure about how long to use it for     HPI: Patient  Valerie Gates  63 y.o. comes in today for Preventive Health Care visit   Lorazepam not that much use   2-3 months  Dm feels not eating as well 1/2 Sister  Metastatic pancreatic cancer  Stress but coping.  Had labial irritation poss CD rx with steoid not toallyu well  Not felt to be a years infection   Health Maintenance  Topic Date Due  . Hepatitis C Screening  1956-01-23  . PNEUMOCOCCAL POLYSACCHARIDE VACCINE AGE 77-64 HIGH RISK  01/20/1959  . HIV Screening  01/20/1972  . MAMMOGRAM  04/30/2015  . OPHTHALMOLOGY EXAM  11/08/2018 (Originally 04/23/2017)  . INFLUENZA VACCINE  12/01/2018 (Originally 04/01/2018)  . HEMOGLOBIN A1C  05/04/2019  . FOOT EXAM  05/08/2019  . TETANUS/TDAP  10/14/2021  . COLONOSCOPY  03/11/2023  . PAP SMEAR-Modifier  Discontinued   Health Maintenance Review LIFESTYLE:  Exercise:   Getting back on track.  Tobacco/ETS: no Alcohol:   rare Sugar beverages: no  Sleep:5+ hours   Stress   Drug use: no HH of  1+  2 dogs  Work:y  ROS:  GEN/ HEENT: No fever, significant weight changes sweats headaches vision problems hearing changes, CV/ PULM; No chest pain shortness of breath cough, syncope,edema  change in exercise tolerance. GI /GU: No adominal pain, vomiting, change in bowel habits. No blood in the stool. No significant GU symptoms. SKIN/HEME: ,no acute skin rashes suspicious lesions or bleeding. No lymphadenopathy, nodules, masses.  NEURO/ PSYCH:  No neurologic signs such as weakness numbness. No depression anxiety. IMM/ Allergy: No unusual infections.  Allergy .   REST of 12 system review negative except as per HPI   Past Medical History:  Diagnosis Date  . Allergic rhinitis   . Allergy    seasonal  . Anxiety   . Arthritis    lower right back   . Cancer  ([place])    skin cancer-scalp  . Diabetes mellitus without complication ([place])   . History of skin cancer   . Hx of adenomatous polyp of colon 03/17/2018  . Hypertension   . Osteopenia     Past Surgical History:  Procedure Laterality Date  . BLADDER SURGERY    . COLONOSCOPY    . skin cancer removed on head      Family History  Problem Relation Age of Onset  . Diabetes Other   . Hypertension Other   . Lung cancer Other   . Stroke Other        1st degree female <50  . Drug abuse Daughter   . Nephrolithiasis Daughter   . Colon polyps Mother   . Liver disease Sister   . Pancreatic cancer Sister   . Colon cancer Neg Hx   . Rectal cancer Neg Hx   . Stomach cancer Neg Hx   . Esophageal cancer Neg Hx     Social History   Socioeconomic History  . Marital status: Divorced    Spouse name: Not on file  . Number of children: Not on file  . Years of education: Not on file  . Highest education level: Not on file  Occupational History  . Not on file  Social Needs  . Financial resource strain: Not on file  .  Food insecurity:    Worry: Not on file    Inability: Not on file  . Transportation needs:    Medical: Not on file    Non-medical: Not on file  Tobacco Use  . Smoking status: Former Research scientist (life sciences)  . Smokeless tobacco: Never Used  . Tobacco comment: Quit 35 years ago  Substance and Sexual Activity  . Alcohol use: No  . Drug use: No  . Sexual activity: Not on file  Lifestyle  . Physical activity:    Days per week: Not on file    Minutes per session: Not on file  . Stress: Not on file  Relationships  . Social connections:    Talks on phone: Not on file    Gets together: Not on file    Attends religious service: Not on file    Active member of club or organization: Not on file    Attends meetings of clubs or organizations: Not on file    Relationship status: Not on file  Other Topics Concern  . 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 fexofenadine (ALLEGRA) 180 MG tablet Take 180 mg by mouth daily as needed for allergies or rhinitis.     . fluticasone (FLONASE) 50 MCG/ACT nasal spray Place into both nostrils as needed for allergies or rhinitis.    Marland Kitchen JARDIANCE 10 MG TABS tablet TAKE 1 TABLET BY MOUTH BY MOUTH DAILY. 30 tablet 3  . LORazepam (ATIVAN) 0.5 MG tablet TAKE 1 TABLET (0.5 MG TOTAL) BY MOUTH 2 TIMES DAILY AS NEEDED FOR ANXIETY. CAN TAKE 2 IF NEEDED 30 tablet 0  . naproxen (NAPROSYN) 500 MG tablet Take 500 mg by mouth as needed.    Marland Kitchen olmesartan (BENICAR) 20 MG tablet Take 10 mg by mouth daily.    . rosuvastatin (CRESTOR) 5 MG tablet TAKE 1 TABLET BY MOUTH EVERY DAY 30 tablet 3  . triamcinolone ointment (KENALOG) 0.5 % Apply to the labia    . valACYclovir (VALTREX) 1000 MG tablet TAKE 1/2 TABLET BY MOUTH TWICE A DAY FOR 3 DAYS AS NEEDED 12 tablet 3   Facility-Administered Medications Prior to Visit  Medication Dose Route Frequency Provider Last Rate Last Dose  . 0.9 %  sodium chloride infusion  500 mL Intravenous Once Gatha Mayer, MD         EXAM:  BP 118/68 (BP Location: Right Arm, Patient Position: Sitting, Cuff Size: Normal)   Pulse 77   Temp 98.8 F (37.1 C) (Oral)   Ht 5' 4"  (1.626 m)   Wt 143 lb 4.8 oz (65 kg)   LMP  (LMP Unknown)   BMI 24.60 kg/m   Body mass index is 24.6 kg/m. Wt Readings from Last 3 Encounters:  11/08/18 143 lb 4.8 oz (65 kg)  09/13/18 144 lb 6.4 oz (65.5 kg)  05/07/18 141 lb 6.4 oz (64.1 kg)    Physical Exam: Vital signs reviewed VEH:MCNO is a well-developed well-nourished alert cooperative    who appearsr stated age in no acute distress.  HEENT: normocephalic atraumatic , Eyes: PERRL EOM's full, conjunctiva clear, Nares: paten,t no deformity discharge or  tenderness., Ears: no deformity EAC's  clear TMs with normal landmarks. Mouth: clear OP, no lesions, edema.  Moist mucous membranes. Dentition in adequate repair. NECK: supple without masses, thyromegaly or bruits. CHEST/PULM:  Clear to auscultation and percussion breath sounds equal no wheeze , rales or rhonchi. No chest wall deformities or tenderness. Breast: normal by inspection . No dimpling, discharge, masses, tenderness or discharge . CV: PMI is nondisplaced, S1 S2 no gallops, murmurs, rubs. Peripheral pulses are full without delay.No JVD.  ABDOMEN: Bowel sounds normal nontender  No guard or rebound, no hepato splenomegal no CVA tenderness.  No hernia. Extremtities:  No clubbing cyanosis or edema, no acute joint swelling or redness no focal atrophy NEURO:  Oriented x3, cranial nerves 3-12 appear to be intact, no obvious focal weakness,gait within normal limits no abnormal reflexes or asymmetrical SKIN: No acute rashes normal turgor, color, no bruising or petechiae. PSYCH: Oriented, good eye contact, no obvious depression anxiety, cognition and judgment appear normal. LN: no cervical axillary inguinal adenopathy Diabetic Foot Exam - Simple   Simple Foot Form Diabetic Foot exam was performed with the following findings:  Yes 11/08/2018 10:48 AM  Visual Inspection No deformities, no ulcerations, no other skin breakdown bilaterally:  Yes Sensation Testing Intact to touch and monofilament testing bilaterally:  Yes Pulse Check Posterior Tibialis and Dorsalis pulse intact bilaterally:  Yes Comments     Lab Results  Component Value Date   WBC 5.3 11/01/2018   HGB 13.9 11/01/2018   HCT 40.9 11/01/2018   PLT 205.0 11/01/2018   GLUCOSE 111 (H) 11/01/2018   CHOL 170 11/01/2018   TRIG 75.0 11/01/2018   HDL 54.40 11/01/2018   LDLDIRECT 152.9 06/26/2009   LDLCALC 101 (H) 11/01/2018   ALT 14 11/01/2018   AST 13 11/01/2018   NA 145 11/01/2018   K 3.9 11/01/2018   CL 110 11/01/2018    CREATININE 0.79 11/01/2018   BUN 25 (H) 11/01/2018   CO2 28 11/01/2018   TSH 1.29 11/01/2018   HGBA1C 7.0 (H) 11/01/2018   MICROALBUR <0.7 11/01/2018    BP Readings from Last 3 Encounters:  11/08/18 118/68  09/13/18 106/70  05/07/18 110/62    Lab results reviewed with patient   ASSESSMENT AND PLAN:  Discussed the following assessment and plan:  Visit for preventive health examination  Type 2 diabetes mellitus without complication, without long-term current use of insulin (HCC)  Medication management  Hyperlipidemia, unspecified hyperlipidemia type  Stress due to illness of family member - coping well   Essential hypertension Check  On td vaccine  rov in 3-4 mos for a1c check and immun as indicated   Declines flu vaccine   " doesn't get the flu" Can see gyne for the labial issue   reconsider  Yeast etc  No ned fro routine gyne with hysterectomy for benign reasons  Patient Care Team: Burnis Medin, MD as PCP - General Dorothy Spark, MD as PCP - Cardiology (Cardiology) Selinda Orion, MD (Inactive) (Obstetrics and Gynecology) Servando Salina, MD as Consulting Physician (Obstetrics and Gynecology) Patient Instructions  Attention to lifestyle intervention healthy eating and exercise . For next check   Check with your gyne if   dermaititis still problematic   Get mammogram.  Get your eye  exam  consdier   shingrix vaccnie.  Check on last tetanus shot  ( every 10 years)  Contact .    Preventive Care 40-64 Years, Female Preventive care refers to lifestyle choices and visits with your health care provider that  can promote health and wellness. What does preventive care include?   A yearly physical exam. This is also called an annual well check.  Dental exams once or twice a year.  Routine eye exams. Ask your health care provider how often you should have your eyes checked.  Personal lifestyle choices, including: ? Daily care of your teeth and  gums. ? Regular physical activity. ? Eating a healthy diet. ? Avoiding tobacco and drug use. ? Limiting alcohol use. ? Practicing safe sex. ? Taking low-dose aspirin daily starting at age 20. ? Taking vitamin and mineral supplements as recommended by your health care provider. What happens during an annual well check? The services and screenings done by your health care provider during your annual well check will depend on your age, overall health, lifestyle risk factors, and family history of disease. Counseling Your health care provider may ask you questions about your:  Alcohol use.  Tobacco use.  Drug use.  Emotional well-being.  Home and relationship well-being.  Sexual activity.  Eating habits.  Work and work Statistician.  Method of birth control.  Menstrual cycle.  Pregnancy history. Screening You may have the following tests or measurements:  Height, weight, and BMI.  Blood pressure.  Lipid and cholesterol levels. These may be checked every 5 years, or more frequently if you are over 17 years old.  Skin check.  Lung cancer screening. You may have this screening every year starting at age 79 if you have a 30-pack-year history of smoking and currently smoke or have quit within the past 15 years.  Colorectal cancer screening. All adults should have this screening starting at age 46 and continuing until age 67. Your health care provider may recommend screening at age 53. You will have tests every 1-10 years, depending on your results and the type of screening test. People at increased risk should start screening at an earlier age. Screening tests may include: ? Guaiac-based fecal occult blood testing. ? Fecal immunochemical test (FIT). ? Stool DNA test. ? Virtual colonoscopy. ? Sigmoidoscopy. During this test, a flexible tube with a tiny camera (sigmoidoscope) is used to examine your rectum and lower colon. The sigmoidoscope is inserted through your anus into  your rectum and lower colon. ? Colonoscopy. During this test, a long, thin, flexible tube with a tiny camera (colonoscope) is used to examine your entire colon and rectum.  Hepatitis C blood test.  Hepatitis B blood test.  Sexually transmitted disease (STD) testing.  Diabetes screening. This is done by checking your blood sugar (glucose) after you have not eaten for a while (fasting). You may have this done every 1-3 years.  Mammogram. This may be done every 1-2 years. Talk to your health care provider about when you should start having regular mammograms. This may depend on whether you have a family history of breast cancer.  BRCA-related cancer screening. This may be done if you have a family history of breast, ovarian, tubal, or peritoneal cancers.  Pelvic exam and Pap test. This may be done every 3 years starting at age 65. Starting at age 48, this may be done every 5 years if you have a Pap test in combination with an HPV test.  Bone density scan. This is done to screen for osteoporosis. You may have this scan if you are at high risk for osteoporosis. Discuss your test results, treatment options, and if necessary, the need for more tests with your health care provider. Vaccines Your health care provider  may recommend certain vaccines, such as:  Influenza vaccine. This is recommended every year.  Tetanus, diphtheria, and acellular pertussis (Tdap, Td) vaccine. You may need a Td booster every 10 years.  Varicella vaccine. You may need this if you have not been vaccinated.  Zoster vaccine. You may need this after age 56.  Measles, mumps, and rubella (MMR) vaccine. You may need at least one dose of MMR if you were born in 1957 or later. You may also need a second dose.  Pneumococcal 13-valent conjugate (PCV13) vaccine. You may need this if you have certain conditions and were not previously vaccinated.  Pneumococcal polysaccharide (PPSV23) vaccine. You may need one or two doses if  you smoke cigarettes or if you have certain conditions.  Meningococcal vaccine. You may need this if you have certain conditions.  Hepatitis A vaccine. You may need this if you have certain conditions or if you travel or work in places where you may be exposed to hepatitis A.  Hepatitis B vaccine. You may need this if you have certain conditions or if you travel or work in places where you may be exposed to hepatitis B.  Haemophilus influenzae type b (Hib) vaccine. You may need this if you have certain conditions. Talk to your health care provider about which screenings and vaccines you need and how often you need them. This information is not intended to replace advice given to you by your health care provider. Make sure you discuss any questions you have with your health care provider. Document Released: 09/14/2015 Document Revised: 10/08/2017 Document Reviewed: 06/19/2015 Elsevier Interactive Patient Education  2019 Idalia K. Panosh M.D.

## 2018-11-05 NOTE — Patient Instructions (Addendum)
Attention to lifestyle intervention healthy eating and exercise . For next check   Check with your gyne if   dermaititis still problematic   Get mammogram.  Get your eye  exam  consdier   shingrix vaccnie.  Check on last tetanus shot  ( every 10 years)  Contact .    Preventive Care 40-64 Years, Female Preventive care refers to lifestyle choices and visits with your health care provider that can promote health and wellness. What does preventive care include?   A yearly physical exam. This is also called an annual well check.  Dental exams once or twice a year.  Routine eye exams. Ask your health care provider how often you should have your eyes checked.  Personal lifestyle choices, including: ? Daily care of your teeth and gums. ? Regular physical activity. ? Eating a healthy diet. ? Avoiding tobacco and drug use. ? Limiting alcohol use. ? Practicing safe sex. ? Taking low-dose aspirin daily starting at age 23. ? Taking vitamin and mineral supplements as recommended by your health care provider. What happens during an annual well check? The services and screenings done by your health care provider during your annual well check will depend on your age, overall health, lifestyle risk factors, and family history of disease. Counseling Your health care provider may ask you questions about your:  Alcohol use.  Tobacco use.  Drug use.  Emotional well-being.  Home and relationship well-being.  Sexual activity.  Eating habits.  Work and work Statistician.  Method of birth control.  Menstrual cycle.  Pregnancy history. Screening You may have the following tests or measurements:  Height, weight, and BMI.  Blood pressure.  Lipid and cholesterol levels. These may be checked every 5 years, or more frequently if you are over 14 years old.  Skin check.  Lung cancer screening. You may have this screening every year starting at age 42 if you have a 30-pack-year  history of smoking and currently smoke or have quit within the past 15 years.  Colorectal cancer screening. All adults should have this screening starting at age 1 and continuing until age 58. Your health care provider may recommend screening at age 62. You will have tests every 1-10 years, depending on your results and the type of screening test. People at increased risk should start screening at an earlier age. Screening tests may include: ? Guaiac-based fecal occult blood testing. ? Fecal immunochemical test (FIT). ? Stool DNA test. ? Virtual colonoscopy. ? Sigmoidoscopy. During this test, a flexible tube with a tiny camera (sigmoidoscope) is used to examine your rectum and lower colon. The sigmoidoscope is inserted through your anus into your rectum and lower colon. ? Colonoscopy. During this test, a long, thin, flexible tube with a tiny camera (colonoscope) is used to examine your entire colon and rectum.  Hepatitis C blood test.  Hepatitis B blood test.  Sexually transmitted disease (STD) testing.  Diabetes screening. This is done by checking your blood sugar (glucose) after you have not eaten for a while (fasting). You may have this done every 1-3 years.  Mammogram. This may be done every 1-2 years. Talk to your health care provider about when you should start having regular mammograms. This may depend on whether you have a family history of breast cancer.  BRCA-related cancer screening. This may be done if you have a family history of breast, ovarian, tubal, or peritoneal cancers.  Pelvic exam and Pap test. This may be done every 3 years  starting at age 28. Starting at age 50, this may be done every 5 years if you have a Pap test in combination with an HPV test.  Bone density scan. This is done to screen for osteoporosis. You may have this scan if you are at high risk for osteoporosis. Discuss your test results, treatment options, and if necessary, the need for more tests with your  health care provider. Vaccines Your health care provider may recommend certain vaccines, such as:  Influenza vaccine. This is recommended every year.  Tetanus, diphtheria, and acellular pertussis (Tdap, Td) vaccine. You may need a Td booster every 10 years.  Varicella vaccine. You may need this if you have not been vaccinated.  Zoster vaccine. You may need this after age 32.  Measles, mumps, and rubella (MMR) vaccine. You may need at least one dose of MMR if you were born in 1957 or later. You may also need a second dose.  Pneumococcal 13-valent conjugate (PCV13) vaccine. You may need this if you have certain conditions and were not previously vaccinated.  Pneumococcal polysaccharide (PPSV23) vaccine. You may need one or two doses if you smoke cigarettes or if you have certain conditions.  Meningococcal vaccine. You may need this if you have certain conditions.  Hepatitis A vaccine. You may need this if you have certain conditions or if you travel or work in places where you may be exposed to hepatitis A.  Hepatitis B vaccine. You may need this if you have certain conditions or if you travel or work in places where you may be exposed to hepatitis B.  Haemophilus influenzae type b (Hib) vaccine. You may need this if you have certain conditions. Talk to your health care provider about which screenings and vaccines you need and how often you need them. This information is not intended to replace advice given to you by your health care provider. Make sure you discuss any questions you have with your health care provider. Document Released: 09/14/2015 Document Revised: 10/08/2017 Document Reviewed: 06/19/2015 Elsevier Interactive Patient Education  2019 [employer].

## 2018-11-06 ENCOUNTER — Other Ambulatory Visit: Payer: Self-pay | Admitting: Internal Medicine

## 2018-11-08 ENCOUNTER — Ambulatory Visit (INDEPENDENT_AMBULATORY_CARE_PROVIDER_SITE_OTHER): Payer: 59 | Admitting: Internal Medicine

## 2018-11-08 ENCOUNTER — Encounter: Payer: Self-pay | Admitting: Internal Medicine

## 2018-11-08 VITALS — BP 118/68 | HR 77 | Temp 98.8°F | Ht 64.0 in | Wt 143.3 lb

## 2018-11-08 DIAGNOSIS — Z6379 Other stressful life events affecting family and household: Secondary | ICD-10-CM | POA: Diagnosis not present

## 2018-11-08 DIAGNOSIS — Z0000 Encounter for general adult medical examination without abnormal findings: Secondary | ICD-10-CM

## 2018-11-08 DIAGNOSIS — E785 Hyperlipidemia, unspecified: Secondary | ICD-10-CM | POA: Diagnosis not present

## 2018-11-08 DIAGNOSIS — E119 Type 2 diabetes mellitus without complications: Secondary | ICD-10-CM

## 2018-11-08 DIAGNOSIS — Z79899 Other long term (current) drug therapy: Secondary | ICD-10-CM

## 2018-11-08 DIAGNOSIS — I10 Essential (primary) hypertension: Secondary | ICD-10-CM

## 2018-11-24 DIAGNOSIS — L570 Actinic keratosis: Secondary | ICD-10-CM | POA: Diagnosis not present

## 2018-11-24 DIAGNOSIS — L821 Other seborrheic keratosis: Secondary | ICD-10-CM | POA: Diagnosis not present

## 2018-11-24 DIAGNOSIS — D2262 Melanocytic nevi of left upper limb, including shoulder: Secondary | ICD-10-CM | POA: Diagnosis not present

## 2018-12-13 ENCOUNTER — Other Ambulatory Visit: Payer: Self-pay | Admitting: Internal Medicine

## 2018-12-13 DIAGNOSIS — E785 Hyperlipidemia, unspecified: Secondary | ICD-10-CM

## 2018-12-21 DIAGNOSIS — B373 Candidiasis of vulva and vagina: Secondary | ICD-10-CM | POA: Diagnosis not present

## 2018-12-24 ENCOUNTER — Other Ambulatory Visit: Payer: Self-pay | Admitting: Internal Medicine

## 2019-01-18 ENCOUNTER — Ambulatory Visit (INDEPENDENT_AMBULATORY_CARE_PROVIDER_SITE_OTHER): Payer: 59 | Admitting: Internal Medicine

## 2019-01-18 ENCOUNTER — Encounter: Payer: Self-pay | Admitting: Internal Medicine

## 2019-01-18 ENCOUNTER — Ambulatory Visit: Payer: Self-pay | Admitting: *Deleted

## 2019-01-18 ENCOUNTER — Other Ambulatory Visit: Payer: Self-pay

## 2019-01-18 DIAGNOSIS — E119 Type 2 diabetes mellitus without complications: Secondary | ICD-10-CM | POA: Diagnosis not present

## 2019-01-18 DIAGNOSIS — Z79899 Other long term (current) drug therapy: Secondary | ICD-10-CM

## 2019-01-18 DIAGNOSIS — N763 Subacute and chronic vulvitis: Secondary | ICD-10-CM

## 2019-01-18 NOTE — Progress Notes (Signed)
   Virtual Visit via Telephone Note  I connected with@ on 01/18/19 at  2:30 PM EDT by telephone and verified that I am speaking with the correct person using two identifiers.    the limitations, risks, security and privacy concerns of performing an evaluation and management service by telephone and the availability of in person appointments. I also discussed with the patient that there may be a patient responsible charge related to this service. The patient expressed understanding and agreed to proceed. Patient   Could not get her internet to work for this visit so using phone visit  Location patient: home Location provider: work office Participants present for the call: patient, provider Patient did not have a visit in the prior 7 days to address this/these issue(s).   History of Present Illness: See messaging   Having recurrent itchy area on labia rx by urologis and gyne yeast and other but keeps on going and back although rx does help some   To have   bx this week but wonders how much could be from jardiance      Observations/Objective: Patient sounds cheerful and well on the phone. I do not appreciate any SOB. Speech and thought processing are grossly intact. Patient reported vitals:  Assessment and Plan:  Chronic vulvitis - since fall of 2019   Type 2 diabetes mellitus without complication, without long-term current use of insulin ([place])  Medication management    Uncertain if rom jardiance or triggered but such  Options discussed   Follow Up Instructions:  recurreint  On going vulvitis?    Disc opt go off  jardiance and other meds possible but prefers no injectables  And no guarantees of  Improvement   Proceed with bs for more information . Disc ? For her GYNE  And if she thinks we need to go off jardiance we can do t his also  She cannot take metformin cause of se  In past   [id number] [id number] [id number] [id number] 21-30 I did not refer this patient for an OV in the next 24 hours for  this/these issue(s).  I discussed the assessment and treatment plan with the patient. The patient was provided an opportunity to ask questions and all were answered. The patient agreed with the plan and demonstrated an understanding of the instructions.   The patient was advised to call back or seek an in-person evaluation if the symptoms worsen or if the condition fails to improve as anticipated.  I provided 11 minutes of non-face-to-face time during this encounter.   Shanon Ace, MD

## 2019-01-18 NOTE — Telephone Encounter (Signed)
Need more information please contact her    Do we need a telephone or video visit ? To discuss medication  (Any notes from her gyne would be helpful)

## 2019-01-18 NOTE — Telephone Encounter (Signed)
Pt having what she thought was a yeast infection that started in Nov of last year. She was put on several medications for that and they worked and then the symptoms would come back, itching, redness and irritation. She saw her OB GYN and was put on other medication for the symptoms and cleared and then came back. The patient thinks this is a side effect of jardiance that she started on last year of May. She started having the symptoms she thinks in November.  She is scheduled to have a biopsy of her vaginal/labia lining on Friday.  She is requesting a call back from her provider.  Flow at [facility] [place] notified. Routing to [facility] [place].  Reason for Disposition . Caller has URGENT medication question about med that PCP prescribed and triager unable to answer question  Answer Assessment - Initial Assessment Questions 1. SYMPTOMS: "Do you have any symptoms?"     yes 2. SEVERITY: If symptoms are present, ask "Are they mild, moderate or severe?"    Moderate to severe  Protocols used: MEDICATION QUESTION CALL-A-AH

## 2019-01-18 NOTE — Telephone Encounter (Signed)
Spoke with the patient. A Virtual appointment has been made.

## 2019-01-21 DIAGNOSIS — Z1231 Encounter for screening mammogram for malignant neoplasm of breast: Secondary | ICD-10-CM | POA: Diagnosis not present

## 2019-02-24 ENCOUNTER — Other Ambulatory Visit: Payer: Self-pay | Admitting: Internal Medicine

## 2019-03-10 ENCOUNTER — Ambulatory Visit: Payer: 59 | Admitting: Internal Medicine

## 2019-03-14 ENCOUNTER — Ambulatory Visit: Payer: 59 | Admitting: Internal Medicine

## 2019-03-18 NOTE — Progress Notes (Signed)
Chief Complaint  Patient presents with  . Follow-up  . Diabetes  . Medication Management  . Hypertension    HPI: Valerie Gates 63 y.o. come in for Chronic disease management   DM: Not eating as well .  Since covid  Snacking  But retired Jun 1 and walking more  And active  Trying to get back on track for blood sugar  Doesn't want to  Prick her finger at this time     BP:    bp has been good.      102  Down to 5 mg  beneicar  ? Can she stop?  Initially was at 135 range when med began   Labial itching :  bx   On outside labia no lichen sclerosis and no years    itchings and scratches   No lichen sclerosis  .   Using yellow cream and clear cream.   To flare in days.   Onset    November .  Is now on a wean   Cream over weeks    The med helps but comes back  ? If Jardiance  ROS: See pertinent positives and negatives per HPI. Feels goo otherwise   Past Medical History:  Diagnosis Date  . Allergic rhinitis   . Allergy    seasonal  . Anxiety   . Arthritis    lower right back   . Cancer ([place])    skin cancer-scalp  . Diabetes mellitus without complication ([place])   . History of skin cancer   . Hx of adenomatous polyp of colon 03/17/2018  . Hypertension   . Osteopenia     Family History  Problem Relation Age of Onset  . Diabetes Other   . Hypertension Other   . Lung cancer Other   . Stroke Other        1st degree female <50  . Drug abuse Daughter   . Nephrolithiasis Daughter   . Colon polyps Mother   . Liver disease Sister   . Pancreatic cancer Sister   . Colon cancer Neg Hx   . Rectal cancer Neg Hx   . Stomach cancer Neg Hx   . Esophageal cancer Neg Hx     Social History   Socioeconomic History  . Marital status: Divorced    Spouse name: Not on file  . Number of children: Not on file  . Years of education: Not on file  . Highest education level: Not on file  Occupational History  . Not on file  Social Needs  . Financial resource strain: Not on file  . Food  insecurity    Worry: Not on file    Inability: Not on file  . Transportation needs    Medical: Not on file    Non-medical: Not on file  Tobacco Use  . Smoking status: Former Research scientist (life sciences)  . Smokeless tobacco: Never Used  . Tobacco comment: Quit 35 years ago  Substance and Sexual Activity  . Alcohol use: No  . Drug use: No  . Sexual activity: Not on file  Lifestyle  . Physical activity    Days per week: Not on file    Minutes per session: Not on file  . Stress: Not on file  Relationships  . Social Herbalist on phone: Not on file    Gets together: Not on file    Attends religious service: Not on file    Active member of club or organization:  Not on file    Attends meetings of clubs or organizations: Not on file    Relationship status: Not on file  Other Topics Concern  . 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 fexofenadine (ALLEGRA) 180 MG tablet Take 180 mg by mouth daily as needed for allergies or rhinitis.     . fluticasone (FLONASE) 50 MCG/ACT nasal spray SPRAY 2 SPRAYS INTO EACH NOSTRIL EVERY DAY 16 mL 3  . JARDIANCE 10 MG TABS tablet TAKE 1 TABLET BY MOUTH BY MOUTH DAILY. 30 tablet 3  . LORazepam (ATIVAN) 0.5 MG tablet TAKE 1 TABLET (0.5 MG TOTAL) BY MOUTH 2 TIMES DAILY AS NEEDED FOR ANXIETY. CAN TAKE 2 IF NEEDED 30 tablet 0  . naproxen (NAPROSYN) 500 MG tablet Take 500 mg by mouth as needed.    Marland Kitchen olmesartan (BENICAR) 20 MG tablet Take 10 mg by mouth daily.    . rosuvastatin (CRESTOR) 5 MG tablet TAKE 1 TABLET BY MOUTH EVERY DAY 30 tablet 3  . valACYclovir (VALTREX) 1000 MG tablet TAKE 1/2 TABLET BY MOUTH TWICE A DAY FOR 3 DAYS AS NEEDED 12 tablet 3  . triamcinolone ointment (KENALOG) 0.5 % Apply to the labia      Facility-Administered Medications Prior to Visit  Medication Dose Route Frequency Provider Last Rate Last Dose  . 0.9 %  sodium chloride infusion  500 mL Intravenous Once Gatha Mayer, MD         EXAM:  BP 102/72 (BP Location: Right Arm, Patient Position: Sitting, Cuff Size: Normal)   Pulse 72   Temp 97.6 F (36.4 C) (Oral)   Wt 143 lb 3.2 oz (65 kg)   LMP  (LMP Unknown)   SpO2 96%   BMI 24.58 kg/m   Body mass index is 24.58 kg/m.  GENERAL: vitals reviewed and listed above, alert, oriented, appears well hydrated and in no acute distress HEENT: atraumatic, conjunctiva  clear, no obvious abnormalities on inspection of external nose and ears  NECK: no obvious masses on inspection palpation  LUNGS: clear to auscultation bilaterally, no wheezes, rales or rhonchi, good air movement CV: HRRR, no clubbing cyanosis or  peripheral edema nl cap refill  MS: moves all extremities without noticeable focal  abnormality PSYCH: pleasant and cooperative, no obvious depression or anxiety Lab Results  Component Value Date   WBC 5.3 11/01/2018   HGB 13.9 11/01/2018   HCT 40.9 11/01/2018   PLT 205.0 11/01/2018   GLUCOSE 111 (H) 11/01/2018   CHOL 170 11/01/2018   TRIG 75.0 11/01/2018   HDL 54.40 11/01/2018   LDLDIRECT 152.9 06/26/2009   LDLCALC 101 (H) 11/01/2018   ALT 14 11/01/2018   AST 13 11/01/2018   NA 145 11/01/2018   K 3.9 11/01/2018   CL 110 11/01/2018   CREATININE 0.79 11/01/2018   BUN 25 (H) 11/01/2018   CO2 28 11/01/2018   TSH 1.29 11/01/2018   HGBA1C 6.7 (A) 03/21/2019   MICROALBUR <0.7 11/01/2018   BP Readings from Last 3 Encounters:  03/21/19 102/72  11/08/18 118/68  09/13/18 106/70    ASSESSMENT AND PLAN:  Discussed the following assessment and plan:   ICD-10-CM  1. Type 2 diabetes mellitus without complication, without long-term current use of insulin (HCC)  E11.9 POC HgB A1c   improving disc scm plan   2. Medication management  Z79.899   3. Essential  hypertension  I10    ok to wean if needed   4. Chronic vulvitis  N76.3    under care neg bx uncxertain if from jardiacne no yeast   At this time stay on jardiance and con lsi    If  Vulvar itching doesn't subside on rx  Can do a trial off  Med but hesitant to do since  Doing well in every other way and had had se of meds  Consider oral ozempic   But at this time plan 6 mos rov   Of cpx and labs  -Patient advised to return or notify health care team  if  new concerns arise.  Patient Instructions   If going off jardiance  bp control may not be as good    But can try off benicar and see if controlled  120/80 and below  After a month of so off med.   Consider rybelsus  ( like an oral ozempic)  If we need to change       Medication.   Lets  Stay on jardiance for now.   Continue lifestyle intervention healthy eating and exercise .  Send me a message if  Not working out and we need to change meds       Standley Brooking. Panosh M.D.

## 2019-03-21 ENCOUNTER — Ambulatory Visit: Payer: 59 | Admitting: Internal Medicine

## 2019-03-21 ENCOUNTER — Encounter: Payer: Self-pay | Admitting: Internal Medicine

## 2019-03-21 ENCOUNTER — Other Ambulatory Visit: Payer: Self-pay

## 2019-03-21 VITALS — BP 102/72 | HR 72 | Temp 97.6°F | Wt 143.2 lb

## 2019-03-21 DIAGNOSIS — I10 Essential (primary) hypertension: Secondary | ICD-10-CM | POA: Diagnosis not present

## 2019-03-21 DIAGNOSIS — Z79899 Other long term (current) drug therapy: Secondary | ICD-10-CM | POA: Diagnosis not present

## 2019-03-21 DIAGNOSIS — E119 Type 2 diabetes mellitus without complications: Secondary | ICD-10-CM

## 2019-03-21 DIAGNOSIS — N763 Subacute and chronic vulvitis: Secondary | ICD-10-CM

## 2019-03-21 LAB — POCT GLYCOSYLATED HEMOGLOBIN (HGB A1C): Hemoglobin A1C: 6.7 % — AB (ref 4.0–5.6)

## 2019-03-21 NOTE — Patient Instructions (Addendum)
  If going off jardiance  bp control may not be as good    But can try off benicar and see if controlled  120/80 and below  After a month of so off med.   Consider rybelsus  ( like an oral ozempic)  If we need to change       Medication.   Lets  Stay on jardiance for now.   Continue lifestyle intervention healthy eating and exercise .  Send me a message if  Not working out and we need to change meds

## 2019-03-24 ENCOUNTER — Other Ambulatory Visit: Payer: Self-pay | Admitting: Internal Medicine

## 2019-04-11 ENCOUNTER — Other Ambulatory Visit: Payer: Self-pay | Admitting: Internal Medicine

## 2019-04-11 DIAGNOSIS — E785 Hyperlipidemia, unspecified: Secondary | ICD-10-CM

## 2019-04-21 ENCOUNTER — Other Ambulatory Visit: Payer: Self-pay | Admitting: Internal Medicine

## 2019-04-21 ENCOUNTER — Telehealth: Payer: Self-pay | Admitting: Internal Medicine

## 2019-04-21 NOTE — Telephone Encounter (Signed)
Pt need to speak to nurse concerning her diabetic medications. Please call pt

## 2019-04-21 NOTE — Telephone Encounter (Signed)
Pt states that she has issue vaginal itching. Pt finished the treatment pt believes it could be the jaridance and would like other options for medications that will not upset her stomach please advise

## 2019-06-07 ENCOUNTER — Ambulatory Visit: Payer: Self-pay | Admitting: *Deleted

## 2019-06-07 NOTE — Telephone Encounter (Signed)
Pt called in c/o a large bruise down the back of her left leg from her hip to just below her knee in the back.   She fell on wet pavement and her leg hyperextended way out 2 1/2 weeks ago.    She is concerned that she may have a blood clot.   The bruise is so large I'm just wondering. She can walk on her left leg but she is limping.  I warm transferred her call into the office to [facility] to be scheduled.  I sent my triage notes to Dr. Velora Mediate office.   Reason for Disposition . Large swelling or bruise > 2 inches (5 cm)  Answer Assessment - Initial Assessment Questions 1. MECHANISM: "How did the injury happen?" (e.g., twisting injury, direct blow)      Below my butt down to my knee is very bruised on left leg.   2. ONSET: "When did the injury happen?" (Minutes or hours ago)      Thursday night I fell and the bruise has spread down..   3. LOCATION: "Where is the injury located?"      Left leg with bruise down to my knee from my butt.    When I fell my leg hyperextended way way the left one.    4. APPEARANCE of INJURY: "What does the injury look like?"  (e.g., deformity of leg)     Very bruised.   This is the worst bruise I've ever had.  This coming [place]. Night will be 3 weeks.   5. SEVERITY: "Can you put weight on that leg?" "Can you walk?"      Yes but I'm not putting all  My wt on it.   I'm starting to walk better.   I'm more sore. 6. SIZE: For cuts, bruises, or swelling, ask: "How large is it?" (e.g., inches or centimeters)      Large bruise. 7. PAIN: "Is there pain?" If so, ask: "How bad is the pain?"  (Scale 1-10; or mild, moderate, severe)     Yes  It's more like soreness.  I'm limping. 8. TETANUS: For any breaks in the skin, ask: "When was the last tetanus booster?"     Not asked 9. OTHER SYMPTOMS: "Do you have any other symptoms?"      No just my left leg 10. PREGNANCY: "Is there any chance you are pregnant?" "When was your last menstrual period?"       N/A due to  age  Protocols used: [place]

## 2019-06-07 NOTE — Telephone Encounter (Signed)
Pt scheduled with provider.

## 2019-06-08 ENCOUNTER — Ambulatory Visit: Payer: 59 | Admitting: Family Medicine

## 2019-06-08 ENCOUNTER — Other Ambulatory Visit: Payer: Self-pay

## 2019-06-08 VITALS — BP 118/60 | HR 106 | Temp 98.0°F | Wt 144.7 lb

## 2019-06-08 DIAGNOSIS — S76312A Strain of muscle, fascia and tendon of the posterior muscle group at thigh level, left thigh, initial encounter: Secondary | ICD-10-CM

## 2019-06-08 NOTE — Patient Instructions (Signed)
Hamstring Strain  A hamstring strain happens when the muscles in the back of the thighs (hamstring muscles) are overstretched or torn. The hamstring muscles are used in straightening the hips, bending the knees, and pulling back the legs. This injury is often called a pulled hamstring muscle. The tissue that connects the muscle to a bone (tendon) may also be affected. The severity of a hamstring strain may be rated in degrees or grades. First-degree (or grade 1) strains have the least amount of muscle tearing and pain. Second-degree and third-degree (grade 2 and 3) strains have increasingly more tearing and pain. What are the causes? This condition is caused by a sudden, violent force being placed on the hamstring muscles, stretching them too far. This often happens during activities that involve running, jumping, kicking, or weight lifting. What increases the risk? Hamstring strains are especially common in athletes. The following factors may also make you more likely to develop this condition:  Having low strength, endurance, or flexibility of the hamstring muscles.  Doing high-impact physical activity or sports.  Having poor physical fitness.  Having a previous leg injury.  Having tired (fatigued) muscles. What are the signs or symptoms? Symptoms of this condition include:  Pain in the back of the thigh.  Swelling.  Bruising.  Muscle spasms.  Trouble moving the affected muscle because of pain. For severe strains, you may feel popping or snapping in the back of your thigh when the injury occurs. How is this diagnosed? This condition is diagnosed based on your symptoms, your medical history, and a physical exam. How is this treated? Treatment for this condition usually involves:  Protecting, resting, icing, applying compression, and elevating the injured area (PRICE therapy).  Medicines. Your health care provider may recommend medicines to help reduce pain or inflammation.   Doing exercises to regain strength and flexibility in the muscles. Your health care provider will tell you when it is okay to begin exercising. Follow these instructions at home: PRICE therapy Use PRICE therapy to promote muscle healing during the first 2-3 days after your injury, or as told by your health care provider.  Protect the muscle from being injured again.  Rest your injury. This usually involves limiting your normal activities and not using the injured hamstring muscle. Talk with your health care provider about how you should limit your activities.  Apply ice to the injured area: ? Put ice in a plastic bag. ? Place a towel between your skin and the bag. ? Leave the ice on for 20 minutes, 2-3 times a day. After the third day, switch to applying heat as told.  Put pressure (compression) on your injured hamstring by wrapping it with an elastic bandage. Be careful not to wrap it too tightly. That may interfere with blood circulation or may increase swelling.  Raise (elevate) your injured hamstring above the level of your heart as often as possible. When you are lying down, you can do this by putting a pillow under your thigh.  Activity  Begin exercising or stretching only as told by your health care provider.  Do not return to full activity level until your health care provider approves.  To help prevent muscle strains in the future, always warm up before exercising and stretch afterward. General instructions  Take over-the-counter and prescription medicines only as told by your health care provider.  If directed, apply heat to the affected area as often as told by your health care provider. Use the heat source that your  health care provider recommends, such as a moist heat pack or a heating pad. ? Place a towel between your skin and the heat source. ? Leave the heat on for 20-30 minutes. ? Remove the heat if your skin turns bright red. This is especially important if you are  unable to feel pain, heat, or cold. You may have a greater risk of getting burned.  Keep all follow-up visits as told by your health care provider. This is important. Contact a health care provider if you have:  Increasing pain or swelling in the injured area.  Numbness, tingling, or a significant loss of strength in the injured area. Get help right away if:  Your foot or your toes become cold or turn blue. Summary  A hamstring strain happens when the muscles in the back of the thighs (hamstring muscles) are overstretched or torn.  This injury can be caused by a sudden, violent force being placed on the hamstring muscles, causing them to stretch too far.  Symptoms include pain, swelling, and muscle spasms in the injured area.  Treatment includes what is called PRICE therapy: protecting, resting, icing, applying compression, and elevating the injured area. This information is not intended to replace advice given to you by your health care provider. Make sure you discuss any questions you have with your health care provider. Document Released: 05/13/2001 Document Revised: 07/31/2017 Document Reviewed: 07/16/2017 Elsevier Patient Education  [place].  Gentle stretches several times daily Gradually progress with walking as tolerated Warm bathes/soaks daily.

## 2019-06-08 NOTE — Progress Notes (Signed)
  Subjective:     Patient ID: Valerie Gates, female   DOB: 10-12-1955, 63 y.o.   MRN: [id number]  HPI Patient seen with left lower extremity pain following injury almost 3 weeks ago.  She states she was in the rain and was going to get in her car and there was a painted white line on the pavement that was very slick and basically her foot slipped and she went down.  She thinks that she overstretched her left hamstring.  She recalls landing on the left side.  She had some bruising which appeared 2 days later in the posterior thigh region.  She had pain with ambulation right away in her hamstring region.  No actual hip pain.  No pelvic pain.  No head injury.  No other injuries reported.  She is usually very active.  Her walking has been limited because of the injury.  Past Medical History:  Diagnosis Date  . Allergic rhinitis   . Allergy    seasonal  . Anxiety   . Arthritis    lower right back   . Cancer ([place])    skin cancer-scalp  . Diabetes mellitus without complication ([place])   . History of skin cancer   . Hx of adenomatous polyp of colon 03/17/2018  . Hypertension   . Osteopenia    Past Surgical History:  Procedure Laterality Date  . BLADDER SURGERY    . COLONOSCOPY    . skin cancer removed on head      reports that she has quit smoking. She has never used smokeless tobacco. She reports that she does not drink alcohol or use drugs. family history includes Colon polyps in her mother; Diabetes in an other family member; Drug abuse in her daughter; Hypertension in an other family member; Liver disease in her sister; Lung cancer in an other family member; Nephrolithiasis in her daughter; Pancreatic cancer in her sister; Stroke in an other family member. Allergies  Allergen Reactions  . Metformin Diarrhea  . Metformin And Related Diarrhea     Review of Systems  Neurological: Negative for weakness and numbness.       Objective:   Physical Exam Constitutional:      Appearance:  Normal appearance.  Cardiovascular:     Rate and Rhythm: Normal rate and regular rhythm.  Musculoskeletal:     Comments: Left lower extremity reveals excellent range of motion left hip.  No lateral hip tenderness.  Knee full range of motion.  No effusion.  She has extensive ecchymosis involving the posterior thigh region from upper one third of the thigh down toward the knee.  No hematoma palpated.  Minimal pain with knee flexion against resistance  Neurological:     Mental Status: She is alert.        Assessment:     Acute left hamstring strain.  Suspect partial tear.  No significant weakness    Plan:     -We recommended some gentle heat as tolerated and gradually resume walking as tolerated -Recommended gentle stretches as healing progresses -Touch base if she is not seeing improvement over the next couple weeks.  Consider sports medicine referral if she is not seeing ongoing improvement  Eulas Post MD [facility]

## 2019-08-15 ENCOUNTER — Other Ambulatory Visit: Payer: Self-pay | Admitting: Internal Medicine

## 2019-08-15 DIAGNOSIS — E785 Hyperlipidemia, unspecified: Secondary | ICD-10-CM

## 2019-08-17 ENCOUNTER — Telehealth: Payer: Self-pay | Admitting: Internal Medicine

## 2019-08-17 NOTE — Telephone Encounter (Signed)
Message Routed to PCP CMA 

## 2019-08-17 NOTE — Telephone Encounter (Signed)
Patient called and would like a call back from Dr. Regis Bill CMA regarding her medication fluticasone (FLONASE) 50 MCG/ACT nasal spray. She said that her insurnace called her and told her to talk to her pcp about the cost of the medication. Please call patient back. Thanks.

## 2019-08-18 ENCOUNTER — Other Ambulatory Visit: Payer: Self-pay | Admitting: Internal Medicine

## 2019-08-20 ENCOUNTER — Other Ambulatory Visit: Payer: Self-pay | Admitting: Internal Medicine

## 2019-08-22 NOTE — Telephone Encounter (Signed)
Pt states that she will buy over the counter medication

## 2019-08-22 NOTE — Telephone Encounter (Signed)
So she should  Find out from insurance and  pharmacy what inhaled  nasal steroid  Cost and coverage  Also if she has tried others   In that class and I can advise .

## 2019-08-22 NOTE — Telephone Encounter (Signed)
Please advise insurance will not cover since it can be otc

## 2019-09-05 ENCOUNTER — Telehealth: Payer: Self-pay | Admitting: Cardiology

## 2019-09-05 NOTE — Telephone Encounter (Signed)
New Message  Pt c/o medication issue:  1. Name of Medication: rosuvastatin (CRESTOR) 5 MG tablet  2. How are you currently taking this medication (dosage and times per day)? As directed  3. Are you having a reaction (difficulty breathing--STAT)? No  4. What is your medication issue? Patient states that medication is no longer affordable and would like to see if there is a medication that compares to it that is more affordable. Please give patient a call back to discuss.

## 2019-09-06 NOTE — Telephone Encounter (Signed)
We can try 10 mg of atorvastatin.

## 2019-09-06 NOTE — Telephone Encounter (Signed)
Left a message for the pt to call back to obtain more details regarding rosuvastatin cost.

## 2019-09-06 NOTE — Telephone Encounter (Signed)
Per review of the pts chart she has no allergies to any statins and has only ever tried Rosuvastatin so getting a tier exception would be difficult at this point. If the pt does not mind trying Atorvastatin or Simvastatin and Dr Meda Coffee is in agreement with her taking either of them then I can try a tier exception if the pt tries and fails one or maybe both of them.  Will forward to Dr Meda Coffee for advisement.

## 2019-09-06 NOTE — Telephone Encounter (Signed)
Pt is calling in to let Dr. Meda Coffee know that [facility] placed Rosuvastatin as a Tier 2 medication and the cost will go up from $10 to $45 a month.  Pt states that [facility] gave her 2 alternative medications to supplement this medication.  Pt states [employer] advised that they will cover atorvastatin or simvastatin at her normal $10 cost her rosuvastatin was. Pt states she just picked up a months worth of her rosuvastatin, but she does not want to pay the $45 a month.  Informed the pt that I will route this message to Dr. Meda Coffee and our Prior Helper Via to review and advise this matter.  Informed the pt that possibly [facility] may be able to call her [employer] and get her Rosuvastatin back down to her $10 cost.  Pt verbalized understanding and agrees with this plan.

## 2019-09-07 MED ORDER — ATORVASTATIN CALCIUM 10 MG PO TABS: 10.0000 mg | ORAL_TABLET | Freq: Every day | ORAL | 1 refills | Status: DC

## 2019-09-07 NOTE — Telephone Encounter (Signed)
Spoke with the pt and informed her that per [facility] and our Prior Authorization Nurse, they will not cover for her rosuvastatin and do a tier exception on this, for she has not failed any other statins. Informed the pt that Dr. Meda Coffee recommends that we discontinue her rosuvastatin and start her on atorvastatin 10 mg po daily.  Confirmed the pharmacy of choice with the pt. Pt verbalized understanding and agrees with this plan.

## 2019-09-19 ENCOUNTER — Telehealth: Payer: Self-pay | Admitting: *Deleted

## 2019-09-19 NOTE — Telephone Encounter (Signed)
Pt has been made appt to discuss

## 2019-09-19 NOTE — Telephone Encounter (Signed)
Copied from [place] [id number]. Topic: General - Inquiry >> Sep 19, 2019 10:04 AM Richardo Priest, NT wrote: Reason for CRM: Pt called in stating she would like to a cma about the jardiance giving her continuous yeast infections. Pt would like an alternative. Please advise.

## 2019-09-20 ENCOUNTER — Other Ambulatory Visit: Payer: Self-pay

## 2019-09-20 ENCOUNTER — Encounter: Payer: Self-pay | Admitting: Internal Medicine

## 2019-09-20 ENCOUNTER — Telehealth (INDEPENDENT_AMBULATORY_CARE_PROVIDER_SITE_OTHER): Payer: 59 | Admitting: Internal Medicine

## 2019-09-20 DIAGNOSIS — I10 Essential (primary) hypertension: Secondary | ICD-10-CM

## 2019-09-20 DIAGNOSIS — E785 Hyperlipidemia, unspecified: Secondary | ICD-10-CM

## 2019-09-20 DIAGNOSIS — Z79899 Other long term (current) drug therapy: Secondary | ICD-10-CM

## 2019-09-20 DIAGNOSIS — N763 Subacute and chronic vulvitis: Secondary | ICD-10-CM | POA: Diagnosis not present

## 2019-09-20 DIAGNOSIS — E119 Type 2 diabetes mellitus without complications: Secondary | ICD-10-CM

## 2019-09-20 NOTE — Progress Notes (Signed)
Virtual Visit via Video Note  I connected with@ on 09/20/19 at  3:30 PM EST by a video enabled telemedicine application and verified that I am speaking with the correct person using two identifiers. Location patient: home Location provider:work office Persons participating in the virtual visit: patient, provider  [place] national recommendations  regarding COVID 19 pandemic   video visit is advised over in office visit for this patient.  Patient aware  of the limitations of evaluation and management by telemedicine and  availability of in person appointments. and agreed to proceed.   HPI: Valerie Gates presents for video visit because of ongoing vaginal itching and inflammation.  She has had over the course the last 14 months multiple treatments for yeast over the counters and then topicals which were included Monistat terconazole nystatin and triamcinolone ointment.  She has had what sounds like 2 biopsies that only showed a normal yeast and inflammation she states that the area is really external and closer to her urethra than vaginal with no a lot of discharge.  She can keep it at [place] with multiple treatments but it never totally goes away at this point she is wants to stop the [employer] because she never had this problem before.  Does not check blood sugar because not indicated at this point was unable to tolerate Metformin because it gave her abdominal cramps in the middle the night.  Her last A1c was July and it was 6.7 down from the 7 range    ROS: See pertinent positives and negatives per HPI.  Past Medical History:  Diagnosis Date  . Allergic rhinitis   . Allergy    seasonal  . Anxiety   . Arthritis    lower right back   . Cancer ([place])    skin cancer-scalp  . Diabetes mellitus without complication ([place])   . History of skin cancer   . Hx of adenomatous polyp of colon 03/17/2018  . Hypertension   . Osteopenia     Past Surgical History:  Procedure Laterality Date  .  BLADDER SURGERY    . COLONOSCOPY    . skin cancer removed on head      Family History  Problem Relation Age of Onset  . Diabetes Other   . Hypertension Other   . Lung cancer Other   . Stroke Other        1st degree female <50  . Drug abuse Daughter   . Nephrolithiasis Daughter   . Colon polyps Mother   . Liver disease Sister   . Pancreatic cancer Sister   . Colon cancer Neg Hx   . Rectal cancer Neg Hx   . Stomach cancer Neg Hx   . Esophageal cancer Neg Hx     Social History   Tobacco Use  . Smoking status: Former Research scientist (life sciences)  . Smokeless tobacco: Never Used  . Tobacco comment: Quit 35 years ago  Substance Use Topics  . Alcohol use: No  . Drug use: No      Current Outpatient Medications:  .  atorvastatin (LIPITOR) 10 MG tablet, Take 1 tablet (10 mg total) by mouth daily., Disp: 90 tablet, Rfl: 1 .  fexofenadine (ALLEGRA) 180 MG tablet, Take 180 mg by mouth daily as needed for allergies or rhinitis. , Disp: , Rfl:  .  fluticasone (FLONASE) 50 MCG/ACT nasal spray, SPRAY 2 SPRAYS INTO EACH NOSTRIL EVERY DAY, Disp: 16 mL, Rfl: 3 .  LORazepam (ATIVAN) 0.5 MG tablet, TAKE 1 TABLET (0.5  MG TOTAL) BY MOUTH 2 TIMES DAILY AS NEEDED FOR ANXIETY. CAN TAKE 2 IF NEEDED, Disp: 30 tablet, Rfl: 0 .  naproxen (NAPROSYN) 500 MG tablet, Take 500 mg by mouth as needed., Disp: , Rfl:  .  olmesartan (BENICAR) 20 MG tablet, TAKE 1 TABLET BY MOUTH EVERY DAY MAY DECREASE TO 10MG  TABS PER DAY IF CONTROLLED, Disp: 30 tablet, Rfl: 5 .  triamcinolone ointment (KENALOG) 0.5 %, Apply to the labia, Disp: , Rfl:  .  valACYclovir (VALTREX) 1000 MG tablet, TAKE 1/2 TABLET BY MOUTH TWICE A DAY FOR 3 DAYS AS NEEDED, Disp: 12 tablet, Rfl: 3  Current Facility-Administered Medications:  .  0.9 %  sodium chloride infusion, 500 mL, Intravenous, Once, Gatha Mayer, MD  EXAM: BP Readings from Last 3 Encounters:  06/08/19 118/60  03/21/19 102/72  11/08/18 118/68    VITALS per patient if applicable:  GENERAL:  alert, oriented, appears well and in no acute distress  HEENT: atraumatic, conjunttiva clear, no obvious abnormalities on inspection of external nose and ears  NECK: normal movements of the head and neck  LUNGS: on inspection no signs of respiratory distress, breathing rate appears normal, no obvious gross SOB, gasping or wheezing  CV: no obvious cyanosis   PSYCH/NEURO: pleasant and cooperative, no obvious depression or anxiety, speech and thought processing grossly intact Lab Results  Component Value Date   WBC 5.3 11/01/2018   HGB 13.9 11/01/2018   HCT 40.9 11/01/2018   PLT 205.0 11/01/2018   GLUCOSE 111 (H) 11/01/2018   CHOL 170 11/01/2018   TRIG 75.0 11/01/2018   HDL 54.40 11/01/2018   LDLDIRECT 152.9 06/26/2009   LDLCALC 101 (H) 11/01/2018   ALT 14 11/01/2018   AST 13 11/01/2018   NA 145 11/01/2018   K 3.9 11/01/2018   CL 110 11/01/2018   CREATININE 0.79 11/01/2018   BUN 25 (H) 11/01/2018   CO2 28 11/01/2018   TSH 1.29 11/01/2018   HGBA1C 6.7 (A) 03/21/2019   MICROALBUR <0.7 11/01/2018    ASSESSMENT AND PLAN:  Discussed the following assessment and plan:    ICD-10-CM   1. Type 2 diabetes mellitus without complication, without long-term current use of insulin (HCC)  [id number] Basic metabolic panel    CBC with Differential/Platelet    Hemoglobin A1c    Hepatic function panel    Lipid panel    TSH    Microalbumin / creatinine urine ratio  2. Chronic vulvitis  0000000 Basic metabolic panel    CBC with Differential/Platelet    Hemoglobin A1c    Hepatic function panel    Lipid panel    TSH    Microalbumin / creatinine urine ratio  3. Medication management  [id number] Basic metabolic panel    CBC with Differential/Platelet    Hemoglobin A1c    Hepatic function panel    Lipid panel    TSH    Microalbumin / creatinine urine ratio  4. Essential hypertension  99991111 Basic metabolic panel    CBC with Differential/Platelet    Hemoglobin A1c    Hepatic function panel     Lipid panel    TSH    Microalbumin / creatinine urine ratio  5. Hyperlipidemia, unspecified hyperlipidemia type  99991111 Basic metabolic panel    CBC with Differential/Platelet    Hemoglobin A1c    Hepatic function panel    Lipid panel    TSH    Microalbumin / creatinine urine ratio   I agree with her that  the Jardiance may keep this vulvitis ongoing and never able to totally heal uncertain if triamcinolone can cause rebound but either way it is time to try off this medication. She is due for fasting labs soon orders placed to be done at the [place] office for flexibility After I get the results back we can make a plan for next step.  Not certain that lifestyle by itself will control the diabetes and A1c but there are other options that have various risk benefit. Patient agrees. Counseled.   Expectant management and discussion of plan and treatment with opportunity to ask questions and all were answered. The patient agreed with the plan and demonstrated an understanding of the instructions.   Advised to call back or seek an in-person evaluation if worsening  or having  further concerns . Return for lab fasting tomorrow [facility] and stop the jardiance and then depending on lab results. Shanon Ace, MD

## 2019-09-21 ENCOUNTER — Other Ambulatory Visit (INDEPENDENT_AMBULATORY_CARE_PROVIDER_SITE_OTHER): Payer: 59

## 2019-09-21 DIAGNOSIS — Z79899 Other long term (current) drug therapy: Secondary | ICD-10-CM

## 2019-09-21 DIAGNOSIS — N763 Subacute and chronic vulvitis: Secondary | ICD-10-CM | POA: Diagnosis not present

## 2019-09-21 DIAGNOSIS — E785 Hyperlipidemia, unspecified: Secondary | ICD-10-CM

## 2019-09-21 DIAGNOSIS — I10 Essential (primary) hypertension: Secondary | ICD-10-CM

## 2019-09-21 DIAGNOSIS — E119 Type 2 diabetes mellitus without complications: Secondary | ICD-10-CM

## 2019-09-21 LAB — CBC WITH DIFFERENTIAL/PLATELET
Basophils Absolute: 0 10*3/uL (ref 0.0–0.1)
Basophils Relative: 0.4 % (ref 0.0–3.0)
Eosinophils Absolute: 0 10*3/uL (ref 0.0–0.7)
Eosinophils Relative: 0.5 % (ref 0.0–5.0)
HCT: 43.6 % (ref 36.0–46.0)
Hemoglobin: 14.5 g/dL (ref 12.0–15.0)
Lymphocytes Relative: 31.1 % (ref 12.0–46.0)
Lymphs Abs: 1.6 10*3/uL (ref 0.7–4.0)
MCHC: 33.3 g/dL (ref 30.0–36.0)
MCV: 94.9 fl (ref 78.0–100.0)
Monocytes Absolute: 0.4 10*3/uL (ref 0.1–1.0)
Monocytes Relative: 7.5 % (ref 3.0–12.0)
Neutro Abs: 3 10*3/uL (ref 1.4–7.7)
Neutrophils Relative %: 60.5 % (ref 43.0–77.0)
Platelets: 195 10*3/uL (ref 150.0–400.0)
RBC: 4.59 Mil/uL (ref 3.87–5.11)
RDW: 12.6 % (ref 11.5–15.5)
WBC: 5 10*3/uL (ref 4.0–10.5)

## 2019-09-21 LAB — BASIC METABOLIC PANEL
BUN: 20 mg/dL (ref 6–23)
CO2: 29 mEq/L (ref 19–32)
Calcium: 9.2 mg/dL (ref 8.4–10.5)
Chloride: 108 mEq/L (ref 96–112)
Creatinine, Ser: 0.77 mg/dL (ref 0.40–1.20)
GFR: 75.79 mL/min (ref >60.00)
Glucose, Bld: 133 mg/dL — ABNORMAL HIGH (ref 70–99)
Potassium: 4 mEq/L (ref 3.5–5.1)
Sodium: 143 mEq/L (ref 135–145)

## 2019-09-21 LAB — HEPATIC FUNCTION PANEL
ALT: 15 U/L (ref 0–35)
AST: 15 U/L (ref 0–37)
Albumin: 4.2 g/dL (ref 3.5–5.2)
Alkaline Phosphatase: 70 U/L (ref 39–117)
Bilirubin, Direct: 0.1 mg/dL (ref 0.0–0.3)
Total Bilirubin: 0.6 mg/dL (ref 0.2–1.2)
Total Protein: 6.8 g/dL (ref 6.0–8.3)

## 2019-09-21 LAB — LIPID PANEL
Cholesterol: 160 mg/dL (ref 0–200)
HDL: 57.7 mg/dL (ref >39.00)
LDL Cholesterol: 82 mg/dL (ref 0–99)
NonHDL: 102.11
Total CHOL/HDL Ratio: 3
Triglycerides: 103 mg/dL (ref 0.0–149.0)
VLDL: 20.6 mg/dL (ref 0.0–40.0)

## 2019-09-21 LAB — MICROALBUMIN / CREATININE URINE RATIO
Creatinine,U: 96.2 mg/dL
Microalb Creat Ratio: 1.1 mg/g (ref 0.0–30.0)
Microalb, Ur: 1.1 mg/dL (ref 0.0–1.9)

## 2019-09-21 LAB — TSH: TSH: 1.46 u[IU]/mL (ref 0.35–4.50)

## 2019-09-21 LAB — HEMOGLOBIN A1C: Hgb A1c MFr Bld: 7.1 % — ABNORMAL HIGH (ref 4.6–6.5)

## 2019-09-21 NOTE — Progress Notes (Signed)
So albs good but a1c is up to 7.1  we should try  another med  in addition to your lifestyle changes   GLP1 class has oral form ryblesus ( instead of weekly  injectable trulicity or ozempic)  a branded medication but  usually covered and copay card   a piill once a day   , otherwise med like [place]  that  is not as  effective   and also expensive . Please advise   if ok to try the ozempic  comes in titration doses . If se we can send in  ( may need PA)

## 2019-09-23 ENCOUNTER — Telehealth (INDEPENDENT_AMBULATORY_CARE_PROVIDER_SITE_OTHER): Payer: 59 | Admitting: Internal Medicine

## 2019-09-23 ENCOUNTER — Encounter: Payer: Self-pay | Admitting: Internal Medicine

## 2019-09-23 ENCOUNTER — Other Ambulatory Visit: Payer: Self-pay

## 2019-09-23 DIAGNOSIS — E119 Type 2 diabetes mellitus without complications: Secondary | ICD-10-CM | POA: Diagnosis not present

## 2019-09-23 DIAGNOSIS — Z79899 Other long term (current) drug therapy: Secondary | ICD-10-CM | POA: Diagnosis not present

## 2019-09-23 DIAGNOSIS — T887XXA Unspecified adverse effect of drug or medicament, initial encounter: Secondary | ICD-10-CM

## 2019-09-23 MED ORDER — RYBELSUS 7 MG PO TABS: 7.0000 mg | ORAL_TABLET | Freq: Every day | ORAL | 3 refills | Status: DC

## 2019-09-23 NOTE — Progress Notes (Signed)
Virtual Visit via Video Note  I connected with@ on 09/23/19 at  2:00 PM EST by a video enabled telemedicine application and verified that I am speaking with the correct person using two identifiers. Location patient:vehicle  Location provider:work  office Persons participating in the virtual visit: patient, provider  [place]Ith national recommendations  regarding COVID 19 pandemic   video visit is advised over in office visit for this patient.  Patient aware  of the limitations of evaluation and management by telemedicine and  availability of in person appointments. and agreed to proceed.   HPI: Valerie Gates presents for video visit  To discuss  Medication    See last note from lab   Stopping the jardiance   Had releif of symtoms   Shortly therafter and no need for topicals any more  Feels mucg better  BUt her a1c I 7.1 and  Plan on getting down .  Declines any injections  And avoiding weight gain   meds   ROS: See pertinent positives and negatives per HPI.  Past Medical History:  Diagnosis Date  . Allergic rhinitis   . Allergy    seasonal  . Anxiety   . Arthritis    lower right back   . Cancer ([place])    skin cancer-scalp  . Diabetes mellitus without complication ([place])   . History of skin cancer   . Hx of adenomatous polyp of colon 03/17/2018  . Hypertension   . Osteopenia     Past Surgical History:  Procedure Laterality Date  . BLADDER SURGERY    . COLONOSCOPY    . skin cancer removed on head      Family History  Problem Relation Age of Onset  . Diabetes Other   . Hypertension Other   . Lung cancer Other   . Stroke Other        1st degree female <50  . Drug abuse Daughter   . Nephrolithiasis Daughter   . Colon polyps Mother   . Liver disease Sister   . Pancreatic cancer Sister   . Colon cancer Neg Hx   . Rectal cancer Neg Hx   . Stomach cancer Neg Hx   . Esophageal cancer Neg Hx     Social History   Tobacco Use  . Smoking status: Former Research scientist (life sciences)  . Smokeless  tobacco: Never Used  . Tobacco comment: Quit 35 years ago  Substance Use Topics  . Alcohol use: No  . Drug use: No      Current Outpatient Medications:  .  atorvastatin (LIPITOR) 10 MG tablet, Take 1 tablet (10 mg total) by mouth daily., Disp: 90 tablet, Rfl: 1 .  fexofenadine (ALLEGRA) 180 MG tablet, Take 180 mg by mouth daily as needed for allergies or rhinitis. , Disp: , Rfl:  .  fluticasone (FLONASE) 50 MCG/ACT nasal spray, SPRAY 2 SPRAYS INTO EACH NOSTRIL EVERY DAY, Disp: 16 mL, Rfl: 3 .  LORazepam (ATIVAN) 0.5 MG tablet, TAKE 1 TABLET (0.5 MG TOTAL) BY MOUTH 2 TIMES DAILY AS NEEDED FOR ANXIETY. CAN TAKE 2 IF NEEDED, Disp: 30 tablet, Rfl: 0 .  naproxen (NAPROSYN) 500 MG tablet, Take 500 mg by mouth as needed., Disp: , Rfl:  .  olmesartan (BENICAR) 20 MG tablet, TAKE 1 TABLET BY MOUTH EVERY DAY MAY DECREASE TO 10MG  TABS PER DAY IF CONTROLLED, Disp: 30 tablet, Rfl: 5 .  Semaglutide (RYBELSUS) 7 MG TABS, Take 7 mg by mouth daily. For diabetes, Disp: 30 tablet, Rfl: 3 .  triamcinolone ointment (KENALOG) 0.5 %, Apply to the labia, Disp: , Rfl:  .  valACYclovir (VALTREX) 1000 MG tablet, TAKE 1/2 TABLET BY MOUTH TWICE A DAY FOR 3 DAYS AS NEEDED, Disp: 12 tablet, Rfl: 3  Current Facility-Administered Medications:  .  0.9 %  sodium chloride infusion, 500 mL, Intravenous, Once, Gatha Mayer, MD  EXAM: BP Readings from Last 3 Encounters:  06/08/19 118/60  03/21/19 102/72  11/08/18 118/68    VITALS per patient if applicable:  GENERAL: alert, oriented, appears well and in no acute distress  HEENT: atraumatic, conjunttiva clear, no obvious abnormalities on inspection of external nose and ears  NECK: normal movements of the head and neck  LUNGS: on inspection no signs of respiratory distress, breathing rate appears normal, no obvious gross SOB, gasping or wheezing  CV: no obvious cyanosi MS: moves all visible extremities without noticeable abnormality  PSYCH/NEURO: pleasant and  cooperative, no obvious depression or anxiety, speech and thought processing grossly intact Lab Results  Component Value Date   WBC 5.0 09/21/2019   HGB 14.5 09/21/2019   HCT 43.6 09/21/2019   PLT 195.0 09/21/2019   GLUCOSE 133 (H) 09/21/2019   CHOL 160 09/21/2019   TRIG 103.0 09/21/2019   HDL 57.70 09/21/2019   LDLDIRECT 152.9 06/26/2009   LDLCALC 82 09/21/2019   ALT 15 09/21/2019   AST 15 09/21/2019   NA 143 09/21/2019   K 4.0 09/21/2019   CL 108 09/21/2019   CREATININE 0.77 09/21/2019   BUN 20 09/21/2019   CO2 29 09/21/2019   TSH 1.46 09/21/2019   HGBA1C 7.1 (H) 09/21/2019   MICROALBUR 1.1 09/21/2019    ASSESSMENT AND PLAN:  Discussed the following assessment and plan:    ICD-10-CM   1. Type 2 diabetes mellitus without complication, without long-term current use of insulin (HCC)  E11.9   2. Medication management  Z79.899   3. Medication side effect  T88.7XXA    Se of med  Much better off of sglt2 meds  Disc others    Will send in rybelsus but may not be covered     Expect PA I fn possible   consider januvia but only modest effects .   Plan ROV in 3-4 mos  After changes to check a1c and fu. Let us know in interim if  Problems  Counseled.   Expectant management and discussion of plan and treatment with opportunity to ask questions and all were answered. The patient agreed with the plan and demonstrated an understanding of the instructions.   Advised to call back or seek an in-person evaluation if worsening  or having  further concerns . Return for 3-4 mos with a1c then .    Shanon Ace, MD

## 2019-10-24 ENCOUNTER — Other Ambulatory Visit: Payer: Self-pay

## 2019-10-24 ENCOUNTER — Encounter: Payer: Self-pay | Admitting: Cardiology

## 2019-10-24 ENCOUNTER — Ambulatory Visit: Payer: 59 | Admitting: Cardiology

## 2019-10-24 VITALS — BP 122/84 | HR 81 | Ht 64.0 in | Wt 141.6 lb

## 2019-10-24 DIAGNOSIS — I2510 Atherosclerotic heart disease of native coronary artery without angina pectoris: Secondary | ICD-10-CM

## 2019-10-24 DIAGNOSIS — E785 Hyperlipidemia, unspecified: Secondary | ICD-10-CM | POA: Diagnosis not present

## 2019-10-24 MED ORDER — ATORVASTATIN CALCIUM 20 MG PO TABS: 20.0000 mg | ORAL_TABLET | Freq: Every day | ORAL | 11 refills | Status: DC

## 2019-10-24 MED ORDER — ASPIRIN EC 81 MG PO TBEC: 81.0000 mg | DELAYED_RELEASE_TABLET | Freq: Every day | ORAL | Status: AC

## 2019-10-24 NOTE — Patient Instructions (Addendum)
Medication Instructions:  Your physician has recommended you make the following change in your medication:  1-INCREASE Atorvastatin 20 mg by mouth daily 2-START Aspirin 81 mg by mouth daily *If you need a refill on your cardiac medications before your next appointment, please call your pharmacy*  Lab Work:  If you have labs (blood work) drawn today and your tests are completely normal, you will receive your results only by: Marland Kitchen MyChart Message (if you have MyChart) OR . A paper copy in the mail If you have any lab test that is abnormal or we need to change your treatment, we will call you to review the results.  Follow-Up: At [facility], you and your health needs are our priority.  As part of our continuing mission to provide you with exceptional heart care, we have created designated Provider Care Teams.  These Care Teams include your primary Cardiologist (physician) and Advanced Practice Providers (APPs -  Physician Assistants and Nurse Practitioners) who all work together to provide you with the care you need, when you need it.  Your next appointment:   12 month(s)  The format for your next appointment:   In Person  Provider:   You may see Ena Dawley, MD or one of the following Advanced Practice Providers on your designated Care Team:    Melina Copa, PA-C  Ermalinda Barrios, PA-C

## 2019-10-24 NOTE — Progress Notes (Signed)
Cardiology Office Note    Date:  10/24/2019   ID:  Jemma, Hudkins June 23, 1956, MRN [id number]  PCP:  Burnis Medin, MD  Cardiologist:   Ena Dawley, MD   Chief complain: Calcification on abdominal CT.  History of Present Illness:  Valerie Gates is a 64 y.o. female prior medical history of type 2 diabetes diagnosed a year ago, untreated hyperlipidemia and hypertension who had an abdominal CT for diagnosis of kidney stone and had an incidental finding of calcification in her abdominal aorta. I have personally reviewed her abdominal CT and there is mild_plaque in her abdominal aorta. No calcium was seen in the coronary arteries, however the most proximal portions are not visualized. The patient remains active she exercises you back once a week and walks almost every day, she has lost 20 pounds since last year when she was diagnosed with diabetes. She denies any chest pain or shortness of breath she has no palpitations, claudications no lower extremity edema dizziness or syncope. In her family her mother was diagnosed with atrial fibrillation, her grandfather had myocardial infarction in his 42s, he was a smoker.  09/04/2017 - the patient is coming after one year, she states that she feels absolutely perfect, she denies any chest pain, shortness of breath, exertional dizziness, falls no claudication palpitation or syncope. She has been compliant with her medications and she is able to tolerate rosuvastatin. She Response to that with on decreasing LDL. She walks daily with no side effects. She had blood work done in April 2018 with HbA1c 6.8%, after changing her diet eating more greens and remaining dietitian it decreased to 6.4%.  09/13/2018 -this is 1 year follow-up, the patient feels and looks great, she walks 3-4 times a week and exercises yoga once a week, she has no chest pain shortness of breath no orthopnea proximal nocturnal dyspnea no lower extremity edema or claudications.  She gets  occasional muscle cramping but otherwise is tolerating Crestor well.  She has been started on Jardiance for hemoglobin A1c of 7.1.  10/24/2019 -this is a 1 year follow-up, the patient is doing well, she continues to walk almost daily with no symptoms of chest pain or shortness of breath.  She has been compliant with her medications, she was prescribed rosuvastatin however her insurance did not cover so she was switched to atorvastatin that she is tolerating well.  She denies any lower extremity edema orthopnea, paroxymal nocturnal dyspnea.  She has been tried on different diabetes medications with side effects such as yeast infection, currently started on Rybelsus with 4 pound weight loss in a short time.  Past Medical History:  Diagnosis Date  . Allergic rhinitis   . Allergy    seasonal  . Anxiety   . Arthritis    lower right back   . Cancer ([place])    skin cancer-scalp  . Diabetes mellitus without complication ([place])   . History of skin cancer   . Hx of adenomatous polyp of colon 03/17/2018  . Hypertension   . Osteopenia     Past Surgical History:  Procedure Laterality Date  . BLADDER SURGERY    . COLONOSCOPY    . skin cancer removed on head      Current Medications: Outpatient Medications Prior to Visit  Medication Sig Dispense Refill  . fexofenadine (ALLEGRA) 180 MG tablet Take 180 mg by mouth daily as needed for allergies or rhinitis.     . fluticasone (FLONASE) 50 MCG/ACT nasal spray  SPRAY 2 SPRAYS INTO EACH NOSTRIL EVERY DAY 16 mL 3  . LORazepam (ATIVAN) 0.5 MG tablet TAKE 1 TABLET (0.5 MG TOTAL) BY MOUTH 2 TIMES DAILY AS NEEDED FOR ANXIETY. CAN TAKE 2 IF NEEDED 30 tablet 0  . naproxen (NAPROSYN) 500 MG tablet Take 500 mg by mouth as needed.    . Semaglutide (RYBELSUS) 7 MG TABS Take 7 mg by mouth daily. For diabetes 30 tablet 3  . valACYclovir (VALTREX) 1000 MG tablet TAKE 1/2 TABLET BY MOUTH TWICE A DAY FOR 3 DAYS AS NEEDED 12 tablet 3  . atorvastatin (LIPITOR) 10 MG tablet  Take 1 tablet (10 mg total) by mouth daily. 90 tablet 1  . olmesartan (BENICAR) 20 MG tablet TAKE 1 TABLET BY MOUTH EVERY DAY MAY DECREASE TO 10MG  TABS PER DAY IF CONTROLLED (Patient not taking: Reported on 10/24/2019) 30 tablet 5  . triamcinolone ointment (KENALOG) 0.5 % Apply to the labia     Facility-Administered Medications Prior to Visit  Medication Dose Route Frequency Provider Last Rate Last Admin  . 0.9 %  sodium chloride infusion  500 mL Intravenous Once Gatha Mayer, MD         Allergies:   Jardiance [empagliflozin], Metformin, and Metformin and related   Social History   Socioeconomic History  . Marital status: Divorced    Spouse name: Not on file  . Number of children: Not on file  . Years of education: Not on file  . Highest education level: Not on file  Occupational History  . Not on file  Tobacco Use  . Smoking status: Former Research scientist (life sciences)  . Smokeless tobacco: Never Used  . Tobacco comment: Quit 35 years ago  Substance and Sexual Activity  . Alcohol use: No  . Drug use: No  . Sexual activity: Not on file  Other Topics Concern  . 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 Difficulty of Paying Living Expenses: Not on file  Food Insecurity:   . Worried About Charity fundraiser in the Last Year: Not on file  . Ran Out of Food in the Last Year: Not on file  Transportation Needs:   . Lack of Transportation (Medical): Not on file  . Lack of Transportation (Non-Medical): Not on file  Physical Activity:   . Days of Exercise per Week: Not on file  . Minutes of Exercise per Session: Not on file  Stress:   . Feeling of Stress : Not on file  Social Connections:   . Frequency of Communication with Friends and Family: Not on file  . Frequency  of Social Gatherings with Friends and Family: Not on file  . Attends Religious Services: Not on file  . Active Member of Clubs or Organizations: Not on file  . Attends Archivist Meetings: Not on file  . Marital Status: Not on file    Family History:  The patient's family history includes Colon polyps in her mother; Diabetes in an other family member; Drug abuse in her daughter; Hypertension in an other family member; Liver disease in her sister; Lung cancer in an other family member; Nephrolithiasis in her daughter; Pancreatic cancer in  her sister; Stroke in an other family member.   ROS:   Please see the history of present illness.    ROS All other systems reviewed and are negative.  PHYSICAL EXAM:   VS:  BP 122/84   Pulse 81   Ht 5\' 4"  (1.626 m)   Wt 141 lb 9.6 oz (64.2 kg)   LMP  (LMP Unknown)   SpO2 98%   BMI 24.31 kg/m    GEN: Well nourished, well developed, in no acute distress  HEENT: normal  Neck: no JVD, carotid bruits, or masses Cardiac: RRR; no murmurs, rubs, or gallops,no edema  Respiratory:  clear to auscultation bilaterally, normal work of breathing GI: soft, nontender, nondistended, + BS MS: no deformity or atrophy  Skin: warm and dry, no rash Neuro:  Alert and Oriented x 3, Strength and sensation are intact Psych: euthymic mood, full affect  Wt Readings from Last 3 Encounters:  10/24/19 141 lb 9.6 oz (64.2 kg)  06/08/19 144 lb 11.2 oz (65.6 kg)  03/21/19 143 lb 3.2 oz (65 kg)    Studies/Labs Reviewed:   EKG:  EKG is ordered today and it shows normal sinus rhythm normal EKG, unchanged from prior. This was personally reviewed.  Recent Labs: 09/21/2019: ALT 15; BUN 20; Creatinine, Ser 0.77; Hemoglobin 14.5; Platelets 195.0; Potassium 4.0; Sodium 143; TSH 1.46   Lipid Panel    Component Value Date/Time   CHOL 160 09/21/2019 0920   CHOL 239 (H) 09/14/2018 0825   TRIG 103.0 09/21/2019 0920   HDL 57.70 09/21/2019 0920   HDL 54 09/14/2018 0825    CHOLHDL 3 09/21/2019 0920   VLDL 20.6 09/21/2019 0920   LDLCALC 82 09/21/2019 0920   LDLCALC 157 (H) 09/14/2018 0825   LDLDIRECT 152.9 06/26/2009 1018    Additional studies/ records that were reviewed today include:    ASSESSMENT:    1. Coronary artery disease involving native coronary artery of native heart without angina pectoris   2. Hyperlipidemia, unspecified hyperlipidemia type     PLAN:  In order of problems listed above:  1. We will continue preventive therapy for coronary artery disease with atorvastatin, her LDL improved dramatically from 157->82, considering she is a diabetic, I will increase atorvastatin to 20 mg daily.  I would also add low-dose aspirin 81 mg daily.   2. Hyperlipidemia  - as above 3. Hypertension -previously on olmesartan, however now controlled without blood pressure medication.  Medication Adjustments/Labs and Tests Ordered: Current medicines are reviewed at length with the patient today.  Concerns regarding medicines are outlined above.  Medication changes, Labs and Tests ordered today are listed in the Patient Instructions below. Patient Instructions  Medication Instructions:  Your physician has recommended you make the following change in your medication:  1-INCREASE Atorvastatin 20 mg by mouth daily 2-START Aspirin 81 mg by mouth daily *If you need a refill on your cardiac medications before your next appointment, please call your pharmacy*  Lab Work:  If you have labs (blood work) drawn today and your tests are completely normal, you will receive your results only by: Marland Kitchen MyChart Message (if you have MyChart) OR . A paper copy in the mail If you have any lab test that is abnormal or we need to change your treatment, we will call you to review the results.  Follow-Up: At [facility], you and your health needs are our priority.  As part of our continuing mission to provide you with exceptional heart care, we have created  designated Provider  Care Teams.  These Care Teams include your primary Cardiologist (physician) and Advanced Practice Providers (APPs -  Physician Assistants and Nurse Practitioners) who all work together to provide you with the care you need, when you need it.  Your next appointment:   12 month(s)  The format for your next appointment:   In Person  Provider:   You may see Ena Dawley, MD or one of the following Advanced Practice Providers on your designated Care Team:    Melina Copa, PA-C  Ermalinda Barrios, PA-C       Signed, Ena Dawley, MD  10/24/2019 1:11 PM    [address], [place], [place]  [address] Phone: [phone]; Fax: [phone]

## 2019-11-24 ENCOUNTER — Telehealth: Payer: Self-pay | Admitting: Internal Medicine

## 2019-11-24 NOTE — Telephone Encounter (Signed)
Pt has an ingrown hair/cyst on her bottom. Pt does not know if she needs to be seen in the office or if she needs to see a dermatologist? Thanks

## 2019-11-25 NOTE — Telephone Encounter (Signed)
Called pt to schedule appt. And she stated she has one on the 21st of April and can wait then. Pt advised if sx worsen to call back. Pt verbalized understanding.

## 2019-12-19 ENCOUNTER — Other Ambulatory Visit: Payer: Self-pay

## 2019-12-19 NOTE — Progress Notes (Signed)
This visit occurred during the SARS-CoV-2 public health emergency.  Safety protocols were in place, including screening questions prior to the visit, additional usage of staff PPE, and extensive cleaning of exam room while observing appropriate contact time as indicated for disinfecting solutions.    Chief Complaint  Patient presents with  . Diabetes    Doing okay  . Cyst    Or possible ingrown hair on buttock area    HPI: Valerie Gates 64 y.o. come in forfu    DM :  Dm  Had intoleracne to previous meds   Has lost weight slight nausea but doing well with rybeslus so far   Needs refill valtrex     Has bump on backside   Small in past and now large  Middle black  Area  No linger red or tender  ( has derm dr Ubaldo Glassing)  Brother passed heart disease in recent past   mom still living in 31s   She has had the covid vaccine  ROS: See pertinent positives and negatives per HPI.  Past Medical History:  Diagnosis Date  . Allergic rhinitis   . Allergy    seasonal  . Anxiety   . Arthritis    lower right back   . Cancer ([place])    skin cancer-scalp  . Diabetes mellitus without complication ([place])   . History of skin cancer   . Hx of adenomatous polyp of colon 03/17/2018  . Hypertension   . Osteopenia     Family History  Problem Relation Age of Onset  . Diabetes Other   . Hypertension Other   . Lung cancer Other   . Stroke Other        1st degree female <50  . Drug abuse Daughter   . Nephrolithiasis Daughter   . Colon polyps Mother   . Liver disease Sister   . Pancreatic cancer Sister   . Colon cancer Neg Hx   . Rectal cancer Neg Hx   . Stomach cancer Neg Hx   . Esophageal cancer Neg Hx     Social History   Socioeconomic History  . Marital status: Divorced    Spouse name: Not on file  . Number of children: Not on file  . Years of education: Not on file  . Highest education level: Not on file  Occupational History  . Not on file  Tobacco Use  . Smoking status: Former  Research scientist (life sciences)  . Smokeless tobacco: Never Used  . Tobacco comment: Quit 35 years ago  Substance and Sexual Activity  . Alcohol use: No  . Drug use: No  . Sexual activity: Not on file  Other Topics Concern  . 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 Difficulty of Paying Living Expenses:   Food Insecurity:   . Worried About Charity fundraiser in the Last Year:   . Arboriculturist in the Last Year:   Transportation Needs:   . Film/video editor (Medical):   Marland Kitchen Lack of Transportation (Non-Medical):   Physical Activity:   . Days of Exercise per Week:   . Minutes of Exercise per Session:   Stress:   .  Feeling of Stress :   Social Connections:   . Frequency of Communication with Friends and Family:   . Frequency of Social Gatherings with Friends and Family:   . Attends Religious Services:   . Active Member of Clubs or Organizations:   . Attends Archivist Meetings:   Marland Kitchen Marital Status:     Outpatient Medications Prior to Visit  Medication Sig Dispense Refill  . aspirin EC 81 MG tablet Take 1 tablet (81 mg total) by mouth daily.    Marland Kitchen atorvastatin (LIPITOR) 20 MG tablet Take 1 tablet (20 mg total) by mouth daily at 6 PM. 30 tablet 11  . fexofenadine (ALLEGRA) 180 MG tablet Take 180 mg by mouth daily as needed for allergies or rhinitis.     . fluticasone (FLONASE) 50 MCG/ACT nasal spray SPRAY 2 SPRAYS INTO EACH NOSTRIL EVERY DAY 16 mL 3  . LORazepam (ATIVAN) 0.5 MG tablet TAKE 1 TABLET (0.5 MG TOTAL) BY MOUTH 2 TIMES DAILY AS NEEDED FOR ANXIETY. CAN TAKE 2 IF NEEDED 30 tablet 0  . naproxen (NAPROSYN) 500 MG tablet Take 500 mg by mouth as needed.    . Semaglutide (RYBELSUS) 7 MG TABS Take 7 mg by mouth daily. For diabetes 30 tablet 3  . valACYclovir  (VALTREX) 1000 MG tablet TAKE 1/2 TABLET BY MOUTH TWICE A DAY FOR 3 DAYS AS NEEDED 12 tablet 3   Facility-Administered Medications Prior to Visit  Medication Dose Route Frequency Provider Last Rate Last Admin  . 0.9 %  sodium chloride infusion  500 mL Intravenous Once Gatha Mayer, MD         EXAM:  BP 130/82   Pulse 77   Temp 97.9 F (36.6 C) (Temporal)   Ht 5\' 4"  (1.626 m)   Wt 135 lb 6.4 oz (61.4 kg)   LMP  (LMP Unknown)   SpO2 98%   BMI 23.24 kg/m   Body mass index is 23.24 kg/m.  GENERAL: vitals reviewed and listed above, alert, oriented, appears well hydrated and in no acute distress HEENT: atraumatic, conjunctiva  clear, no obvious abnormalities on inspection of external nose and ears OP :masked   Skin  Right buttocks with  aa 5 mm comedomal lesion without ob cystic base and non tender    MS: moves all extremities without noticeable focal  abnormality PSYCH: pleasant and cooperative, no obvious depression or anxiety Lab Results  Component Value Date   WBC 5.0 09/21/2019   HGB 14.5 09/21/2019   HCT 43.6 09/21/2019   PLT 195.0 09/21/2019   GLUCOSE 133 (H) 09/21/2019   CHOL 160 09/21/2019   TRIG 103.0 09/21/2019   HDL 57.70 09/21/2019   LDLDIRECT 152.9 06/26/2009   LDLCALC 82 09/21/2019   ALT 15 09/21/2019   AST 15 09/21/2019   NA 143 09/21/2019   K 4.0 09/21/2019   CL 108 09/21/2019   CREATININE 0.77 09/21/2019   BUN 20 09/21/2019   CO2 29 09/21/2019   TSH 1.46 09/21/2019   HGBA1C 5.9 (A) 12/20/2019   MICROALBUR 1.1 09/21/2019   BP Readings from Last 3 Encounters:  12/20/19 130/82  10/24/19 122/84  06/08/19 118/60    ASSESSMENT AND PLAN:  Discussed the following assessment and plan:  Type 2 diabetes mellitus without complication, without long-term current use of insulin (HCC) - a1c down to 5.9 from 7 range - Plan: POCT glycosylated hemoglobin (Hb A1C)  Medication management  Comedomal lesion   Essential hypertension - controlled by lsi and  weight loss Had sig se of sgl2 med   Had diarrhea with metformin  Doing much better on current reg some mild se  \ Skin lesion prob benign comedomal cyst?  Can have derm check  Also  Refill valtrex  Recent out breaks  Can try 1000 bid  As option if needed refill med usually gets 2x per year outbreak but more recently   -Patient advised to return or notify health care team  if  new concerns arise. In interim otherwise  ROV in 6 months or as needed  Update any hcm at that time  Have  Pharmacy contact us for refills  Patient Instructions  gald your  Sugar is better  .  Can try 1000 mg valtrex as a dose may work better .  See dermatologist but seems benign.   Standley Brooking. Alahna Dunne M.D.

## 2019-12-20 ENCOUNTER — Ambulatory Visit (INDEPENDENT_AMBULATORY_CARE_PROVIDER_SITE_OTHER): Payer: 59 | Admitting: Internal Medicine

## 2019-12-20 ENCOUNTER — Other Ambulatory Visit: Payer: Self-pay

## 2019-12-20 ENCOUNTER — Encounter: Payer: Self-pay | Admitting: Internal Medicine

## 2019-12-20 VITALS — BP 130/82 | HR 77 | Temp 97.9°F | Ht 64.0 in | Wt 135.4 lb

## 2019-12-20 DIAGNOSIS — E119 Type 2 diabetes mellitus without complications: Secondary | ICD-10-CM | POA: Diagnosis not present

## 2019-12-20 DIAGNOSIS — I10 Essential (primary) hypertension: Secondary | ICD-10-CM | POA: Diagnosis not present

## 2019-12-20 DIAGNOSIS — Z79899 Other long term (current) drug therapy: Secondary | ICD-10-CM

## 2019-12-20 DIAGNOSIS — L700 Acne vulgaris: Secondary | ICD-10-CM

## 2019-12-20 LAB — POCT GLYCOSYLATED HEMOGLOBIN (HGB A1C): Hemoglobin A1C: 5.9 % — AB (ref 4.0–5.6)

## 2019-12-20 MED ORDER — VALACYCLOVIR HCL 1 G PO TABS: 1000.0000 mg | ORAL_TABLET | Freq: Two times a day (BID) | ORAL | 3 refills | Status: DC

## 2019-12-20 NOTE — Patient Instructions (Signed)
gald your  Sugar is better  .  Can try 1000 mg valtrex as a dose may work better .  See dermatologist but seems benign.

## 2020-01-16 ENCOUNTER — Other Ambulatory Visit: Payer: Self-pay | Admitting: Internal Medicine

## 2020-04-10 ENCOUNTER — Other Ambulatory Visit: Payer: Self-pay | Admitting: Internal Medicine

## 2020-04-10 NOTE — Telephone Encounter (Signed)
Last OV 12/20/2019  Last filled 09/17/2018, # 30 with 0 refills

## 2020-04-23 ENCOUNTER — Other Ambulatory Visit: Payer: Self-pay | Admitting: Internal Medicine

## 2020-05-16 ENCOUNTER — Other Ambulatory Visit: Payer: Self-pay | Admitting: Internal Medicine

## 2020-06-19 NOTE — Progress Notes (Signed)
Chief Complaint  Patient presents with   Follow-up    6 month follow up     HPI: Valerie Gates 64 y.o. come in for Chronic disease management    DM  No isg se of rybelsus x  Not as hungry (pos se)   BP controlled   No eye and neuro sx   Has eye check soon  fam hx glaucoma gm and mom   Only rare use of  Lorazepam  hld atorva   Got covid vaccine   Ask about shingles vaccine  ROS: See pertinent positives and negatives per HPI.  Past Medical History:  Diagnosis Date   Allergic rhinitis    Allergy    seasonal   Anxiety    Arthritis    lower right back    Cancer (HCC)    skin cancer-scalp   Diabetes mellitus without complication ([place])    History of skin cancer    Hx of adenomatous polyp of colon 03/17/2018   Hypertension    Osteopenia     Family History  Problem Relation Age of Onset   Diabetes Other    Hypertension Other    Lung cancer Other    Stroke Other        1st degree female <50   Drug abuse Daughter    Nephrolithiasis Daughter    Colon polyps Mother    Liver disease Sister    Pancreatic cancer Sister    Colon cancer Neg Hx    Rectal cancer Neg Hx    Stomach cancer Neg Hx    Esophageal cancer Neg Hx     Social History   Socioeconomic History   Marital status: Divorced    Spouse name: Not on file   Number of children: Not on file   Years of education: Not on file   Highest education level: Not on file  Occupational History   Not on file  Tobacco Use   Smoking status: Former Smoker   Smokeless tobacco: Never Used   Tobacco comment: Quit 35 years ago  Electronics engineer Use   Vaping Use: Never used  Substance and Sexual Activity   Alcohol use: No   Drug use: No   Sexual activity: Not on file  Other Topics Concern   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  Div   Has bf    Regular exercise- yes   HH of 2   1pet dogs   Daughter SUD hx  IVDU and  incarderation     Social Determinants of Health   Financial Resource Strain:    Difficulty of Paying Living Expenses: Not on file  Food Insecurity:    Worried About Charity fundraiser in the Last Year: Not on file   [employer] of Food in the Last Year: Not on file  Transportation Needs:    Lack of Transportation (Medical): Not on file   Lack of Transportation (Non-Medical): Not on file  Physical Activity:    Days of Exercise per Week: Not on file   Minutes of Exercise per Session: Not on file  Stress:    Feeling of Stress : Not on file  Social Connections:    Frequency of Communication with Friends and Family: Not on file   Frequency of Social Gatherings with Friends and Family: Not on file   Attends Religious Services: Not on file   Active Member of Clubs or  Organizations: Not on file   Attends Archivist Meetings: Not on file   Marital Status: Not on file    Outpatient Medications Prior to Visit  Medication Sig Dispense Refill   aspirin EC 81 MG tablet Take 1 tablet (81 mg total) by mouth daily.     atorvastatin (LIPITOR) 20 MG tablet Take 1 tablet (20 mg total) by mouth daily at 6 PM. 30 tablet 11   fexofenadine (ALLEGRA) 180 MG tablet Take 180 mg by mouth daily as needed for allergies or rhinitis.      fluticasone (FLONASE) 50 MCG/ACT nasal spray SPRAY 2 SPRAYS INTO EACH NOSTRIL EVERY DAY 16 mL 3   LORazepam (ATIVAN) 0.5 MG tablet TAKE 1 TABLET (0.5 MG TOTAL) BY MOUTH 2 TIMES DAILY AS NEEDED FOR ANXIETY. CAN TAKE 2 IF NEEDED 30 tablet 0   naproxen (NAPROSYN) 500 MG tablet Take 500 mg by mouth as needed.     valACYclovir (VALTREX) 1000 MG tablet Take 1 tablet (1,000 mg total) by mouth 2 (two) times daily. X 3 days for outbreaks 20 tablet 3   RYBELSUS 7 MG TABS TAKE 1 TABLET BY MOUTH EVERY DAY FOR DIABETES 30 tablet 3   Facility-Administered Medications Prior to Visit  Medication Dose Route Frequency Provider Last Rate Last Admin   0.9 %  sodium  chloride infusion  500 mL Intravenous Once Gatha Mayer, MD         EXAM:  BP 120/62 (BP Location: Left Arm, Cuff Size: Normal)    Pulse 77    Temp 98.1 F (36.7 C) (Oral)    Resp 16    Ht 5\' 4"  (1.626 m)    Wt 126 lb 3.2 oz (57.2 kg)    LMP  (LMP Unknown)    SpO2 98%    BMI 21.66 kg/m   Body mass index is 21.66 kg/m. Wt Readings from Last 3 Encounters:  06/20/20 126 lb 3.2 oz (57.2 kg)  12/20/19 135 lb 6.4 oz (61.4 kg)  10/24/19 141 lb 9.6 oz (64.2 kg)    GENERAL: vitals reviewed and listed above, alert, oriented, appears well hydrated and in no acute distress HEENT: atraumatic, conjunctiva  clear, no obvious abnormalities on inspection of external nose and ears OP : masked  NECK: no obvious masses on inspection palpation  CV: HRRR, no clubbing cyanosis or  peripheral edema nl cap refill  MS: moves all extremities without noticeable focal  Abnormality feet look nl good cirulation  PSYCH: pleasant and cooperative, no obvious depression or anxiety Lab Results  Component Value Date   WBC 5.0 09/21/2019   HGB 14.5 09/21/2019   HCT 43.6 09/21/2019   PLT 195.0 09/21/2019   GLUCOSE 133 (H) 09/21/2019   CHOL 160 09/21/2019   TRIG 103.0 09/21/2019   HDL 57.70 09/21/2019   LDLDIRECT 152.9 06/26/2009   LDLCALC 82 09/21/2019   ALT 15 09/21/2019   AST 15 09/21/2019   NA 143 09/21/2019   K 4.0 09/21/2019   CL 108 09/21/2019   CREATININE 0.77 09/21/2019   BUN 20 09/21/2019   CO2 29 09/21/2019   TSH 1.46 09/21/2019   HGBA1C 5.6 06/20/2020   MICROALBUR 1.1 09/21/2019   BP Readings from Last 3 Encounters:  06/20/20 120/62  12/20/19 130/82  10/24/19 122/84   Diabetic Foot Exam - Simple   Simple Foot Form Diabetic Foot exam was performed with the following findings: Yes 06/20/2020 11:08 AM  Visual Inspection No deformities, no ulcerations, no other skin  breakdown bilaterally: Yes Sensation Testing Intact to touch and monofilament testing bilaterally: Yes Pulse  Check Posterior Tibialis and Dorsalis pulse intact bilaterally: Yes Comments     ASSESSMENT AND PLAN:  Discussed the following assessment and plan:  Type 2 diabetes mellitus without complication, without long-term current use of insulin (HCC) - Plan: POCT glycosylated hemoglobin (Hb A1C), TSH, Hepatic function panel, Lipid panel, BASIC METABOLIC PANEL WITH GFR, CBC with Differential/Platelet, Hemoglobin A1c, Microalbumin / creatinine urine ratio  Medication monitoring encounter - Plan: TSH, Hepatic function panel, Lipid panel, BASIC METABOLIC PANEL WITH GFR, CBC with Differential/Platelet, Hemoglobin A1c, Microalbumin / creatinine urine ratio  Hyperlipidemia, unspecified hyperlipidemia type - Plan: TSH, Hepatic function panel, Lipid panel, BASIC METABOLIC PANEL WITH GFR, CBC with Differential/Platelet, Hemoglobin A1c, Microalbumin / creatinine urine ratio  Essential hypertension - Plan: TSH, Hepatic function panel, Lipid panel, BASIC METABOLIC PANEL WITH GFR, CBC with Differential/Platelet, Hemoglobin A1c, Microalbumin / creatinine urine ratio  Preventive measure - Plan: TSH, Hepatic function panel, Lipid panel, BASIC METABOLIC PANEL WITH GFR, CBC with Differential/Platelet, Hemoglobin A1c, Microalbumin / creatinine urine ratio  Influenza vaccination declined by patient Doing well  On  Current med had se of metformin  and jardiance  Continue lifestyle intervention healthy eating and activity  Declined flu vaccine but disc shingles vaccine she may get  Future labs to be entered  -Patient advised to return or notify health care team  if  new concerns arise. In interim  Review eval assess counsel etc   30 minutes  Patient Instructions  Glad  you are doing  Well   Continue  Plan cpx in February and labs pre visit .    Standley Brooking. Lurie Mullane M.D.

## 2020-06-20 ENCOUNTER — Ambulatory Visit: Payer: 59 | Admitting: Internal Medicine

## 2020-06-20 ENCOUNTER — Encounter: Payer: Self-pay | Admitting: Internal Medicine

## 2020-06-20 ENCOUNTER — Other Ambulatory Visit: Payer: Self-pay

## 2020-06-20 VITALS — BP 120/62 | HR 77 | Temp 98.1°F | Resp 16 | Ht 64.0 in | Wt 126.2 lb

## 2020-06-20 DIAGNOSIS — Z299 Encounter for prophylactic measures, unspecified: Secondary | ICD-10-CM

## 2020-06-20 DIAGNOSIS — E785 Hyperlipidemia, unspecified: Secondary | ICD-10-CM

## 2020-06-20 DIAGNOSIS — Z5181 Encounter for therapeutic drug level monitoring: Secondary | ICD-10-CM

## 2020-06-20 DIAGNOSIS — I10 Essential (primary) hypertension: Secondary | ICD-10-CM

## 2020-06-20 DIAGNOSIS — Z2821 Immunization not carried out because of patient refusal: Secondary | ICD-10-CM

## 2020-06-20 DIAGNOSIS — E119 Type 2 diabetes mellitus without complications: Secondary | ICD-10-CM | POA: Diagnosis not present

## 2020-06-20 LAB — POCT GLYCOSYLATED HEMOGLOBIN (HGB A1C): Hemoglobin A1C: 5.6 % (ref 4.0–5.6)

## 2020-06-20 MED ORDER — RYBELSUS 7 MG PO TABS: ORAL_TABLET | ORAL | 6 refills | Status: DC

## 2020-06-20 NOTE — Patient Instructions (Signed)
Glad  you are doing  Well   Continue  Plan cpx in February and labs pre visit .

## 2020-07-25 LAB — HM DIABETES EYE EXAM

## 2020-08-13 ENCOUNTER — Other Ambulatory Visit: Payer: Self-pay | Admitting: Obstetrics and Gynecology

## 2020-08-13 DIAGNOSIS — Z1231 Encounter for screening mammogram for malignant neoplasm of breast: Secondary | ICD-10-CM

## 2020-08-14 ENCOUNTER — Ambulatory Visit: Payer: 59

## 2020-08-15 ENCOUNTER — Other Ambulatory Visit: Payer: Self-pay | Admitting: Obstetrics and Gynecology

## 2020-08-15 DIAGNOSIS — M8580 Other specified disorders of bone density and structure, unspecified site: Secondary | ICD-10-CM

## 2020-09-24 ENCOUNTER — Ambulatory Visit: Payer: 59

## 2020-10-08 NOTE — Addendum Note (Signed)
Addended by: Marrion Coy on: 10/08/2020 09:18 AM   Modules accepted: Orders

## 2020-10-15 ENCOUNTER — Other Ambulatory Visit: Payer: 59

## 2020-10-16 ENCOUNTER — Other Ambulatory Visit (INDEPENDENT_AMBULATORY_CARE_PROVIDER_SITE_OTHER): Payer: 59

## 2020-10-16 ENCOUNTER — Other Ambulatory Visit: Payer: Self-pay

## 2020-10-16 DIAGNOSIS — Z299 Encounter for prophylactic measures, unspecified: Secondary | ICD-10-CM

## 2020-10-16 DIAGNOSIS — E119 Type 2 diabetes mellitus without complications: Secondary | ICD-10-CM

## 2020-10-16 DIAGNOSIS — Z5181 Encounter for therapeutic drug level monitoring: Secondary | ICD-10-CM

## 2020-10-16 DIAGNOSIS — I10 Essential (primary) hypertension: Secondary | ICD-10-CM | POA: Diagnosis not present

## 2020-10-16 DIAGNOSIS — E785 Hyperlipidemia, unspecified: Secondary | ICD-10-CM | POA: Diagnosis not present

## 2020-10-16 LAB — CBC WITH DIFFERENTIAL/PLATELET
Basophils Absolute: 0 10*3/uL (ref 0.0–0.1)
Basophils Relative: 0.9 % (ref 0.0–3.0)
Eosinophils Absolute: 0.1 10*3/uL (ref 0.0–0.7)
Eosinophils Relative: 1.3 % (ref 0.0–5.0)
HCT: 40.8 % (ref 36.0–46.0)
Hemoglobin: 13.7 g/dL (ref 12.0–15.0)
Lymphocytes Relative: 29.2 % (ref 12.0–46.0)
Lymphs Abs: 1.2 10*3/uL (ref 0.7–4.0)
MCHC: 33.6 g/dL (ref 30.0–36.0)
MCV: 95 fl (ref 78.0–100.0)
Monocytes Absolute: 0.3 10*3/uL (ref 0.1–1.0)
Monocytes Relative: 7.7 % (ref 3.0–12.0)
Neutro Abs: 2.5 10*3/uL (ref 1.4–7.7)
Neutrophils Relative %: 60.9 % (ref 43.0–77.0)
Platelets: 201 10*3/uL (ref 150.0–400.0)
RBC: 4.3 Mil/uL (ref 3.87–5.11)
RDW: 12.5 % (ref 11.5–15.5)
WBC: 4.1 10*3/uL (ref 4.0–10.5)

## 2020-10-16 LAB — BASIC METABOLIC PANEL
BUN: 17 mg/dL (ref 6–23)
CO2: 30 mEq/L (ref 19–32)
Calcium: 9.2 mg/dL (ref 8.4–10.5)
Chloride: 106 mEq/L (ref 96–112)
Creatinine, Ser: 0.76 mg/dL (ref 0.40–1.20)
GFR: 83.21 mL/min (ref >60.00)
Glucose, Bld: 95 mg/dL (ref 70–99)
Potassium: 3.9 mEq/L (ref 3.5–5.1)
Sodium: 142 mEq/L (ref 135–145)

## 2020-10-16 LAB — LIPID PANEL
Cholesterol: 131 mg/dL (ref 0–200)
HDL: 54.3 mg/dL (ref >39.00)
LDL Cholesterol: 64 mg/dL (ref 0–99)
NonHDL: 76.82
Total CHOL/HDL Ratio: 2
Triglycerides: 62 mg/dL (ref 0.0–149.0)
VLDL: 12.4 mg/dL (ref 0.0–40.0)

## 2020-10-16 LAB — MICROALBUMIN / CREATININE URINE RATIO
Creatinine,U: 152.5 mg/dL
Microalb Creat Ratio: 1.4 mg/g (ref 0.0–30.0)
Microalb, Ur: 2.1 mg/dL — ABNORMAL HIGH (ref 0.0–1.9)

## 2020-10-16 LAB — HEPATIC FUNCTION PANEL
ALT: 20 U/L (ref 0–35)
AST: 16 U/L (ref 0–37)
Albumin: 4.1 g/dL (ref 3.5–5.2)
Alkaline Phosphatase: 77 U/L (ref 39–117)
Bilirubin, Direct: 0.2 mg/dL (ref 0.0–0.3)
Total Bilirubin: 0.8 mg/dL (ref 0.2–1.2)
Total Protein: 6.4 g/dL (ref 6.0–8.3)

## 2020-10-16 LAB — TSH: TSH: 1.62 u[IU]/mL (ref 0.35–4.50)

## 2020-10-16 LAB — HEMOGLOBIN A1C: Hgb A1c MFr Bld: 6.2 % (ref 4.6–6.5)

## 2020-10-20 NOTE — Progress Notes (Signed)
Will review at  upcoming OV   results look pretty good

## 2020-10-21 NOTE — Progress Notes (Signed)
Chief Complaint  Patient presents with  . Annual Exam  . Medication Management  . Diabetes    HPI: Patient  Valerie Gates  65 y.o. comes in today for Preventive Health Care visit  Here with granddaughter ( holiday) HLD taking atorvastatin no problem Lorazepam rare use probably has not used in almost a year but would like to have it if needed refill as needed DM doing well on Rybelsus see past notes feels she is doing best without significant side effect or intolerable ones. No major changes in health did get the Covid vaccine declines the others avoids needles.   Health Maintenance  Topic Date Due  . Hepatitis C Screening  Never done  . PNEUMOCOCCAL POLYSACCHARIDE VACCINE AGE 75-64 HIGH RISK  Never done  . HIV Screening  Never done  . COVID-19 Vaccine (2 - Inadvertent 3-dose series) 12/16/2019  . INFLUENZA VACCINE  11/29/2021 (Originally 04/01/2020)  . MAMMOGRAM  02/17/2021  . HEMOGLOBIN A1C  04/15/2021  . FOOT EXAM  06/20/2021  . OPHTHALMOLOGY EXAM  07/25/2021  . TETANUS/TDAP  10/14/2021  . URINE MICROALBUMIN  10/16/2021  . COLONOSCOPY (Pts 45-40yrs Insurance coverage will need to be confirmed)  03/11/2023  . PAP SMEAR-Modifier  Discontinued   Health Maintenance Review LIFESTYLE:  Exercise:   Tobacco/ETS:n Alcohol: n Sugar beverages:n Sleep:ok Drug use: no HH of 1 cats  Work:y    ROS:  GEN/ HEENT: No fever, significant weight changes sweats headaches vision problems hearing changes, CV/ PULM; No chest pain shortness of breath cough, syncope,edema  change in exercise tolerance. GI /GU: No adominal pain, vomiting, change in bowel habits. No blood in the stool. No significant GU symptoms. SKIN/HEME: ,no acute skin rashes suspicious lesions or bleeding. No lymphadenopathy, nodules, masses.  NEURO/ PSYCH:  No neurologic signs such as weakness numbness. No depression anxiety. IMM/ Allergy: No unusual infections.  Allergy .   REST of 12 system review negative except as  per HPI   Past Medical History:  Diagnosis Date  . Allergic rhinitis   . Allergy    seasonal  . Anxiety   . Arthritis    lower right back   . Cancer ([place])    skin cancer-scalp  . Diabetes mellitus without complication ([place])   . History of skin cancer   . Hx of adenomatous polyp of colon 03/17/2018  . Hypertension   . Osteopenia     Past Surgical History:  Procedure Laterality Date  . BLADDER SURGERY    . COLONOSCOPY    . skin cancer removed on head      Family History  Problem Relation Age of Onset  . Diabetes Other   . Hypertension Other   . Lung cancer Other   . Stroke Other        1st degree female <50  . Drug abuse Daughter   . Nephrolithiasis Daughter   . Colon polyps Mother   . Liver disease Sister   . Pancreatic cancer Sister   . Colon cancer Neg Hx   . Rectal cancer Neg Hx   . Stomach cancer Neg Hx   . Esophageal cancer Neg Hx     Social History   Socioeconomic History  . Marital status: Divorced    Spouse name: Not on file  . Number of children: Not on file  . Years of education: Not on file  . Highest education level: Not on file  Occupational History  . Not on file  Tobacco Use  .  Smoking status: Former Research scientist (life sciences)  . Smokeless tobacco: Never Used  . Tobacco comment: Quit 35 years ago  Vaping Use  . Vaping Use: Never used  Substance and Sexual Activity  . Alcohol use: No  . Drug use: No  . Sexual activity: Not on file  Other Topics Concern  . 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 aspirin EC 81 MG tablet Take 1 tablet (81 mg total) by mouth daily.    Marland Kitchen atorvastatin (LIPITOR) 20 MG tablet Take 1 tablet (20 mg total) by mouth daily at 6 PM. 30 tablet 11  . fexofenadine (ALLEGRA) 180 MG tablet Take 180 mg by mouth daily as needed for allergies or rhinitis.     . fluticasone (FLONASE) 50 MCG/ACT nasal spray SPRAY 2 SPRAYS INTO EACH NOSTRIL EVERY DAY 16 mL 3  . LORazepam (ATIVAN) 0.5 MG tablet TAKE 1 TABLET (0.5 MG TOTAL) BY MOUTH 2 TIMES DAILY AS NEEDED FOR ANXIETY. CAN TAKE 2 IF NEEDED 30 tablet 0  . naproxen (NAPROSYN) 500 MG tablet Take 500 mg by mouth as needed.    . Semaglutide (RYBELSUS) 7 MG TABS TAKE 1 TABLET BY MOUTH EVERY DAY FOR DIABETES 30 tablet 6  . valACYclovir (VALTREX) 1000 MG tablet Take 1 tablet (1,000 mg total) by mouth 2 (two) times daily. X 3 days for outbreaks 20 tablet 3   Facility-Administered Medications Prior to Visit  Medication Dose Route Frequency Provider Last Rate Last Admin  . 0.9 %  sodium chloride infusion  500 mL Intravenous Once Gatha Mayer, MD         EXAM:  BP 126/78 (BP Location: Left Arm, Patient Position: Sitting, Cuff Size: Normal)   Pulse 83   Temp 98.1 F (36.7 C) (Oral)   Ht 5\' 4"  (1.626 m)   Wt 127 lb 6.4 oz (57.8 kg)   LMP  (LMP Unknown)   SpO2 98%   BMI 21.87 kg/m   Body mass index is 21.87 kg/m. Wt Readings from Last 3 Encounters:  10/22/20 127 lb 6.4 oz (57.8 kg)  06/20/20 126 lb 3.2 oz (57.2 kg)  12/20/19 135 lb 6.4 oz (61.4 kg)    Physical Exam: Vital signs reviewed WJX:BJYN is a well-developed well-nourished alert cooperative    who appearsr stated age in no acute distress.  HEENT: normocephalic atraumatic , Eyes: PERRL EOM's full, conjunctiva clear, Nares: paten,t no deformity discharge or tenderness., Ears: no deformity EAC's clear TMs with normal landmarks. Mouth: masked NECK: supple without masses, thyromegaly or bruits. CHEST/PULM:  Clear to auscultation and percussion breath sounds  equal no wheeze , rales or rhonchi. No chest wall deformities or tenderness. Breast: normal by inspection . No dimpling, discharge, masses, tenderness or discharge . CV: PMI is nondisplaced, S1 S2 no gallops, murmurs, rubs. Peripheral pulses are full without delay.No JVD .  ABDOMEN: Bowel sounds normal nontender  No guard or rebound,  no hepato splenomegal no CVA tenderness.   Extremtities:  No clubbing cyanosis or edema, no acute joint swelling or redness no focal atrophy feet normal  No ulcers or callous  NEURO:  Oriented x3, cranial nerves 3-12 appear to be intact, no obvious focal weakness,gait within normal limits no abnormal reflexes or asymmetrical SKIN: No acute rashes normal turgor, color, no bruising or petechiae. PSYCH: Oriented, good eye contact, no obvious depression anxiety, cognition and judgment appear normal. LN: no cervical axillary inguinal adenopathy  Lab Results  Component Value Date   WBC 4.1 10/16/2020   HGB 13.7 10/16/2020   HCT 40.8 10/16/2020   PLT 201.0 10/16/2020   GLUCOSE 95 10/16/2020   CHOL 131 10/16/2020   TRIG 62.0 10/16/2020   HDL 54.30 10/16/2020   LDLDIRECT 152.9 06/26/2009   LDLCALC 64 10/16/2020   ALT 20 10/16/2020   AST 16 10/16/2020   NA 142 10/16/2020   K 3.9 10/16/2020   CL 106 10/16/2020   CREATININE 0.76 10/16/2020   BUN 17 10/16/2020   CO2 30 10/16/2020   TSH 1.62 10/16/2020   HGBA1C 6.2 10/16/2020   MICROALBUR 2.1 (H) 10/16/2020    BP Readings from Last 3 Encounters:  10/22/20 126/78  06/20/20 120/62  12/20/19 130/82    Lab results reviewed with patient   ASSESSMENT AND PLAN:  Discussed the following assessment and plan:    ICD-10-CM   1. Visit for preventive health examination  Z00.00   2. Medication monitoring encounter  Z51.81   3. Hyperlipidemia, unspecified hyperlipidemia type  E78.5   4. Type 2 diabetes mellitus without complication, without long-term current use of insulin (HCC)  E11.9   5. Essential hypertension   I10   6. Pneumococcal vaccination declined by patient  Z28.21   declines  Pneumococcal o there  Doing well with  meds today   Return in about 6 months (around 04/21/2021) for hg a1c at visit  or as needed .  Patient Care Team: Britania Shreeve, Standley Brooking, MD as PCP - General Dorothy Spark, MD as PCP - Cardiology (Cardiology) Lomax, Marny Lowenstein, MD (Inactive) (Obstetrics and Gynecology) Servando Salina, MD as Consulting Physician (Obstetrics and Gynecology) Rolm Bookbinder, MD as Consulting Physician (Dermatology) Patient Instructions   Glad you are doing well  Same meds  Continue lifestyle intervention healthy eating and exercise .   Plan ROV in 6 mos and check a1c at that time    Health Maintenance, Female Adopting a healthy lifestyle and getting preventive care are important in promoting health and wellness. Ask your health care provider about:  The right schedule for you to have regular tests and exams.  Things you can do on your own to prevent diseases and keep yourself healthy. What should I know about diet, weight, and exercise? Eat a healthy diet  Eat a diet that includes plenty of vegetables, fruits, low-fat dairy products, and lean protein.  Do not eat a lot of foods that are high in solid fats, added sugars, or sodium.   Maintain a healthy weight Body mass index (BMI) is used to identify weight problems. It estimates body fat based on height and weight. Your health care provider can help determine your BMI and help you achieve or maintain a healthy weight. Get regular exercise Get regular exercise. This is one of the most important things you can do for your health. Most adults should:  Exercise for at least 150 minutes each week. The exercise should increase your heart rate and  make you sweat (moderate-intensity exercise).  Do strengthening exercises at least twice a week. This is in addition to the moderate-intensity exercise.  Spend less time sitting. Even light  physical activity can be beneficial. Watch cholesterol and blood lipids Have your blood tested for lipids and cholesterol at 64 years of age, then have this test every 5 years. Have your cholesterol levels checked more often if:  Your lipid or cholesterol levels are high.  You are older than 64 years of age.  You are at high risk for heart disease. What should I know about cancer screening? Depending on your health history and family history, you may need to have cancer screening at various ages. This may include screening for:  Breast cancer.  Cervical cancer.  Colorectal cancer.  Skin cancer.  Lung cancer. What should I know about heart disease, diabetes, and high blood pressure? Blood pressure and heart disease  High blood pressure causes heart disease and increases the risk of stroke. This is more likely to develop in people who have high blood pressure readings, are of African descent, or are overweight.  Have your blood pressure checked: ? Every 3-5 years if you are 38-26 years of age. ? Every year if you are 59 years old or older. Diabetes Have regular diabetes screenings. This checks your fasting blood sugar level. Have the screening done:  Once every three years after age 70 if you are at a normal weight and have a low risk for diabetes.  More often and at a younger age if you are overweight or have a high risk for diabetes. What should I know about preventing infection? Hepatitis B If you have a higher risk for hepatitis B, you should be screened for this virus. Talk with your health care provider to find out if you are at risk for hepatitis B infection. Hepatitis C Testing is recommended for:  Everyone born from [birth year] through 1964.  Anyone with known risk factors for hepatitis C. Sexually transmitted infections (STIs)  Get screened for STIs, including gonorrhea and chlamydia, if: ? You are sexually active and are younger than 64 years of age. ? You are older  than 64 years of age and your health care provider tells you that you are at risk for this type of infection. ? Your sexual activity has changed since you were last screened, and you are at increased risk for chlamydia or gonorrhea. Ask your health care provider if you are at risk.  Ask your health care provider about whether you are at high risk for HIV. Your health care provider may recommend a prescription medicine to help prevent HIV infection. If you choose to take medicine to prevent HIV, you should first get tested for HIV. You should then be tested every 3 months for as long as you are taking the medicine. Pregnancy  If you are about to stop having your period (premenopausal) and you may become pregnant, seek counseling before you get pregnant.  Take 400 to 800 micrograms (mcg) of folic acid every day if you become pregnant.  Ask for birth control (contraception) if you want to prevent pregnancy. Osteoporosis and menopause Osteoporosis is a disease in which the bones lose minerals and strength with aging. This can result in bone fractures. If you are 67 years old or older, or if you are at risk for osteoporosis and fractures, ask your health care provider if you should:  Be screened for bone loss.  Take a calcium or vitamin D  supplement to lower your risk of fractures.  Be given hormone replacement therapy (HRT) to treat symptoms of menopause. Follow these instructions at home: Lifestyle  Do not use any products that contain nicotine or tobacco, such as cigarettes, e-cigarettes, and chewing tobacco. If you need help quitting, ask your health care provider.  Do not use street drugs.  Do not share needles.  Ask your health care provider for help if you need support or information about quitting drugs. Alcohol use  Do not drink alcohol if: ? Your health care provider tells you not to drink. ? You are pregnant, may be pregnant, or are planning to become pregnant.  If you drink  alcohol: ? Limit how much you use to 0-1 drink a day. ? Limit intake if you are breastfeeding.  Be aware of how much alcohol is in your drink. In the [place] one drink equals one 12 oz bottle of beer (355 mL), one 5 oz glass of wine (148 mL), or one 1 oz glass of hard liquor (44 mL). General instructions  Schedule regular health, dental, and eye exams.  Stay current with your vaccines.  Tell your health care provider if: ? You often feel depressed. ? You have ever been abused or do not feel safe at home. Summary  Adopting a healthy lifestyle and getting preventive care are important in promoting health and wellness.  Follow your health care provider's instructions about healthy diet, exercising, and getting tested or screened for diseases.  Follow your health care provider's instructions on monitoring your cholesterol and blood pressure. This information is not intended to replace advice given to you by your health care provider. Make sure you discuss any questions you have with your health care provider. Document Revised: 08/11/2018 Document Reviewed: 08/11/2018 Elsevier Patient Education  2021 Manito K. Eligha Kmetz M.D.

## 2020-10-22 ENCOUNTER — Ambulatory Visit (INDEPENDENT_AMBULATORY_CARE_PROVIDER_SITE_OTHER): Payer: 59 | Admitting: Internal Medicine

## 2020-10-22 ENCOUNTER — Encounter: Payer: Self-pay | Admitting: Internal Medicine

## 2020-10-22 ENCOUNTER — Other Ambulatory Visit: Payer: Self-pay

## 2020-10-22 VITALS — BP 126/78 | HR 83 | Temp 98.1°F | Ht 64.0 in | Wt 127.4 lb

## 2020-10-22 DIAGNOSIS — E119 Type 2 diabetes mellitus without complications: Secondary | ICD-10-CM | POA: Diagnosis not present

## 2020-10-22 DIAGNOSIS — Z0000 Encounter for general adult medical examination without abnormal findings: Secondary | ICD-10-CM

## 2020-10-22 DIAGNOSIS — Z5181 Encounter for therapeutic drug level monitoring: Secondary | ICD-10-CM

## 2020-10-22 DIAGNOSIS — Z2821 Immunization not carried out because of patient refusal: Secondary | ICD-10-CM | POA: Diagnosis not present

## 2020-10-22 DIAGNOSIS — E785 Hyperlipidemia, unspecified: Secondary | ICD-10-CM

## 2020-10-22 DIAGNOSIS — I10 Essential (primary) hypertension: Secondary | ICD-10-CM | POA: Diagnosis not present

## 2020-10-22 NOTE — Patient Instructions (Addendum)
Glad you are doing well  Same meds  Continue lifestyle intervention healthy eating and exercise .   Plan ROV in 6 mos and check a1c at that time    Health Maintenance, Female Adopting a healthy lifestyle and getting preventive care are important in promoting health and wellness. Ask your health care provider about:  The right schedule for you to have regular tests and exams.  Things you can do on your own to prevent diseases and keep yourself healthy. What should I know about diet, weight, and exercise? Eat a healthy diet  Eat a diet that includes plenty of vegetables, fruits, low-fat dairy products, and lean protein.  Do not eat a lot of foods that are high in solid fats, added sugars, or sodium.   Maintain a healthy weight Body mass index (BMI) is used to identify weight problems. It estimates body fat based on height and weight. Your health care provider can help determine your BMI and help you achieve or maintain a healthy weight. Get regular exercise Get regular exercise. This is one of the most important things you can do for your health. Most adults should:  Exercise for at least 150 minutes each week. The exercise should increase your heart rate and make you sweat (moderate-intensity exercise).  Do strengthening exercises at least twice a week. This is in addition to the moderate-intensity exercise.  Spend less time sitting. Even light physical activity can be beneficial. Watch cholesterol and blood lipids Have your blood tested for lipids and cholesterol at 64 years of age, then have this test every 5 years. Have your cholesterol levels checked more often if:  Your lipid or cholesterol levels are high.  You are older than 64 years of age.  You are at high risk for heart disease. What should I know about cancer screening? Depending on your health history and family history, you may need to have cancer screening at various ages. This may include screening for:  Breast  cancer.  Cervical cancer.  Colorectal cancer.  Skin cancer.  Lung cancer. What should I know about heart disease, diabetes, and high blood pressure? Blood pressure and heart disease  High blood pressure causes heart disease and increases the risk of stroke. This is more likely to develop in people who have high blood pressure readings, are of African descent, or are overweight.  Have your blood pressure checked: ? Every 3-5 years if you are 62-86 years of age. ? Every year if you are 41 years old or older. Diabetes Have regular diabetes screenings. This checks your fasting blood sugar level. Have the screening done:  Once every three years after age 78 if you are at a normal weight and have a low risk for diabetes.  More often and at a younger age if you are overweight or have a high risk for diabetes. What should I know about preventing infection? Hepatitis B If you have a higher risk for hepatitis B, you should be screened for this virus. Talk with your health care provider to find out if you are at risk for hepatitis B infection. Hepatitis C Testing is recommended for:  Everyone born from [birth year] through 1964.  Anyone with known risk factors for hepatitis C. Sexually transmitted infections (STIs)  Get screened for STIs, including gonorrhea and chlamydia, if: ? You are sexually active and are younger than 64 years of age. ? You are older than 64 years of age and your health care provider tells you that you are  at risk for this type of infection. ? Your sexual activity has changed since you were last screened, and you are at increased risk for chlamydia or gonorrhea. Ask your health care provider if you are at risk.  Ask your health care provider about whether you are at high risk for HIV. Your health care provider may recommend a prescription medicine to help prevent HIV infection. If you choose to take medicine to prevent HIV, you should first get tested for HIV. You should  then be tested every 3 months for as long as you are taking the medicine. Pregnancy  If you are about to stop having your period (premenopausal) and you may become pregnant, seek counseling before you get pregnant.  Take 400 to 800 micrograms (mcg) of folic acid every day if you become pregnant.  Ask for birth control (contraception) if you want to prevent pregnancy. Osteoporosis and menopause Osteoporosis is a disease in which the bones lose minerals and strength with aging. This can result in bone fractures. If you are 84 years old or older, or if you are at risk for osteoporosis and fractures, ask your health care provider if you should:  Be screened for bone loss.  Take a calcium or vitamin D supplement to lower your risk of fractures.  Be given hormone replacement therapy (HRT) to treat symptoms of menopause. Follow these instructions at home: Lifestyle  Do not use any products that contain nicotine or tobacco, such as cigarettes, e-cigarettes, and chewing tobacco. If you need help quitting, ask your health care provider.  Do not use street drugs.  Do not share needles.  Ask your health care provider for help if you need support or information about quitting drugs. Alcohol use  Do not drink alcohol if: ? Your health care provider tells you not to drink. ? You are pregnant, may be pregnant, or are planning to become pregnant.  If you drink alcohol: ? Limit how much you use to 0-1 drink a day. ? Limit intake if you are breastfeeding.  Be aware of how much alcohol is in your drink. In the [place] one drink equals one 12 oz bottle of beer (355 mL), one 5 oz glass of wine (148 mL), or one 1 oz glass of hard liquor (44 mL). General instructions  Schedule regular health, dental, and eye exams.  Stay current with your vaccines.  Tell your health care provider if: ? You often feel depressed. ? You have ever been abused or do not feel safe at home. Summary  Adopting a  healthy lifestyle and getting preventive care are important in promoting health and wellness.  Follow your health care provider's instructions about healthy diet, exercising, and getting tested or screened for diseases.  Follow your health care provider's instructions on monitoring your cholesterol and blood pressure. This information is not intended to replace advice given to you by your health care provider. Make sure you discuss any questions you have with your health care provider. Document Revised: 08/11/2018 Document Reviewed: 08/11/2018 Elsevier Patient Education  2021 [employer].

## 2020-11-01 ENCOUNTER — Other Ambulatory Visit: Payer: Self-pay | Admitting: Cardiology

## 2020-11-01 DIAGNOSIS — E785 Hyperlipidemia, unspecified: Secondary | ICD-10-CM

## 2020-11-05 ENCOUNTER — Other Ambulatory Visit: Payer: Self-pay

## 2020-11-05 ENCOUNTER — Ambulatory Visit
Admission: RE | Admit: 2020-11-05 | Discharge: 2020-11-05 | Disposition: A | Discharge status: Home / Self Care | Payer: 59 | Source: Ambulatory Visit | Attending: Obstetrics and Gynecology | Admitting: Obstetrics and Gynecology

## 2020-11-05 DIAGNOSIS — Z1231 Encounter for screening mammogram for malignant neoplasm of breast: Secondary | ICD-10-CM

## 2020-11-17 NOTE — Progress Notes (Signed)
Cardiology Office Note:    Date:  11/19/2020   ID:  Valerie Gates, DOB 08-23-1956, MRN [id number]  PCP:  Burnis Medin, [place]  Cardiologist:  Ena Dawley, MD  Advanced Practice Provider:  No care team member to display Electrophysiologist:  None   Referring MD: Burnis Medin, MD    History of Present Illness:    Valerie Gates is a 65 y.o. female with a hx of type 2 diabetes, HLD and hypertension who previously followed with Dr. Meda Coffee who now presents to clinic for follow-up.  Initially seen by Dr. Meda Coffee after abdominal CT for diagnosis of kidney stone and had an incidental finding of calcification in her abdominal aorta. CT reviewed and there is mild plaque in her abdominal aorta. No calcium was seen in the coronary arteries, however the most proximal portions are not visualized.  Last seen by Dr. Meda Coffee on 10/24/19 where she was doing well. Was active and compliant with her lipitor.  Today, the patient states that she is doing overall well. Has lost 15lbs since changing her diabetes medications. No chest pain, SOB, LE edema, nausea or vomiting. No exertional symptoms. Has been taking all medications as prescribed. Has been off blood pressure medications after her weight loss.  Past Medical History:  Diagnosis Date  . Allergic rhinitis   . Allergy    seasonal  . Anxiety   . Arthritis    lower right back   . Cancer ([place])    skin cancer-scalp  . Diabetes mellitus without complication ([place])   . History of skin cancer   . Hx of adenomatous polyp of colon 03/17/2018  . Hypertension   . Osteopenia     Past Surgical History:  Procedure Laterality Date  . BLADDER SURGERY    . COLONOSCOPY    . skin cancer removed on head      Current Medications: Current Meds  Medication Sig  . aspirin EC 81 MG tablet Take 1 tablet (81 mg total) by mouth daily.  . fexofenadine (ALLEGRA) 180 MG tablet Take 180 mg by mouth daily as needed for  allergies or rhinitis.   . fluticasone (FLONASE) 50 MCG/ACT nasal spray SPRAY 2 SPRAYS INTO EACH NOSTRIL EVERY DAY  . LORazepam (ATIVAN) 0.5 MG tablet TAKE 1 TABLET (0.5 MG TOTAL) BY MOUTH 2 TIMES DAILY AS NEEDED FOR ANXIETY. CAN TAKE 2 IF NEEDED  . naproxen (NAPROSYN) 500 MG tablet Take 500 mg by mouth as needed.  . Semaglutide (RYBELSUS) 7 MG TABS TAKE 1 TABLET BY MOUTH EVERY DAY FOR DIABETES  . valACYclovir (VALTREX) 1000 MG tablet Take 1 tablet (1,000 mg total) by mouth 2 (two) times daily. X 3 days for outbreaks  . [DISCONTINUED] atorvastatin (LIPITOR) 20 MG tablet TAKE 1 TABLET (20 MG TOTAL) BY MOUTH DAILY AT 6 PM.   Current Facility-Administered Medications for the 11/19/20 encounter (Office Visit) with Freada Bergeron, MD  Medication  . 0.9 %  sodium chloride infusion     Allergies:   Jardiance [empagliflozin], Metformin, and Metformin and related   Social History   Socioeconomic History  . Marital status: Divorced    Spouse name: Not on file  . Number of children: Not on file  . Years of education: Not on file  . Highest education level: Not on file  Occupational History  . Not on file  Tobacco Use  . Smoking status: Former Research scientist (life sciences)  . Smokeless tobacco: Never Used  .  Tobacco comment: Quit 35 years ago  Vaping Use  . Vaping Use: Never used  Substance and Sexual Activity  . Alcohol use: No  . Drug use: No  . Sexual activity: Not on file  Other Topics Concern  . 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  Div   Has bf    Regular exercise- yes   HH of 2   1pet dogs   Daughter SUD hx  IVDU and incarderation     Social Determinants of Health   Financial Resource Strain: Not on file  Food Insecurity: Not on file  Transportation Needs: Not on file  Physical Activity: Not on file  Stress: Not on file  Social Connections: Not on file     Family History: The patient's family history includes Colon polyps  in her mother; Diabetes in an other family member; Drug abuse in her daughter; Hypertension in an other family member; Liver disease in her sister; Lung cancer in an other family member; Nephrolithiasis in her daughter; Pancreatic cancer in her sister; Stroke in an other family member. There is no history of Colon cancer, Rectal cancer, Stomach cancer, or Esophageal cancer.  ROS:   Please see the history of present illness.    Review of Systems  Constitutional: Negative for chills and fever.  HENT: Negative for hearing loss.   Eyes: Negative for blurred vision and redness.  Respiratory: Negative for shortness of breath.   Cardiovascular: Negative for chest pain, palpitations, orthopnea, claudication, leg swelling and PND.  Gastrointestinal: Negative for heartburn, melena, nausea and vomiting.  Genitourinary: Negative for dysuria.  Musculoskeletal: Negative for falls and myalgias.  Neurological: Negative for dizziness and loss of consciousness.  Endo/Heme/Allergies: Negative for polydipsia.  Psychiatric/Behavioral: Negative for substance abuse.    EKGs/Labs/Other Studies Reviewed:    The following studies were reviewed today:  FINDINGS: Lower chest: No acute abnormality.  Hepatobiliary: No focal liver abnormality is seen. No gallstones, gallbladder wall thickening, or biliary dilatation.  Pancreas: Unremarkable. No pancreatic ductal dilatation or surrounding inflammatory changes.  Spleen: Normal in size without focal abnormality.  Adrenals/Urinary Tract: Normal adrenal glands. 3 mm nonobstructing left renal calculus. Bilateral parapelvic cysts. No ureteral calculus. No obstructive uropathy.  Stomach/Bowel: No bowel wall thickening or bowel dilatation. No pneumatosis, pneumoperitoneum portal venous gas. Small hiatal hernia.  Vascular/Lymphatic: Aortic atherosclerosis. No enlarged abdominal or pelvic lymph nodes.  Reproductive: Uterus and bilateral adnexa are  unremarkable.  Other: No fluid collection or hematoma.  Musculoskeletal: Levoscoliosis of the thoracolumbar spine. No acute osseous abnormality. No aggressive lytic or sclerotic osseous lesion.  IMPRESSION: 1. 3 mm nonobstructing left renal calculus. No ureteral calculus. No obstructive uropathy. 2. Bilateral parapelvic cysts.   EKG:  EKG is  ordered today.  The ekg ordered today demonstrates NSR with PAC and PVC  Recent Labs: 10/16/2020: ALT 20; BUN 17; Creatinine, Ser 0.76; Hemoglobin 13.7; Platelets 201.0; Potassium 3.9; Sodium 142; TSH 1.62  Recent Lipid Panel    Component Value Date/Time   CHOL 131 10/16/2020 0916   CHOL 239 (H) 09/14/2018 0825   TRIG 62.0 10/16/2020 0916   HDL 54.30 10/16/2020 0916   HDL 54 09/14/2018 0825   CHOLHDL 2 10/16/2020 0916   VLDL 12.4 10/16/2020 0916   LDLCALC 64 10/16/2020 0916   LDLCALC 157 (H) 09/14/2018 0825   LDLDIRECT 152.9 06/26/2009 1018     Physical Exam:    VS:  BP 128/78  Pulse 79   Ht 5' 4.75" (1.645 m)   Wt 125 lb 9.6 oz (57 kg)   LMP  (LMP Unknown)   BMI 21.06 kg/m     Wt Readings from Last 3 Encounters:  11/19/20 125 lb 9.6 oz (57 kg)  10/22/20 127 lb 6.4 oz (57.8 kg)  06/20/20 126 lb 3.2 oz (57.2 kg)     GEN:  Well nourished, well developed in no acute distress HEENT: Normal NECK: No JVD; No carotid bruits CARDIAC: RRR, no murmurs, rubs, gallops RESPIRATORY:  Clear to auscultation without rales, wheezing or rhonchi  ABDOMEN: Soft, non-tender, non-distended MUSCULOSKELETAL:  No edema; No deformity  SKIN: Warm and dry NEUROLOGIC:  Alert and oriented x 3 PSYCHIATRIC:  Normal affect   ASSESSMENT:    1. Hyperlipidemia, unspecified hyperlipidemia type   2. Essential hypertension   3. Type 2 diabetes mellitus without complication, without long-term current use of insulin (HCC)    PLAN:    In order of problems listed above:  # Calcification in the Abdominal Aorta: # HLD: LDL well controlled at 65 on  labs 10/16/20.  -Continue ASA 81mg  daily -Continue lipitor 420mg  daily -Declined coronary calcium score today; may consider in the future if covered by insurance  #Hypertension : Previously on olmesartan, however now controlled without blood pressure medication after weight loss. -Continue to monitor -Continue diet and lifestyle modifications  #DMII:  HgA1C 6.2. Managed by PCP -Continue semglutide  Exercise recommendations: Goal of exercising for at least 30 minutes a day, at least 5 times per week.  Please exercise to a moderate exertion.  This means that while exercising it is difficult to speak in full sentences, however you are not so short of breath that you feel you must stop, and not so comfortable that you can carry on a full conversation.  Exertion level should be approximately a 5/10, if 10 is the most exertion you can perform.  Diet recommendations: Recommend a heart healthy diet such as the Mediterranean diet.  This diet consists of plant based foods, healthy fats, lean meats, olive oil.  It suggests limiting the intake of simple carbohydrates such as white breads, pastries, and pastas.  It also limits the amount of red meat, wine, and dairy products such as cheese that one should consume on a daily basis.    Medication Adjustments/Labs and Tests Ordered: Current medicines are reviewed at length with the patient today.  Concerns regarding medicines are outlined above.  Orders Placed This Encounter  Procedures  . EKG 12-Lead   Meds ordered this encounter  Medications  . atorvastatin (LIPITOR) 20 MG tablet    Sig: Take 1 tablet (20 mg total) by mouth daily at 6 PM.    Dispense:  30 tablet    Refill:  11    Patient Instructions  Medication Instructions:  NO CHANGES If you need a refill on your cardiac medications before your next appointment, please call your pharmacy*   Lab Work: NONE If you have labs (blood work) drawn today and your tests are completely normal,  you will receive your results only by: Marland Kitchen MyChart Message (if you have MyChart) OR . A paper copy in the mail If you have any lab test that is abnormal or we need to change your treatment, we will call you to review the results.   Testing/Procedures: NONE   Follow-Up: At [facility], you and your health needs are our priority.  As part of our continuing mission to provide you  with exceptional heart care, we have created designated Provider Care Teams.  These Care Teams include your primary Cardiologist (physician) and Advanced Practice Providers (APPs -  Physician Assistants and Nurse Practitioners) who all work together to provide you with the care you need, when you need it.  We recommend signing up for the patient portal called "MyChart".  Sign up information is provided on this After Visit Summary.  MyChart is used to connect with patients for Virtual Visits (Telemedicine).  Patients are able to view lab/test results, encounter notes, upcoming appointments, etc.  Non-urgent messages can be sent to your provider as well.   To learn more about what you can do with MyChart, go to [email or web address].    Your next appointment:   1 year(s)  The format for your next appointment:   In Person  Provider:   Gwyndolyn Kaufman, MD   Other Instructions NONE    Signed, Freada Bergeron, MD  11/19/2020 5:15 PM    [place]

## 2020-11-19 ENCOUNTER — Other Ambulatory Visit: Payer: Self-pay

## 2020-11-19 ENCOUNTER — Encounter: Payer: Self-pay | Admitting: Cardiology

## 2020-11-19 ENCOUNTER — Ambulatory Visit: Payer: 59 | Admitting: Cardiology

## 2020-11-19 VITALS — BP 128/78 | HR 79 | Ht 64.75 in | Wt 125.6 lb

## 2020-11-19 DIAGNOSIS — E785 Hyperlipidemia, unspecified: Secondary | ICD-10-CM | POA: Diagnosis not present

## 2020-11-19 DIAGNOSIS — E119 Type 2 diabetes mellitus without complications: Secondary | ICD-10-CM

## 2020-11-19 DIAGNOSIS — I10 Essential (primary) hypertension: Secondary | ICD-10-CM | POA: Diagnosis not present

## 2020-11-19 MED ORDER — ATORVASTATIN CALCIUM 20 MG PO TABS: 20.0000 mg | ORAL_TABLET | Freq: Every day | ORAL | 11 refills | Status: DC

## 2020-11-19 NOTE — Patient Instructions (Signed)
Medication Instructions:  NO CHANGES If you need a refill on your cardiac medications before your next appointment, please call your pharmacy*   Lab Work: NONE If you have labs (blood work) drawn today and your tests are completely normal, you will receive your results only by: Marland Kitchen MyChart Message (if you have MyChart) OR . A paper copy in the mail If you have any lab test that is abnormal or we need to change your treatment, we will call you to review the results.   Testing/Procedures: NONE   Follow-Up: At [facility], you and your health needs are our priority.  As part of our continuing mission to provide you with exceptional heart care, we have created designated Provider Care Teams.  These Care Teams include your primary Cardiologist (physician) and Advanced Practice Providers (APPs -  Physician Assistants and Nurse Practitioners) who all work together to provide you with the care you need, when you need it.  We recommend signing up for the patient portal called "MyChart".  Sign up information is provided on this After Visit Summary.  MyChart is used to connect with patients for Virtual Visits (Telemedicine).  Patients are able to view lab/test results, encounter notes, upcoming appointments, etc.  Non-urgent messages can be sent to your provider as well.   To learn more about what you can do with MyChart, go to [email or web address].    Your next appointment:   1 year(s)  The format for your next appointment:   In Person  Provider:   Gwyndolyn Kaufman, MD   Other Instructions NONE

## 2020-11-20 ENCOUNTER — Other Ambulatory Visit: Payer: 59

## 2020-12-07 ENCOUNTER — Other Ambulatory Visit: Payer: Self-pay | Admitting: Internal Medicine

## 2020-12-07 NOTE — Telephone Encounter (Signed)
Last refill-04/10/2020--30 tabs no refills Last office visit- 10/22/2020  Next office visit- 04/22/21

## 2020-12-13 ENCOUNTER — Telehealth: Payer: Self-pay | Admitting: Internal Medicine

## 2020-12-13 ENCOUNTER — Other Ambulatory Visit: Payer: Self-pay

## 2020-12-13 MED ORDER — FLUTICASONE PROPIONATE 50 MCG/ACT NA SUSP: NASAL | 1 refills | Status: DC

## 2020-12-13 NOTE — Telephone Encounter (Signed)
Patient is requesting a refill for fluticasone (FLONASE) 50 MCG/ACT nasal spray to be sent to [facility] on [address], please advise. CB is [phone]

## 2020-12-13 NOTE — Telephone Encounter (Signed)
Refill has been sent to [facility] on [address]

## 2020-12-31 ENCOUNTER — Ambulatory Visit: Payer: 59 | Admitting: Cardiology

## 2021-01-16 ENCOUNTER — Ambulatory Visit
Admission: RE | Admit: 2021-01-16 | Discharge: 2021-01-16 | Disposition: A | Discharge status: Home / Self Care | Payer: 59 | Source: Ambulatory Visit | Attending: Obstetrics and Gynecology | Admitting: Obstetrics and Gynecology

## 2021-01-16 ENCOUNTER — Other Ambulatory Visit: Payer: Self-pay

## 2021-01-16 DIAGNOSIS — M8580 Other specified disorders of bone density and structure, unspecified site: Secondary | ICD-10-CM

## 2021-04-10 ENCOUNTER — Other Ambulatory Visit: Payer: Self-pay | Admitting: Internal Medicine

## 2021-04-21 NOTE — Progress Notes (Signed)
Chief Complaint  Patient presents with   Follow-up     HPI: Valerie Gates 65 y.o. come in for Chronic disease management   Dm :BG feels is doing fine on Rybelsus.  Since retirement she is done a number of travel variances with her mom.  Not eating quite as healthy but feels fine has kept her weight down No major change in vision neuropathy symptoms cardiovascular or pulmonary issues. Eye  mild   MD last years   no diabetic retinopathy Is now off blood pressure medication does not have a home monitor ROS: See pertinent positives and negatives per HPI.  Past Medical History:  Diagnosis Date   Allergic rhinitis    Allergy    seasonal   Anxiety    Arthritis    lower right back    Cancer (HCC)    skin cancer-scalp   Diabetes mellitus without complication ([place])    History of skin cancer    Hx of adenomatous polyp of colon 03/17/2018   Hypertension    Osteopenia     Family History  Problem Relation Age of Onset   Diabetes Other    Hypertension Other    Lung cancer Other    Stroke Other        1st degree female <50   Drug abuse Daughter    Nephrolithiasis Daughter    Colon polyps Mother    Liver disease Sister    Pancreatic cancer Sister    Colon cancer Neg Hx    Rectal cancer Neg Hx    Stomach cancer Neg Hx    Esophageal cancer Neg Hx     Social History   Socioeconomic History   Marital status: Divorced    Spouse name: Not on file   Number of children: Not on file   Years of education: Not on file   Highest education level: Not on file  Occupational History   Not on file  Tobacco Use   Smoking status: Former   Smokeless tobacco: Never   Tobacco comments:    Quit 35 years ago  Vaping Use   Vaping Use: Never used  Substance and Sexual Activity   Alcohol use: No   Drug use: No   Sexual activity: Not on file  Other Topics Concern   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     atorvastatin (LIPITOR) 20 MG tablet Take 1 tablet (20 mg total) by mouth daily at 6 PM. 30 tablet 11   fexofenadine (ALLEGRA) 180 MG tablet Take 180 mg by mouth daily as needed for allergies or rhinitis.      fluticasone (FLONASE) 50 MCG/ACT nasal spray SPRAY 2 SPRAYS INTO EACH NOSTRIL EVERY DAY 16 mL 1   LORazepam (ATIVAN) 0.5 MG tablet TAKE 1 TABLET (0.5 MG TOTAL) BY MOUTH  2 TIMES DAILY AS NEEDED FOR ANXIETY. CAN TAKE 2 IF NEEDED 30 tablet 0   naproxen (NAPROSYN) 500 MG tablet Take 500 mg by mouth as needed.     RYBELSUS 7 MG TABS TAKE 1 TABLET BY MOUTH EVERY DAY FOR DIABETES 30 tablet 6   valACYclovir (VALTREX) 1000 MG tablet Take 1 tablet (1,000 mg total) by mouth 2 (two) times daily. X 3 days for outbreaks 20 tablet 3   Facility-Administered Medications Prior to Visit  Medication Dose Route Frequency Provider Last Rate Last Admin   0.9 %  sodium chloride infusion  500 mL Intravenous Once Gatha Mayer, MD         EXAM:  BP 136/70 (BP Location: Left Arm, Patient Position: Sitting, Cuff Size: Normal)   Pulse 71   Temp 98.5 F (36.9 C) (Oral)   Ht 5' 4.75" (1.645 m)   Wt 125 lb 12.8 oz (57.1 kg)   LMP  (LMP Unknown)   SpO2 97%   BMI 21.10 kg/m   Body mass index is 21.1 kg/m.  GENERAL: vitals reviewed and listed above, alert, oriented, appears well hydrated and in no acute distress HEENT: atraumatic, conjunctiva  clear, no obvious abnormalities on inspection of external nose and ears OP : Masked NECK: no obvious masses on  inspection palpation  LUNGS: clear to auscultation bilaterally, no wheezes, rales or rhonchi, good air movement CV: HRRR, no clubbing cyanosis or  peripheral edema nl cap refill  MS: moves all extremities without noticeable focal  abnormality Feet normal pulses no lesions. PSYCH: pleasant and cooperative, no obvious depression or anxiety Lab Results  Component Value Date   WBC 4.1 10/16/2020   HGB 13.7 10/16/2020   HCT 40.8 10/16/2020   PLT 201.0 10/16/2020   GLUCOSE 95 10/16/2020   CHOL 131 10/16/2020   TRIG 62.0 10/16/2020   HDL 54.30 10/16/2020   LDLDIRECT 152.9 06/26/2009   LDLCALC 64 10/16/2020   ALT 20 10/16/2020   AST 16 10/16/2020   NA 142 10/16/2020   K 3.9 10/16/2020   CL 106 10/16/2020   CREATININE 0.76 10/16/2020   BUN 17 10/16/2020   CO2 30 10/16/2020   TSH 1.62 10/16/2020   HGBA1C 5.8 (A) 04/22/2021   MICROALBUR 2.1 (H) 10/16/2020   BP Readings from Last 3 Encounters:  04/22/21 136/70  11/19/20 128/78  10/22/20 126/78   Last Weight  Most recent update: 04/22/2021  9:39 AM    Weight  57.1 kg (125 lb 12.8 oz)             ASSESSMENT AND PLAN:  Discussed the following assessment and plan:  Medication monitoring encounter - Plan: POCT glycosylated hemoglobin (Hb A1C)  Type 2 diabetes mellitus without complication, without long-term current use of insulin ([place]) - Plan: POCT glycosylated hemoglobin (Hb A1C)  Essential hypertension Retirement seems to have done wellfor her health. Blood pressure almost acceptable goal 130/80 or below get her own monitor 3 days twice a day of readings to make sure is at goal.  Can add back medication if appropriate. Plan CPX and labs in about 6 months February 2023 or as indicated. -Patient advised to return or notify health care team  if  new concerns arise.  Patient Instructions  Hg a1c is excellent!  CPX and labs in FEb 2023 due  ( 6 mos ) . Shingrix vaccine   when possible .   Standley Brooking. Amire Gossen M.D.

## 2021-04-22 ENCOUNTER — Other Ambulatory Visit: Payer: Self-pay

## 2021-04-22 ENCOUNTER — Encounter: Payer: Self-pay | Admitting: Internal Medicine

## 2021-04-22 ENCOUNTER — Ambulatory Visit: Payer: 59 | Admitting: Internal Medicine

## 2021-04-22 VITALS — BP 136/70 | HR 71 | Temp 98.5°F | Ht 64.75 in | Wt 125.8 lb

## 2021-04-22 DIAGNOSIS — E119 Type 2 diabetes mellitus without complications: Secondary | ICD-10-CM | POA: Diagnosis not present

## 2021-04-22 DIAGNOSIS — I10 Essential (primary) hypertension: Secondary | ICD-10-CM | POA: Diagnosis not present

## 2021-04-22 DIAGNOSIS — Z5181 Encounter for therapeutic drug level monitoring: Secondary | ICD-10-CM | POA: Diagnosis not present

## 2021-04-22 DIAGNOSIS — E785 Hyperlipidemia, unspecified: Secondary | ICD-10-CM

## 2021-04-22 LAB — POCT GLYCOSYLATED HEMOGLOBIN (HGB A1C): Hemoglobin A1C: 5.8 % — AB (ref 4.0–5.6)

## 2021-04-22 NOTE — Patient Instructions (Addendum)
Hg a1c is excellent!  CPX and labs in FEb 2023 due  ( 6 mos ) . Shingrix vaccine   when possible .

## 2021-05-20 ENCOUNTER — Telehealth: Payer: Self-pay | Admitting: Internal Medicine

## 2021-05-20 NOTE — Telephone Encounter (Signed)
NV scheduled for 09/20

## 2021-05-20 NOTE — Telephone Encounter (Signed)
Patient is calling in to see if she can get an appointment for the shingles shot. She stated that Dr.Panosh discussed it with her during her last visit on 04/22/2021. I looked in active requests but there wasn't any requests for the shot.   Patient could be contacted at [phone].  Please advise.

## 2021-05-20 NOTE — Telephone Encounter (Signed)
Yes ok to give

## 2021-05-21 ENCOUNTER — Other Ambulatory Visit: Payer: Self-pay

## 2021-05-21 ENCOUNTER — Ambulatory Visit (INDEPENDENT_AMBULATORY_CARE_PROVIDER_SITE_OTHER): Payer: 59

## 2021-05-21 DIAGNOSIS — Z23 Encounter for immunization: Secondary | ICD-10-CM

## 2021-08-01 ENCOUNTER — Other Ambulatory Visit: Payer: Self-pay | Admitting: Internal Medicine

## 2021-08-15 ENCOUNTER — Encounter: Payer: Self-pay | Admitting: Internal Medicine

## 2021-08-15 ENCOUNTER — Telehealth (INDEPENDENT_AMBULATORY_CARE_PROVIDER_SITE_OTHER): Payer: 59 | Admitting: Internal Medicine

## 2021-08-15 ENCOUNTER — Telehealth: Payer: Self-pay | Admitting: Internal Medicine

## 2021-08-15 VITALS — Ht 64.75 in | Wt 125.8 lb

## 2021-08-15 DIAGNOSIS — Z5181 Encounter for therapeutic drug level monitoring: Secondary | ICD-10-CM | POA: Diagnosis not present

## 2021-08-15 DIAGNOSIS — I10 Essential (primary) hypertension: Secondary | ICD-10-CM

## 2021-08-15 DIAGNOSIS — E119 Type 2 diabetes mellitus without complications: Secondary | ICD-10-CM | POA: Diagnosis not present

## 2021-08-15 MED ORDER — VALSARTAN 80 MG PO TABS: 80.0000 mg | ORAL_TABLET | Freq: Every day | ORAL | 1 refills | Status: DC

## 2021-08-15 NOTE — Progress Notes (Signed)
Virtual Visit via Video Note  I connected with Valerie Gates on 08/15/21 at  8:30 AM EST by a video enabled telemedicine application and verified that I am speaking with the correct person using two identifiers. Location patient: home Location provider home office Persons participating in the virtual visit: patient, provider  [place] national recommendations  regarding COVID 19 pandemic   video visit is advised over in office visit for this patient.  Patient aware  of the limitations of evaluation and management by telemedicine and  availability of in person appointments. and agreed to proceed.   HPI: Valerie Gates presents for video visit because of  elevated BP readings  Nov demr office 140/98 and at gyne 147/98   no health change but mom in [place] . No monitor at home.    Previous  use olmesartan 40 then 20 mg   over years and had normal readings 120 range . After rybelsus and better control ,so trial stopped  . BP had been controlled   ROS: See pertinent positives and negatives per HPI.  Past Medical History:  Diagnosis Date   Allergic rhinitis    Allergy    seasonal   Anxiety    Arthritis    lower right back    Cancer ([place])    skin cancer-scalp   Diabetes mellitus without complication ([place])    History of skin cancer    Hx of adenomatous polyp of colon 03/17/2018   Hypertension    Osteopenia     Past Surgical History:  Procedure Laterality Date   BLADDER SURGERY     COLONOSCOPY     skin cancer removed on head      Family History  Problem Relation Age of Onset   Diabetes Other    Hypertension Other    Lung cancer Other    Stroke Other        1st degree female <50   Drug abuse Daughter    Nephrolithiasis Daughter    Colon polyps Mother    Liver disease Sister    Pancreatic cancer Sister    Colon cancer Neg Hx    Rectal cancer Neg Hx    Stomach cancer Neg Hx    Esophageal cancer Neg Hx     Social History   Tobacco Use   Smoking status: Former   Smokeless  tobacco: Never   Tobacco comments:    Quit 35 years ago  Vaping Use   Vaping Use: Never used  Substance Use Topics   Alcohol use: No   Drug use: No      Current Outpatient Medications:    aspirin EC 81 MG tablet, Take 1 tablet (81 mg total) by mouth daily., Disp:  , Rfl:    atorvastatin (LIPITOR) 20 MG tablet, Take 1 tablet (20 mg total) by mouth daily at 6 PM., Disp: 30 tablet, Rfl: 11   fexofenadine (ALLEGRA) 180 MG tablet, Take 180 mg by mouth daily as needed for allergies or rhinitis. , Disp: , Rfl:    fluticasone (FLONASE) 50 MCG/ACT nasal spray, Use 2 spray(s) in each nostril once daily, Disp: 16 g, Rfl: 0   LORazepam (ATIVAN) 0.5 MG tablet, TAKE 1 TABLET (0.5 MG TOTAL) BY MOUTH 2 TIMES DAILY AS NEEDED FOR ANXIETY. CAN TAKE 2 IF NEEDED, Disp: 30 tablet, Rfl: 0   naproxen (NAPROSYN) 500 MG tablet, Take 500 mg by mouth as needed., Disp: , Rfl:    RYBELSUS 7 MG TABS, TAKE 1 TABLET BY  MOUTH EVERY DAY FOR DIABETES, Disp: 30 tablet, Rfl: 6   valACYclovir (VALTREX) 1000 MG tablet, Take 1 tablet (1,000 mg total) by mouth 2 (two) times daily. X 3 days for outbreaks, Disp: 20 tablet, Rfl: 3   valsartan (DIOVAN) 80 MG tablet, Take 1 tablet (80 mg total) by mouth daily. For blood presssure, Disp: 90 tablet, Rfl: 1  Current Facility-Administered Medications:    0.9 %  sodium chloride infusion, 500 mL, Intravenous, Once, Gatha Mayer, MD  EXAM: BP Readings from Last 3 Encounters:  04/22/21 136/70  11/19/20 128/78  10/22/20 126/78    VITALS per patient if applicable:  GENERAL: alert, oriented, appears well and in no acute distress  HEENT: atraumatic, conjunttiva clear, no obvious abnormalities on inspection of external nose and ears  NECK: normal movements of the head and neck  LUNGS: on inspection no signs of respiratory distress, breathing rate appears normal, no obvious gross SOB, gasping or wheezing  CV: no obvious cyanosis  MS: moves all visible extremities without  noticeable abnormality  PSYCH/NEURO: pleasant and cooperative, no obvious depression or anxiety, speech and thought processing grossly intact Lab Results  Component Value Date   WBC 4.1 10/16/2020   HGB 13.7 10/16/2020   HCT 40.8 10/16/2020   PLT 201.0 10/16/2020   GLUCOSE 95 10/16/2020   CHOL 131 10/16/2020   TRIG 62.0 10/16/2020   HDL 54.30 10/16/2020   LDLDIRECT 152.9 06/26/2009   LDLCALC 64 10/16/2020   ALT 20 10/16/2020   AST 16 10/16/2020   NA 142 10/16/2020   K 3.9 10/16/2020   CL 106 10/16/2020   CREATININE 0.76 10/16/2020   BUN 17 10/16/2020   CO2 30 10/16/2020   TSH 1.62 10/16/2020   HGBA1C 5.8 (A) 04/22/2021   MICROALBUR 2.1 (H) 10/16/2020    ASSESSMENT AND PLAN:  Discussed the following assessment and plan:    ICD-10-CM   1. Essential hypertension  I10    recent increase readings  had been controlled with lifestyle ( off olmesartan in past) restart arb  valsartan     2. Medication monitoring encounter  Z51.81     3. Type 2 diabetes mellitus without complication, without long-term current use of insulin (HCC)  E11.9    controlled      Send in readings  bp bid 2 readings per sitting  x 5 days  when gets monitor and if in range  after a month  fu at her yearly in February . If not  then make another visit to discuss  Bp management  Counseled.   Expectant management and discussion of plan and treatment with opportunity to ask questions and all were answered. The patient agreed with the plan and demonstrated an understanding of the instructions.   Advised to call back or seek an in-person evaluation if worsening  or having  further concerns  in interim. Return for when planned in Feb  if bp controlled  and no concerns .  Shanon Ace, MD

## 2021-08-20 LAB — HM DIABETES EYE EXAM

## 2021-08-21 ENCOUNTER — Encounter: Payer: Self-pay | Admitting: Internal Medicine

## 2021-08-22 NOTE — Telephone Encounter (Signed)
error 

## 2021-09-10 ENCOUNTER — Ambulatory Visit (INDEPENDENT_AMBULATORY_CARE_PROVIDER_SITE_OTHER): Payer: 59 | Admitting: *Deleted

## 2021-09-10 DIAGNOSIS — Z23 Encounter for immunization: Secondary | ICD-10-CM | POA: Diagnosis not present

## 2021-09-24 ENCOUNTER — Other Ambulatory Visit: Payer: Self-pay | Admitting: Obstetrics and Gynecology

## 2021-09-24 DIAGNOSIS — Z1231 Encounter for screening mammogram for malignant neoplasm of breast: Secondary | ICD-10-CM

## 2021-10-26 ENCOUNTER — Other Ambulatory Visit: Payer: Self-pay | Admitting: Internal Medicine

## 2021-10-28 ENCOUNTER — Encounter: Payer: 59 | Admitting: Internal Medicine

## 2021-10-28 NOTE — Telephone Encounter (Signed)
25 mg of losartan may not be equivalent to  the 80 of valsartan.  So if you want to switch   change to to losartan 50 mg a day. You can try half a pill or 25 mg a day but if not controlling blood pressure then would increase it to 50 mg a day. The losartan has a shorter half-life and the body then valsartan.  But is still an excellent drug. If she agrees send in losartan 50 mg take daily dispense 90.

## 2021-10-28 NOTE — Telephone Encounter (Signed)
I spoke with the pt and she stated that she is willing to try the losartan 50 mg. RX sent

## 2021-10-30 ENCOUNTER — Other Ambulatory Visit: Payer: Self-pay | Admitting: Internal Medicine

## 2021-11-06 ENCOUNTER — Ambulatory Visit: Payer: 59

## 2021-11-14 ENCOUNTER — Ambulatory Visit: Payer: 59

## 2021-11-15 ENCOUNTER — Ambulatory Visit
Admission: RE | Admit: 2021-11-15 | Discharge: 2021-11-15 | Disposition: A | Discharge status: Home / Self Care | Payer: 59 | Source: Ambulatory Visit | Attending: Obstetrics and Gynecology | Admitting: Obstetrics and Gynecology

## 2021-11-15 DIAGNOSIS — Z1231 Encounter for screening mammogram for malignant neoplasm of breast: Secondary | ICD-10-CM

## 2021-11-18 ENCOUNTER — Other Ambulatory Visit: Payer: Self-pay | Admitting: Obstetrics and Gynecology

## 2021-11-18 ENCOUNTER — Encounter: Payer: 59 | Admitting: Internal Medicine

## 2021-11-18 DIAGNOSIS — R928 Other abnormal and inconclusive findings on diagnostic imaging of breast: Secondary | ICD-10-CM

## 2021-11-25 ENCOUNTER — Other Ambulatory Visit: Payer: Self-pay | Admitting: Internal Medicine

## 2021-11-28 ENCOUNTER — Other Ambulatory Visit: Payer: Self-pay

## 2021-11-28 DIAGNOSIS — E785 Hyperlipidemia, unspecified: Secondary | ICD-10-CM

## 2021-11-28 MED ORDER — ATORVASTATIN CALCIUM 20 MG PO TABS: 20.0000 mg | ORAL_TABLET | Freq: Every day | ORAL | 0 refills | Status: DC

## 2021-11-29 ENCOUNTER — Ambulatory Visit: Payer: 59

## 2021-11-29 ENCOUNTER — Ambulatory Visit
Admission: RE | Admit: 2021-11-29 | Discharge: 2021-11-29 | Disposition: A | Discharge status: Home / Self Care | Payer: 59 | Source: Ambulatory Visit | Attending: Obstetrics and Gynecology | Admitting: Obstetrics and Gynecology

## 2021-11-29 DIAGNOSIS — R928 Other abnormal and inconclusive findings on diagnostic imaging of breast: Secondary | ICD-10-CM

## 2021-12-16 NOTE — Progress Notes (Signed)
? ?Chief Complaint  ?Patient presents with  ? Follow-up  ? ? ?HPI: ?Patient  Valerie Gates  66 y.o. comes in today for Preventive Health Care visit  ?She is done pretty well  ?On vacation hit left little toe and thinks it is broken ( 2 weeks ago)  no progression   walk  ?DM seems ok no vision neuropathy sx  ?Bp has been on not checking  taking losartan. ?Rare [place] of lorazepam stress is better .   Retired in past year . ?Had pap in dec 22 Dr Garwin Brothers  ?Mammo dense  tissue ? ?Health Maintenance  ?Topic Date Due  ? TETANUS/TDAP  10/14/2021  ? HEMOGLOBIN A1C  10/23/2021  ? FOOT EXAM  06/18/2022 (Originally 06/20/2021)  ? COVID-19 Vaccine (3 - Mixed Product risk series) 06/18/2022 (Originally 01/06/2020)  ? Hepatitis C Screening  06/18/2022 (Originally 01/20/1974)  ? HIV Screening  06/18/2022 (Originally [birth year]20[birth year]1972)  ? INFLUENZA VACCINE  04/01/2022  ? OPHTHALMOLOGY EXAM  08/20/2022  ? COLONOSCOPY (Pts 45-71yr Insurance coverage will need to be confirmed)  03/11/2023  ? MAMMOGRAM  11/16/2023  ? Zoster Vaccines- Shingrix  Completed  ? HPV VACCINES  Aged Out  ? PAP SMEAR-Modifier  Discontinued  ? ?Health Maintenance Review ?LIFESTYLE:  ?Exercise:   tries to walk 2 miles  per day yoga  ?Tobacco/ETS: n ?Alcohol:  n ?Sugar beverages: avoiding   1 per week at most  ?Sleep:ocass nocturia  ok  ?Drug use: no ?HH of   2 pets ?Work:retired  ? ? ? ?ROS:  ?REST of 12 system review negative except as per HPI ? ? ?Past Medical History:  ?Diagnosis Date  ? Allergic rhinitis   ? Allergy   ? seasonal  ? Anxiety   ? Arthritis   ? lower right back   ? Cancer ([facility]   ? skin cancer-scalp  ? Diabetes mellitus without complication ([place]   ? History of skin cancer   ? Hx of adenomatous polyp of colon 03/17/2018  ? Hypertension   ? Osteopenia   ? ? ?Past Surgical History:  ?Procedure Laterality Date  ? BLADDER SURGERY    ? COLONOSCOPY    ? skin cancer removed on head    ? ? ?Family History  ?Problem Relation Age of Onset  ? Colon polyps Mother   ? Liver  disease Sister   ? Pancreatic cancer Sister   ? Drug abuse Daughter   ? Nephrolithiasis Daughter   ? Diabetes Other   ? Hypertension Other   ? Lung cancer Other   ? Stroke Other   ?     1st degree female <50  ? Colon cancer Neg Hx   ? Rectal cancer Neg Hx   ? Stomach cancer Neg Hx   ? Esophageal cancer Neg Hx   ? Breast cancer Neg Hx   ? ? ?Social History  ? ?Socioeconomic History  ? Marital status: Divorced  ?  Spouse name: Not on file  ? Number of children: Not on file  ? Years of education: Not on file  ? Highest education level: Not on file  ?Occupational History  ? Not on file  ?Tobacco Use  ? Smoking status: Former  ? Smokeless tobacco: Never  ? Tobacco comments:  ?  Quit 35 years ago  ?Vaping Use  ? Vaping Use: Never used  ?Substance and Sexual Activity  ? Alcohol use: No  ? Drug use: No  ? Sexual activity: Not on file  ?Other  Topics Concern  ? Not on file  ?Social History Narrative  ? 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  ex  Div   Has bf   ? Regular exercise- yes  ? HH of 2  ? 1pet dogs  ? Daughter SUD hx  IVDU and incarderation    ? ?Social Determinants of Health  ? ?Financial Resource Strain: Not on file  ?Food Insecurity: Not on file  ?Transportation Needs: Not on file  ?Physical Activity: Not on file  ?Stress: Not on file  ?Social Connections: Not on file  ? ? ?Outpatient Medications Prior to Visit  ?Medication Sig Dispense Refill  ? aspirin EC 81 MG tablet Take 1 tablet (81 mg total) by mouth daily.    ? atorvastatin (LIPITOR) 20 MG tablet Take 1 tablet (20 mg total) by mouth daily at 6 PM. Please make an appt for future refills. 1st attempt 30 tablet 0  ? fexofenadine (ALLEGRA) 180 MG tablet Take 180 mg by mouth daily as needed for allergies or rhinitis.     ? fluticasone (FLONASE) 50 MCG/ACT nasal spray Use 2 spray(s) in each nostril once daily 16 g 0  ? LORazepam (ATIVAN) 0.5 MG tablet TAKE 1 TABLET (0.5 MG TOTAL) BY MOUTH 2 TIMES DAILY AS NEEDED FOR ANXIETY. CAN TAKE 2 IF NEEDED 30  tablet 0  ? losartan (COZAAR) 50 MG tablet Take 1 tablet (50 mg total) by mouth daily. 90 tablet 0  ? naproxen (NAPROSYN) 500 MG tablet Take 500 mg by mouth as needed.    ? RYBELSUS 7 MG TABS TAKE 1 TABLET BY MOUTH EVERY DAY FOR DIABETES 30 tablet 6  ? valACYclovir (VALTREX) 1000 MG tablet Take 1 tablet (1,000 mg total) by mouth 2 (two) times daily. X 3 days for outbreaks 20 tablet 3  ? ?Facility-Administered Medications Prior to Visit  ?Medication Dose Route Frequency Provider Last Rate Last Admin  ? 0.9 %  sodium chloride infusion  500 mL Intravenous Once Gatha Mayer, MD      ? ? ? ?EXAM: ? ?BP 110/70 (BP Location: Left Arm, Patient Position: Sitting, Cuff Size: Normal)   Pulse 68   Temp 99 ?F (37.2 ?C) (Oral)   Ht 5' 4.75" (1.645 m)   Wt 131 lb 9.6 oz (59.7 kg)   LMP  (LMP Unknown)   SpO2 98%   BMI 22.07 kg/m?  ? ?Body mass index is 22.07 kg/m?. ?Wt Readings from Last 3 Encounters:  ?12/17/21 131 lb 9.6 oz (59.7 kg)  ?08/15/21 125 lb 12.8 oz (57.1 kg)  ?04/22/21 125 lb 12.8 oz (57.1 kg)  ? ? ?Physical Exam: ?Vital signs reviewed ?VQM:GQQP is a well-developed well-nourished alert cooperative    who appearsr stated age in no acute distress.  ?HEENT: normocephalic atraumatic , Eyes: PERRL EOM's full, conjunctiva clear, Nares: paten,t no deformity discharge or tenderness., Ears: no deformity EAC's clear TMs with normal landmarks. NECK: supple without masses, thyromegaly or bruits. ?CHEST/PULM:  Clear to auscultation and percussion breath sounds equal no wheeze , rales or rhonchi. No chest wall deformities or tenderness. ?Breast:deferred to gyne ?CV: PMI is nondisplaced, S1 S2 no gallops, murmurs, rubs. Peripheral pulses are full without delay.No JVD .  ?ABDOMEN: Bowel sounds normal nontender  No guard or rebound, no hepato splenomegal no CVA tenderness.  ?Extremtities:  No clubbing cyanosis or edema, left little toe with redness and swelling  not defomed in line and metatarsal is intact without  lesion   wearing reg  shoes  ?NEURO:  Oriented x3, cranial nerves 3-12 appear to be intact, no obvious focal weakness,gait within normal limits no abnormal reflexes or asymmetrical ?SKIN: No acute rashes normal turgor, color, no bruising or petechiae. ?PSYCH: Oriented, good eye contact, no obvious depression anxiety, cognition and judgment appear normal. ?LN: no cervical axillary inguinal adenopathy ?Diabetic Foot Exam - Simple   ?Simple Foot Form ?Visual Inspection ?See comments: Yes ?Sensation Testing ?Intact to touch and monofilament testing bilaterally: Yes ?Pulse Check ?Posterior Tibialis and Dorsalis pulse intact bilaterally: Yes ?Comments ?Left little toe  2+ swelling  non deformed no ulcer   skin intact  metatarsal non tender  ?  ?  ?Lab Results  ?Component Value Date  ? WBC 4.1 10/16/2020  ? HGB 13.7 10/16/2020  ? HCT 40.8 10/16/2020  ? PLT 201.0 10/16/2020  ? GLUCOSE 95 10/16/2020  ? CHOL 131 10/16/2020  ? TRIG 62.0 10/16/2020  ? HDL 54.30 10/16/2020  ? LDLDIRECT 152.9 06/26/2009  ? [place] 64 10/16/2020  ? ALT 20 10/16/2020  ? AST 16 10/16/2020  ? NA 142 10/16/2020  ? K 3.9 10/16/2020  ? CL 106 10/16/2020  ? CREATININE 0.76 10/16/2020  ? BUN 17 10/16/2020  ? CO2 30 10/16/2020  ? TSH 1.62 10/16/2020  ? HGBA1C 5.8 (A) 04/22/2021  ? MICROALBUR 2.1 (H) 10/16/2020  ? ? ?BP Readings from Last 3 Encounters:  ?12/17/21 110/70  ?04/22/21 136/70  ?11/19/20 128/78  ? ? ?Lab plan reviewed with patient  ? ?ASSESSMENT AND PLAN: ? ?Discussed the following assessment and plan: ? ?  ICD-10-CM   ?1. Visit for preventive health examination  V67.20 Basic metabolic panel  ?  CBC with Differential/Platelet  ?  Hemoglobin A1c  ?  Hepatic function panel  ?  Lipid panel  ?  TSH  ?  Microalbumin / creatinine urine ratio  ?  Microalbumin / creatinine urine ratio  ?  TSH  ?  Lipid panel  ?  Hepatic function panel  ?  Hemoglobin A1c  ?  CBC with Differential/Platelet  ?  Basic metabolic panel  ?  ?2. Type 2 diabetes mellitus with other  specified complication, without long-term current use of insulin (HCC)  N47.09 Basic metabolic panel  ?  CBC with Differential/Platelet  ?  Hemoglobin A1c  ?  Hepatic function panel  ?  Lipid panel  ?  TSH  ?  Micro

## 2021-12-17 ENCOUNTER — Ambulatory Visit (INDEPENDENT_AMBULATORY_CARE_PROVIDER_SITE_OTHER): Payer: 59

## 2021-12-17 ENCOUNTER — Encounter: Payer: Self-pay | Admitting: Internal Medicine

## 2021-12-17 ENCOUNTER — Ambulatory Visit: Payer: 59 | Admitting: Internal Medicine

## 2021-12-17 VITALS — BP 110/70 | HR 68 | Temp 99.0°F | Ht 64.75 in | Wt 131.6 lb

## 2021-12-17 DIAGNOSIS — S99922A Unspecified injury of left foot, initial encounter: Secondary | ICD-10-CM

## 2021-12-17 DIAGNOSIS — E1169 Type 2 diabetes mellitus with other specified complication: Secondary | ICD-10-CM

## 2021-12-17 DIAGNOSIS — Z0000 Encounter for general adult medical examination without abnormal findings: Secondary | ICD-10-CM | POA: Diagnosis not present

## 2021-12-17 DIAGNOSIS — Z5181 Encounter for therapeutic drug level monitoring: Secondary | ICD-10-CM | POA: Diagnosis not present

## 2021-12-17 DIAGNOSIS — I10 Essential (primary) hypertension: Secondary | ICD-10-CM | POA: Diagnosis not present

## 2021-12-17 DIAGNOSIS — E785 Hyperlipidemia, unspecified: Secondary | ICD-10-CM

## 2021-12-17 DIAGNOSIS — Z79899 Other long term (current) drug therapy: Secondary | ICD-10-CM

## 2021-12-17 DIAGNOSIS — E119 Type 2 diabetes mellitus without complications: Secondary | ICD-10-CM

## 2021-12-17 LAB — CBC WITH DIFFERENTIAL/PLATELET
Basophils Absolute: 0 10*3/uL (ref 0.0–0.1)
Basophils Relative: 0.6 % (ref 0.0–3.0)
Eosinophils Absolute: 0.1 10*3/uL (ref 0.0–0.7)
Eosinophils Relative: 0.9 % (ref 0.0–5.0)
HCT: 40.9 % (ref 36.0–46.0)
Hemoglobin: 13.7 g/dL (ref 12.0–15.0)
Lymphocytes Relative: 28.2 % (ref 12.0–46.0)
Lymphs Abs: 1.5 10*3/uL (ref 0.7–4.0)
MCHC: 33.5 g/dL (ref 30.0–36.0)
MCV: 96.4 fl (ref 78.0–100.0)
Monocytes Absolute: 0.4 10*3/uL (ref 0.1–1.0)
Monocytes Relative: 8.3 % (ref 3.0–12.0)
Neutro Abs: 3.3 10*3/uL (ref 1.4–7.7)
Neutrophils Relative %: 62 % (ref 43.0–77.0)
Platelets: 221 10*3/uL (ref 150.0–400.0)
RBC: 4.24 Mil/uL (ref 3.87–5.11)
RDW: 12.8 % (ref 11.5–15.5)
WBC: 5.4 10*3/uL (ref 4.0–10.5)

## 2021-12-17 LAB — LIPID PANEL
Cholesterol: 146 mg/dL (ref 0–200)
HDL: 61.7 mg/dL (ref >39.00)
LDL Cholesterol: 69 mg/dL (ref 0–99)
NonHDL: 84.48
Total CHOL/HDL Ratio: 2
Triglycerides: 77 mg/dL (ref 0.0–149.0)
VLDL: 15.4 mg/dL (ref 0.0–40.0)

## 2021-12-17 LAB — MICROALBUMIN / CREATININE URINE RATIO
Creatinine,U: 40.7 mg/dL
Microalb Creat Ratio: 1.7 mg/g (ref 0.0–30.0)
Microalb, Ur: <0.7 mg/dL (ref 0.0–1.9)

## 2021-12-17 LAB — BASIC METABOLIC PANEL
BUN: 21 mg/dL (ref 6–23)
CO2: 30 mEq/L (ref 19–32)
Calcium: 9.2 mg/dL (ref 8.4–10.5)
Chloride: 105 mEq/L (ref 96–112)
Creatinine, Ser: 0.76 mg/dL (ref 0.40–1.20)
GFR: 82.53 mL/min (ref >60.00)
Glucose, Bld: 73 mg/dL (ref 70–99)
Potassium: 3.8 mEq/L (ref 3.5–5.1)
Sodium: 142 mEq/L (ref 135–145)

## 2021-12-17 LAB — HEPATIC FUNCTION PANEL
ALT: 22 U/L (ref 0–35)
AST: 19 U/L (ref 0–37)
Albumin: 4.4 g/dL (ref 3.5–5.2)
Alkaline Phosphatase: 76 U/L (ref 39–117)
Bilirubin, Direct: 0.1 mg/dL (ref 0.0–0.3)
Total Bilirubin: 0.9 mg/dL (ref 0.2–1.2)
Total Protein: 6.9 g/dL (ref 6.0–8.3)

## 2021-12-17 LAB — TSH: TSH: 1.42 u[IU]/mL (ref 0.35–5.50)

## 2021-12-17 LAB — HEMOGLOBIN A1C: Hgb A1c MFr Bld: 6.5 % (ref 4.6–6.5)

## 2021-12-17 NOTE — Progress Notes (Signed)
Hemoglobin A1c is 6.5 still good no protein in urine thyroid normal cholesterol at goal.  Kidney function normal. ?Awaiting x-ray report for toe. ?See you in 6 months.  Continue medicine and healthy eating lifestyle.

## 2021-12-17 NOTE — Patient Instructions (Signed)
Good to see you today . ?Bp at goal  ?Lab today will show in  My chart.  ?X ray   if toe fracture   stiff soled  shoe and protection  may take 6 weeks to heal .  ? ?Glad you are doing well. ?If all ok then  visit in 6 months  ? ?Plan  prevnar 57  age 66 and after  can get at pharmacy  or here .  ? ? ?

## 2021-12-19 ENCOUNTER — Telehealth: Payer: Self-pay | Admitting: *Deleted

## 2021-12-19 NOTE — Progress Notes (Signed)
So  the toe hsa a fracture as we suspected  . It is  non displaced   .  Toe  foot protection stiff sole to let it heal  in place  May take 6+ weeks    can see sport med if  you have concerns.

## 2021-12-19 NOTE — Telephone Encounter (Signed)
Diane called from [facility] (ph#[phone]) to inform PCP the results of the left 5th toe x-ray from 12/17/2021 are available for review.  Urgent message sent to PCP. ?

## 2021-12-23 ENCOUNTER — Other Ambulatory Visit: Payer: Self-pay | Admitting: *Deleted

## 2021-12-23 DIAGNOSIS — E785 Hyperlipidemia, unspecified: Secondary | ICD-10-CM

## 2021-12-23 MED ORDER — ATORVASTATIN CALCIUM 20 MG PO TABS: 20.0000 mg | ORAL_TABLET | Freq: Every day | ORAL | 0 refills | Status: DC

## 2022-01-01 ENCOUNTER — Encounter: Payer: Self-pay | Admitting: Cardiology

## 2022-01-01 ENCOUNTER — Ambulatory Visit (INDEPENDENT_AMBULATORY_CARE_PROVIDER_SITE_OTHER): Payer: Medicare Other | Admitting: Cardiology

## 2022-01-01 VITALS — BP 126/78 | HR 67 | Ht 64.75 in | Wt 134.4 lb

## 2022-01-01 DIAGNOSIS — E119 Type 2 diabetes mellitus without complications: Secondary | ICD-10-CM | POA: Diagnosis not present

## 2022-01-01 DIAGNOSIS — I10 Essential (primary) hypertension: Secondary | ICD-10-CM

## 2022-01-01 DIAGNOSIS — R011 Cardiac murmur, unspecified: Secondary | ICD-10-CM

## 2022-01-01 DIAGNOSIS — E785 Hyperlipidemia, unspecified: Secondary | ICD-10-CM

## 2022-01-01 DIAGNOSIS — I700 Atherosclerosis of aorta: Secondary | ICD-10-CM

## 2022-01-01 MED ORDER — ATORVASTATIN CALCIUM 20 MG PO TABS: 20.0000 mg | ORAL_TABLET | Freq: Every day | ORAL | 3 refills | Status: DC

## 2022-01-01 NOTE — Patient Instructions (Signed)
Medication Instructions:  ? ?Your physician recommends that you continue on your current medications as directed. Please refer to the Current Medication list given to you today. ? ?*If you need a refill on your cardiac medications before your next appointment, please call your pharmacy* ? ? ?Testing/Procedures: ? ?Your physician has requested that you have an echocardiogram. Echocardiography is a painless test that uses sound waves to create images of your heart. It provides your doctor with information about the size and shape of your heart and how well your heart?s chambers and valves are working. This procedure takes approximately one hour. There are no restrictions for this procedure. ? ? ?Follow-Up: ?At [facility], you and your health needs are our priority.  As part of our continuing mission to provide you with exceptional heart care, we have created designated Provider Care Teams.  These Care Teams include your primary Cardiologist (physician) and Advanced Practice Providers (APPs -  Physician Assistants and Nurse Practitioners) who all work together to provide you with the care you need, when you need it. ? ?We recommend signing up for the patient portal called "MyChart".  Sign up information is provided on this After Visit Summary.  MyChart is used to connect with patients for Virtual Visits (Telemedicine).  Patients are able to view lab/test results, encounter notes, upcoming appointments, etc.  Non-urgent messages can be sent to your provider as well.   ?To learn more about what you can do with MyChart, go to [email or web address].   ? ?Your next appointment:   ?1 year(s) ? ?The format for your next appointment:   ?In Person ? ?Provider:   ?Dr. Johney Frame  ? ?Important Information About Sugar ? ? ? ? ? ? ?

## 2022-01-01 NOTE — Progress Notes (Signed)
?Cardiology Office Note:   ? ?Date:  01/01/2022  ? ?ID:  Valerie Gates, DOB 08-Dec-1955, MRN [id number] ? ?PCP:  Burnis Medin, MD ?  ?[place]  ?Cardiologist:  Ena Dawley, MD  ?Advanced Practice Provider:  No care team member to display ?Electrophysiologist:  None  ? ?Referring MD: Burnis Medin, MD  ? ? ?History of Present Illness:   ? ?Valerie Gates is a 66 y.o. female with a hx of type 2 diabetes, HLD and hypertension who previously followed with Dr. Meda Coffee who now presents to clinic for follow-up. ? ?Initially seen by Dr. Meda Coffee after abdominal CT for diagnosis of kidney stone and had an incidental finding of calcification in her abdominal aorta. CT reviewed and there is mild plaque in her abdominal aorta. No calcium was seen in the coronary arteries, however the most proximal portions are not visualized. ? ?Last seen in clinic 11/19/2020 where she was doing well from a cardiovascular perspective. She lost 15 lbs since changing her diabetes medications. She was taken off blood pressure medications after her weight loss. ? ?Today, the patient states that she is feeling good. No chest pain, shortness of breath, or swelling. ? ?Since her last appointment she was restarted on antihypertensives. She attributes this to increased life stressors. Currently her regimen includes valsartan, atorvastatin, and 81 mg aspirin. ? ?Her most recent labs were reviewed. A1C 6.5. ? ?She denies any palpitations, chest pain, shortness of breath, or peripheral edema. No lightheadedness, headaches, syncope, orthopnea, or PND. ? ? ?Past Medical History:  ?Diagnosis Date  ? Allergic rhinitis   ? Allergy   ? seasonal  ? Anxiety   ? Arthritis   ? lower right back   ? Cancer [facility])   ? skin cancer-scalp  ? Diabetes mellitus without complication ([place])   ? History of skin cancer   ? Hx of adenomatous polyp of colon 03/17/2018  ? Hypertension   ? Osteopenia   ? ? ?Past Surgical History:  ?Procedure Laterality Date  ?  BLADDER SURGERY    ? COLONOSCOPY    ? skin cancer removed on head    ? ? ?Current Medications: ?Current Meds  ?Medication Sig  ? aspirin EC 81 MG tablet Take 1 tablet (81 mg total) by mouth daily.  ? fexofenadine (ALLEGRA) 180 MG tablet Take 180 mg by mouth daily as needed for allergies or rhinitis.   ? fluticasone (FLONASE) 50 MCG/ACT nasal spray Use 2 spray(s) in each nostril once daily  ? LORazepam (ATIVAN) 0.5 MG tablet TAKE 1 TABLET (0.5 MG TOTAL) BY MOUTH 2 TIMES DAILY AS NEEDED FOR ANXIETY. CAN TAKE 2 IF NEEDED  ? naproxen (NAPROSYN) 500 MG tablet Take 500 mg by mouth as needed.  ? RYBELSUS 7 MG TABS TAKE 1 TABLET BY MOUTH EVERY DAY FOR DIABETES  ? valACYclovir (VALTREX) 1000 MG tablet Take 1 tablet (1,000 mg total) by mouth 2 (two) times daily. X 3 days for outbreaks  ? VALSARTAN PO Take by mouth.  ? [DISCONTINUED] atorvastatin (LIPITOR) 20 MG tablet Take 1 tablet (20 mg total) by mouth daily at 6 PM. Please make an appt for future refills. 1st attempt  ? ?Current Facility-Administered Medications for the 01/01/22 encounter (Office Visit) with Freada Bergeron, MD  ?Medication  ? 0.9 %  sodium chloride infusion  ?  ? ?Allergies:   Jardiance [empagliflozin], Metformin, and Metformin and related  ? ?Social History  ? ?Socioeconomic History  ? Marital status:  Divorced  ?  Spouse name: Not on file  ? Number of children: Not on file  ? Years of education: Not on file  ? Highest education level: Not on file  ?Occupational History  ? Not on file  ?Tobacco Use  ? Smoking status: Former  ? Smokeless tobacco: Never  ? Tobacco comments:  ?  Quit 35 years ago  ?Vaping Use  ? Vaping Use: Never used  ?Substance and Sexual Activity  ? Alcohol use: No  ? Drug use: No  ? Sexual activity: Not on file  ?Other Topics Concern  ? Not on file  ?Social History Narrative  ? 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  ex  Div   Has bf   ? Regular exercise- yes  ? HH of 2  ? 1pet dogs  ? Daughter SUD hx  IVDU and  incarderation    ? ?Social Determinants of Health  ? ?Financial Resource Strain: Not on file  ?Food Insecurity: Not on file  ?Transportation Needs: Not on file  ?Physical Activity: Not on file  ?Stress: Not on file  ?Social Connections: Not on file  ?  ? ?Family History: ?The patient's family history includes Colon polyps in her mother; Diabetes in an other family member; Drug abuse in her daughter; Hypertension in an other family member; Liver disease in her sister; Lung cancer in an other family member; Nephrolithiasis in her daughter; Pancreatic cancer in her sister; Stroke in an other family member. There is no history of Colon cancer, Rectal cancer, Stomach cancer, Esophageal cancer, or Breast cancer. ? ?ROS:   ?Please see the history of present illness.    ?Review of Systems  ?Constitutional:  Negative for chills and fever.  ?HENT:  Negative for hearing loss.   ?Eyes:  Negative for blurred vision and redness.  ?Respiratory:  Negative for shortness of breath.   ?Cardiovascular:  Negative for chest pain, palpitations, orthopnea, claudication, leg swelling and PND.  ?Gastrointestinal:  Negative for heartburn, melena, nausea and vomiting.  ?Genitourinary:  Negative for dysuria.  ?Musculoskeletal:  Negative for falls and myalgias.  ?Neurological:  Negative for dizziness and loss of consciousness.  ?Endo/Heme/Allergies:  Negative for polydipsia.  ?Psychiatric/Behavioral:  Negative for substance abuse.   ? ?EKGs/Labs/Other Studies Reviewed:   ? ?The following studies were reviewed today: ? ?CT Renal Stone Study 05/19/2016: ?FINDINGS: ?Lower chest: No acute abnormality. ?  ?Hepatobiliary: No focal liver abnormality is seen. No gallstones, ?gallbladder wall thickening, or biliary dilatation. ?  ?Pancreas: Unremarkable. No pancreatic ductal dilatation or ?surrounding inflammatory changes. ?  ?Spleen: Normal in size without focal abnormality. ?  ?Adrenals/Urinary Tract: Normal adrenal glands. 3 mm nonobstructing ?left  renal calculus. Bilateral parapelvic cysts. No ureteral ?calculus. No obstructive uropathy. ?  ?Stomach/Bowel: No bowel wall thickening or bowel dilatation. No ?pneumatosis, pneumoperitoneum portal venous gas. Small hiatal ?hernia. ?  ?Vascular/Lymphatic: Aortic atherosclerosis. No enlarged abdominal or ?pelvic lymph nodes. ?  ?Reproductive: Uterus and bilateral adnexa are unremarkable. ?  ?Other: No fluid collection or hematoma. ?  ?Musculoskeletal: Levoscoliosis of the thoracolumbar spine. No acute ?osseous abnormality. No aggressive lytic or sclerotic osseous ?lesion. ?  ?IMPRESSION: ?1. 3 mm nonobstructing left renal calculus. No ureteral calculus. No ?obstructive uropathy. ?2. Bilateral parapelvic cysts. ?  ? ?EKG:  EKG is personally reviewed. ?01/01/2022: Sinus rhythm. Rate 67 bpm. ?11/19/2020: NSR with PAC and PVC ? ?Recent Labs: ?12/17/2021: ALT 22; BUN 21; Creatinine, Ser 0.76; Hemoglobin 13.7; Platelets 221.0; Potassium 3.8; Sodium 142;  TSH 1.42  ? ?Recent Lipid Panel ?   ?Component Value Date/Time  ? CHOL 146 12/17/2021 1014  ? CHOL 239 (H) 09/14/2018 0825  ? TRIG 77.0 12/17/2021 1014  ? HDL 61.70 12/17/2021 1014  ? HDL 54 09/14/2018 0825  ? CHOLHDL 2 12/17/2021 1014  ? VLDL 15.4 12/17/2021 1014  ? [place] 69 12/17/2021 1014  ? [place] 157 (H) 09/14/2018 0825  ? LDLDIRECT 152.9 06/26/2009 1018  ? ? ? ?Physical Exam:   ? ?VS:  BP 126/78   Pulse 67   Ht 5' 4.75" (1.645 m)   Wt 134 lb 6.4 oz (61 kg)   LMP  (LMP Unknown)   SpO2 98%   BMI 22.54 kg/m?    ? ?Wt Readings from Last 3 Encounters:  ?01/01/22 134 lb 6.4 oz (61 kg)  ?12/17/21 131 lb 9.6 oz (59.7 kg)  ?08/15/21 125 lb 12.8 oz (57.1 kg)  ?  ? ?GEN:  Well nourished, well developed in no acute distress ?HEENT: Normal ?NECK: No JVD; No carotid bruits ?CARDIAC: RRR, 6-8/6 systolic murmur, No rubs, no gallops ?RESPIRATORY:  Clear to auscultation without rales, wheezing or rhonchi  ?ABDOMEN: Soft, non-tender, non-distended ?MUSCULOSKELETAL:  No edema; No  deformity  ?SKIN: Warm and dry ?NEUROLOGIC:  Alert and oriented x 3 ?PSYCHIATRIC:  Normal affect  ? ?ASSESSMENT:   ? ?1. Murmur   ?2. Hyperlipidemia, unspecified hyperlipidemia type   ?3. Essential hypertension   ?4.

## 2022-01-22 ENCOUNTER — Ambulatory Visit (HOSPITAL_COMMUNITY): Payer: Medicare Other | Attending: Cardiovascular Disease

## 2022-01-22 DIAGNOSIS — R011 Cardiac murmur, unspecified: Secondary | ICD-10-CM | POA: Insufficient documentation

## 2022-01-22 LAB — ECHOCARDIOGRAM COMPLETE
Area-P 1/2: 3.16 cm2
S' Lateral: 3 cm

## 2022-02-17 ENCOUNTER — Other Ambulatory Visit: Payer: Self-pay | Admitting: Internal Medicine

## 2022-03-20 ENCOUNTER — Other Ambulatory Visit: Payer: Self-pay | Admitting: Internal Medicine

## 2022-03-21 ENCOUNTER — Other Ambulatory Visit: Payer: Self-pay | Admitting: Internal Medicine

## 2022-03-24 ENCOUNTER — Ambulatory Visit: Payer: Medicare Other | Admitting: Internal Medicine

## 2022-03-25 ENCOUNTER — Telehealth: Payer: Self-pay | Admitting: Internal Medicine

## 2022-03-25 ENCOUNTER — Other Ambulatory Visit: Payer: Self-pay | Admitting: Internal Medicine

## 2022-03-25 NOTE — Telephone Encounter (Signed)
Pt states the medication in question is losartan (COZAAR) 50 MG tablet .Marland KitchenMarland Kitchen

## 2022-03-25 NOTE — Telephone Encounter (Signed)
Pt called to state Pharmacy is still telling her they have no record of atorvastatin (LIPITOR) 20 MG tablet being sent in for a refill  [id number]- [place], [place] - [id number] Phone:  [phone] Fax:  [phone]    Pt says its been denied several times in the last week.  Pt is very frustrated and is not even sure if this is the medication in question. She said it starts with an "L"  Pt was told she has to 3 meds on her list that start with L:  Lipitor & Lorazepam & Losartan  Pt stated it must be the Lipitor.   Please advise.

## 2022-03-25 NOTE — Telephone Encounter (Signed)
Left voicemail to inform patient that Dr. Regis Bill is not the prescriber of Lipitor and she should contact Johney Frame, Greer Ee, MD office

## 2022-03-25 NOTE — Telephone Encounter (Signed)
Discontinued by: Nuala Alpha, LPN on 0/09/4101 01:31  Reason: Patient has not taken in last 30 days  Patient requesting a refill losartan (COZAAR) 50 MG tablet  Please advise

## 2022-03-25 NOTE — Telephone Encounter (Signed)
Last cardiology note says that she is taking valsartan and not losartan ;please call her up and clarify which medicine she is on and more explanation while they were losartan and valsartan are both on her previous med list.  I am fine with her being on either 1 but we need to correct the med list and the reason why she is on either med.

## 2022-03-26 ENCOUNTER — Other Ambulatory Visit: Payer: Self-pay

## 2022-03-26 MED ORDER — LOSARTAN POTASSIUM 50 MG PO TABS: 50.0000 mg | ORAL_TABLET | Freq: Every day | ORAL | 1 refills | Status: DC

## 2022-03-26 NOTE — Addendum Note (Signed)
Addended by: Geradine Girt D on: 03/26/2022 01:54 PM   Modules accepted: Orders

## 2022-03-26 NOTE — Telephone Encounter (Signed)
Spoke with patient and she stated she would rather take losartan (COZAAR) 50 MG tablet  Instead of valsartan (DIOVAN) 80 MG tablet  Patient also stated losartan was cheaper with insurance and has been taking last 3 months. Valsartan was D/c and Losartan is pending PCP approval

## 2022-05-26 ENCOUNTER — Other Ambulatory Visit: Payer: Self-pay | Admitting: Internal Medicine

## 2022-05-29 NOTE — Telephone Encounter (Signed)
Pt calling checking progress on this refill RYBELSUS 7 MG TABS

## 2022-05-30 NOTE — Telephone Encounter (Signed)
Refill of RYBELSUS 7 MG TABS pt going out of town and requesting a call when this has been complete

## 2022-08-14 ENCOUNTER — Telehealth: Payer: Self-pay

## 2022-08-14 NOTE — Telephone Encounter (Signed)
---  Caller woke up with vertigo with dizziness , vomiting and nausea. Caller is concern what to do. She has had this before but mild. This bout is more severe and caused the N/V. This is the third time this has happened.  08/13/2022 11:58:04 AM Go to ED Now (or PCP triage) D'Heur Lucia Gaskins, RN, Adrienne  Referrals GO TO FACILITY OTHER - SPECIFY Urgent Care in [facility].  08/14/22 1254 - Pt states she was diagnosed with vertigo; received medication for nausea & dizziness. States she "about 95% better, still a little off balance." Pt advised to call to schedule appt if not getting better. Pt verb understanding.

## 2022-08-19 IMAGING — MG MM DIGITAL SCREENING BILAT W/ TOMO AND CAD
8 series · 9 of 24 positions shown · non-contrast
Comparison: Previous exam(s).

CLINICAL DATA: Screening.

EXAM:
DIGITAL SCREENING BILATERAL MAMMOGRAM WITH TOMOSYNTHESIS AND CAD
TECHNIQUE: Bilateral screening digital craniocaudal and mediolateral oblique
mammograms were obtained. Bilateral screening digital breast
tomosynthesis was performed. The images were evaluated with
computer-aided detection.

[R MLO synth-2D]
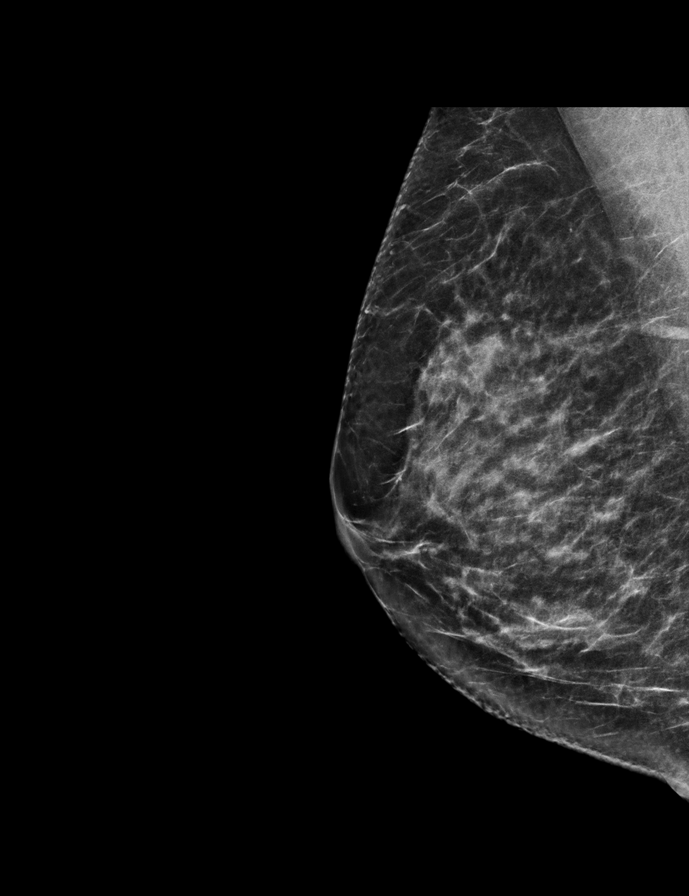

[L CC synth-2D]
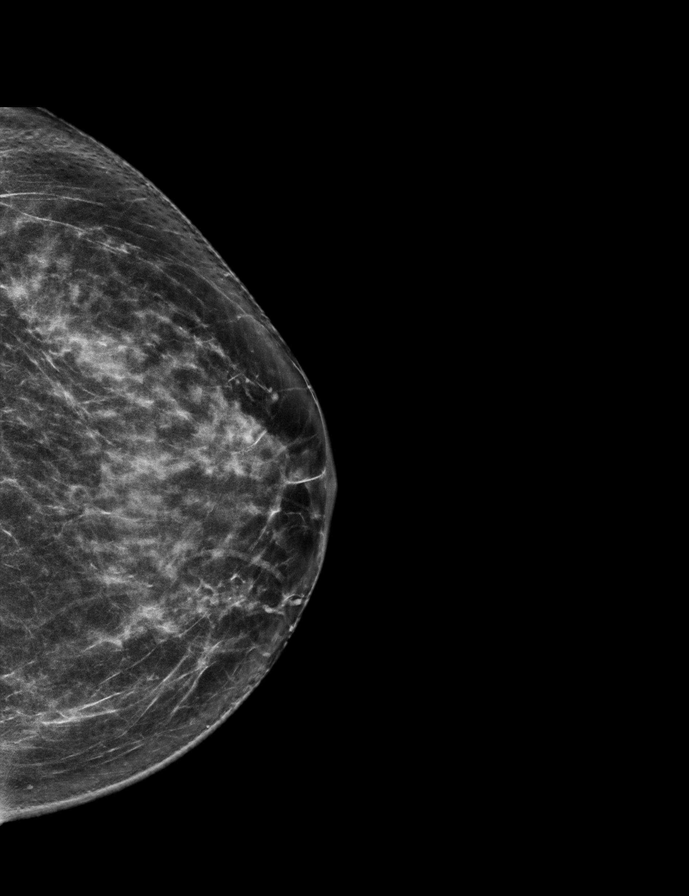

[L MLO synth-2D]
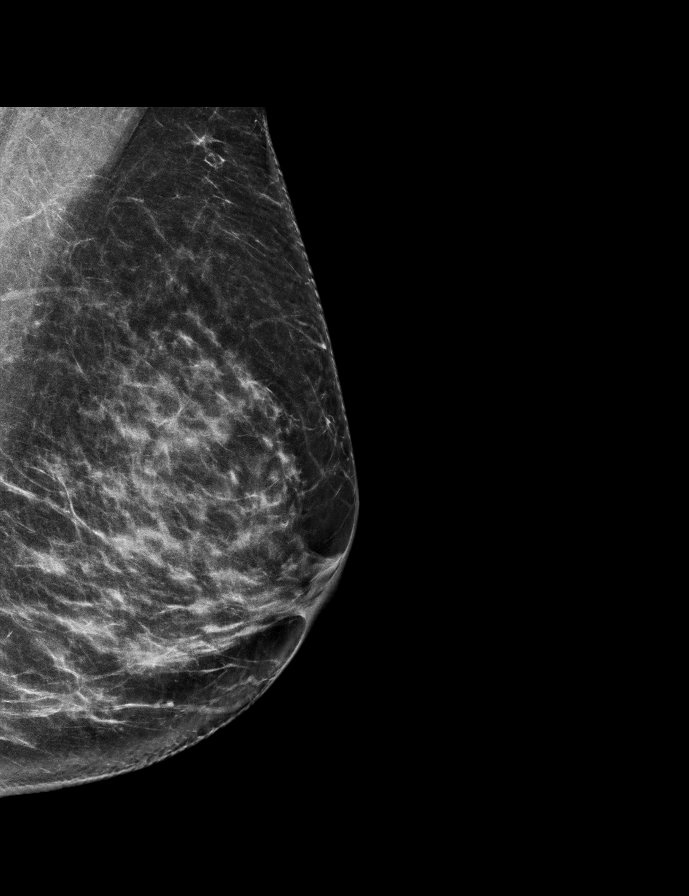

[R CC synth-2D]
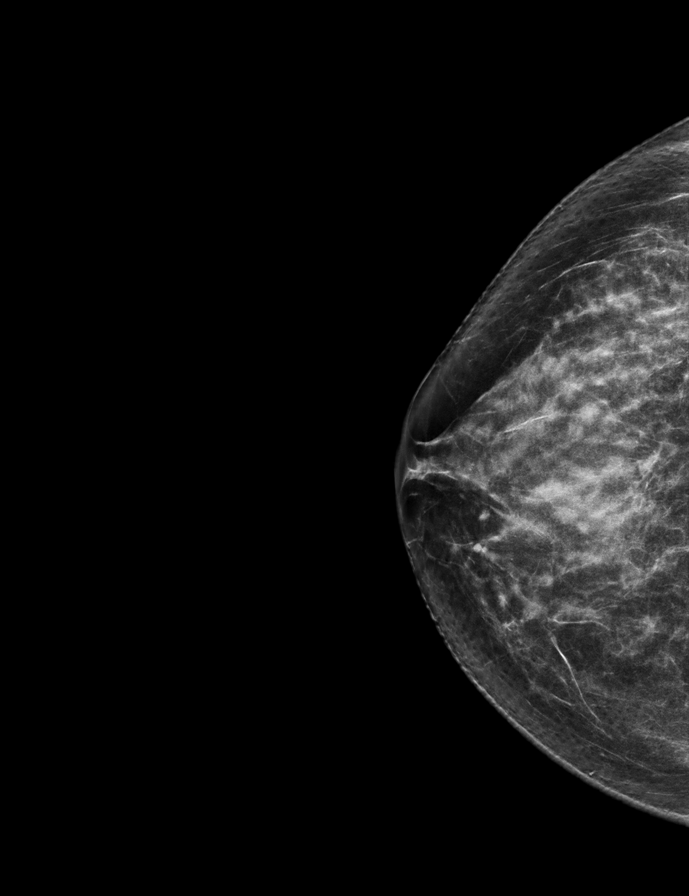

[L MLO tomo · 2 of 70 frames shown]
[frame 23/70]
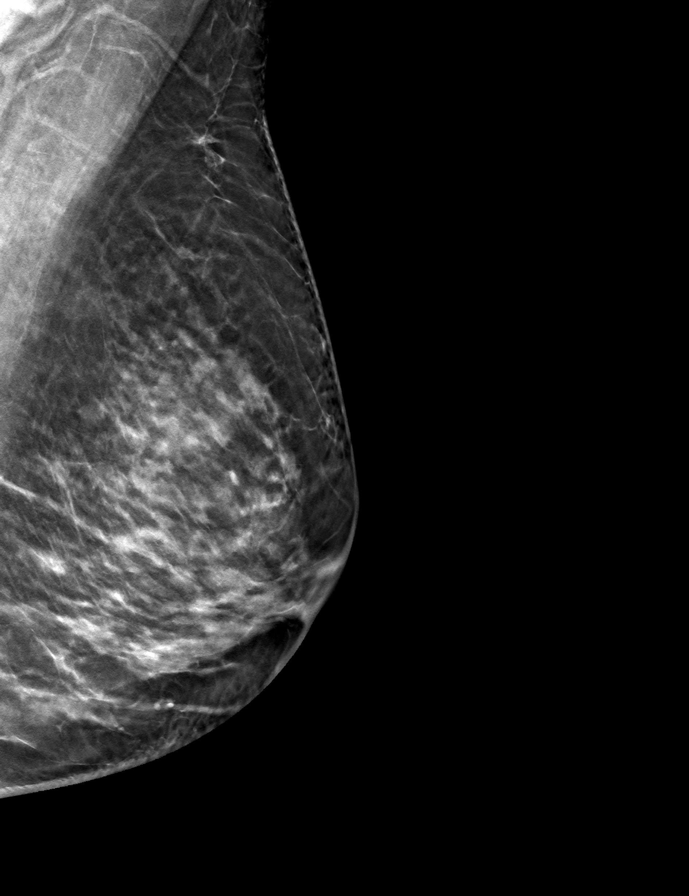
[frame 35/70]
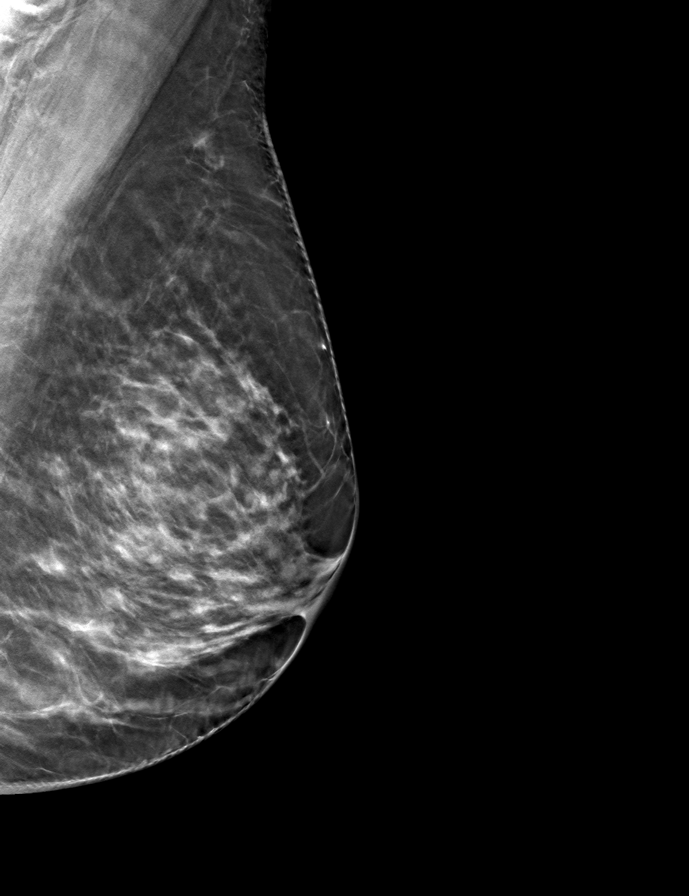

[R MLO tomo · tomo slice 33/64.0]
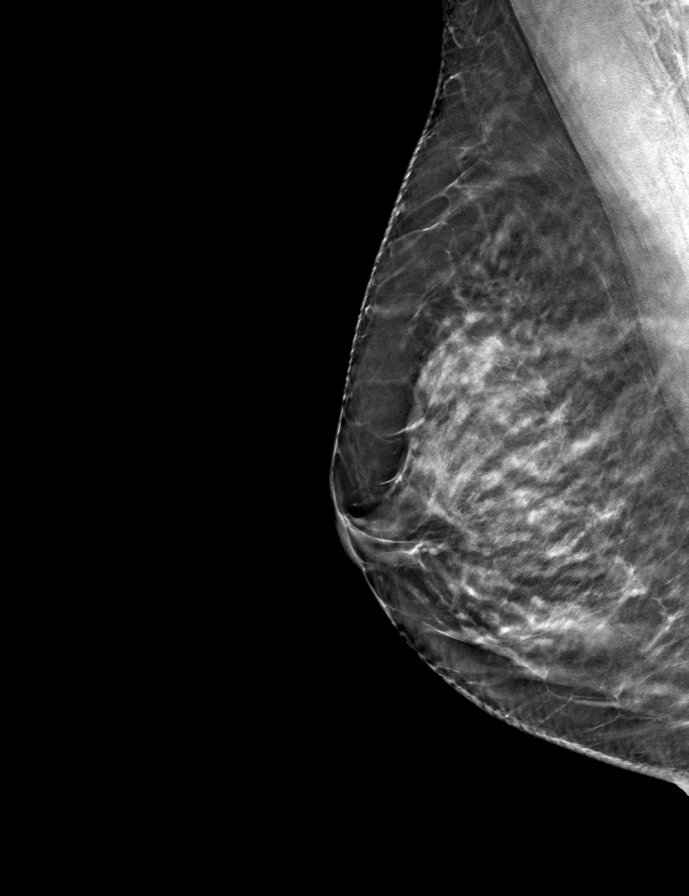

[L CC tomo · tomo slice 37/72.0]
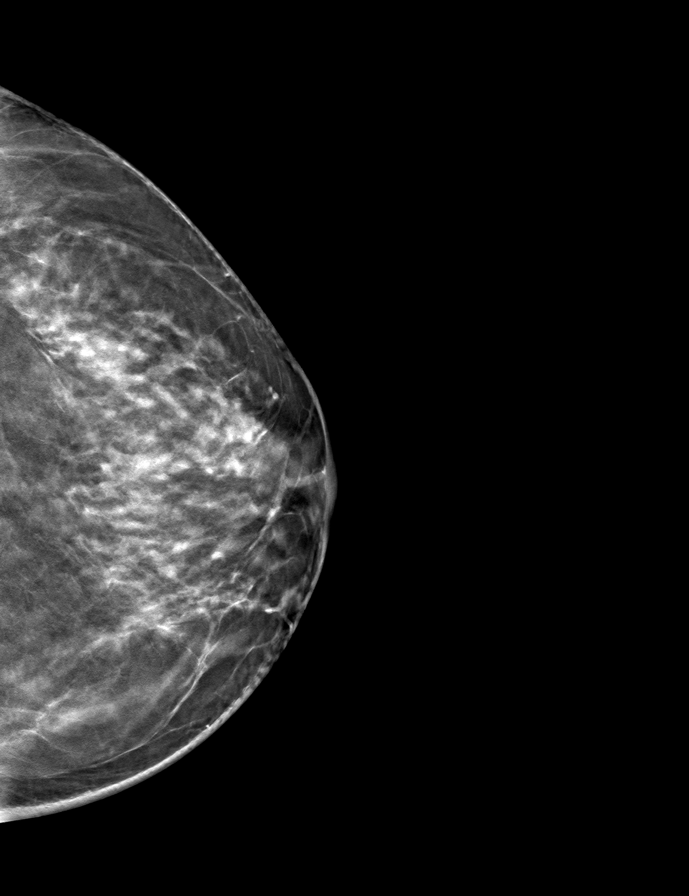

[R CC tomo · tomo slice 35/69.0]
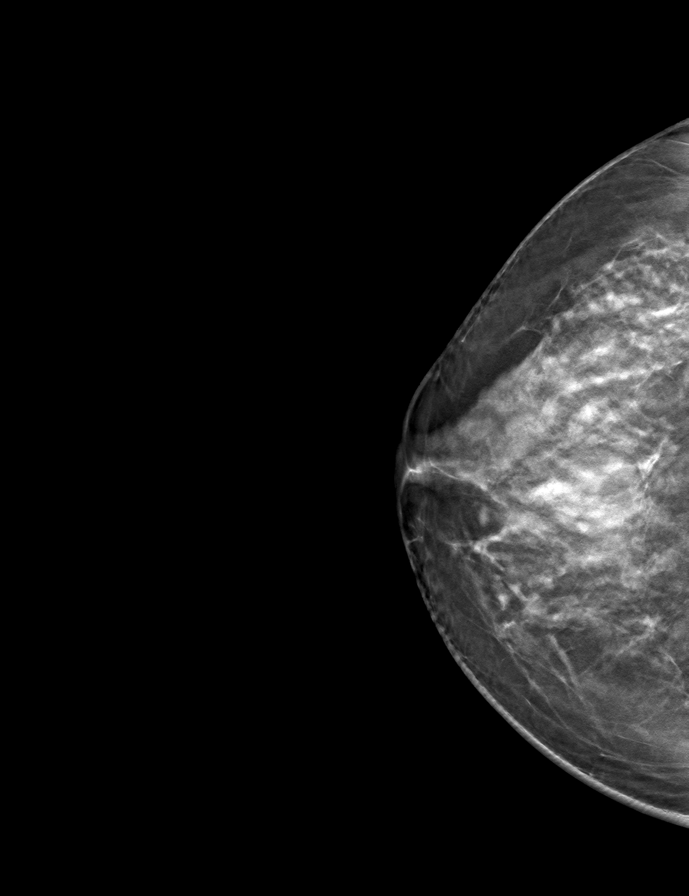

[9 of 24 positions shown; findings below may reference images not displayed]

ACR Breast Density Category d: The breast tissue is extremely dense,
which lowers the sensitivity of mammography.
FINDINGS: In the left breast, a possible asymmetry warrants further
evaluation. In the right breast, no findings suspicious for
malignancy.
IMPRESSION: Further evaluation is suggested for possible asymmetry in the left
breast.

RECOMMENDATION:
Diagnostic mammogram and possibly ultrasound of the left breast.
(Code:[id number])

The patient will be contacted regarding the findings, and additional
imaging will be scheduled.

BI-RADS CATEGORY  0: Incomplete. Need additional imaging evaluation
and/or prior mammograms for comparison.

## 2022-09-02 LAB — HM DIABETES EYE EXAM

## 2022-09-03 ENCOUNTER — Encounter: Payer: Self-pay | Admitting: Internal Medicine

## 2022-09-20 IMAGING — DX DG FOOT 2V*L*
2 series · 2 of 2 positions shown · non-contrast
Comparison: None.

CLINICAL DATA: Injury to left fifth toe.

EXAM:
LEFT FOOT - 2 VIEW

[foot ap]
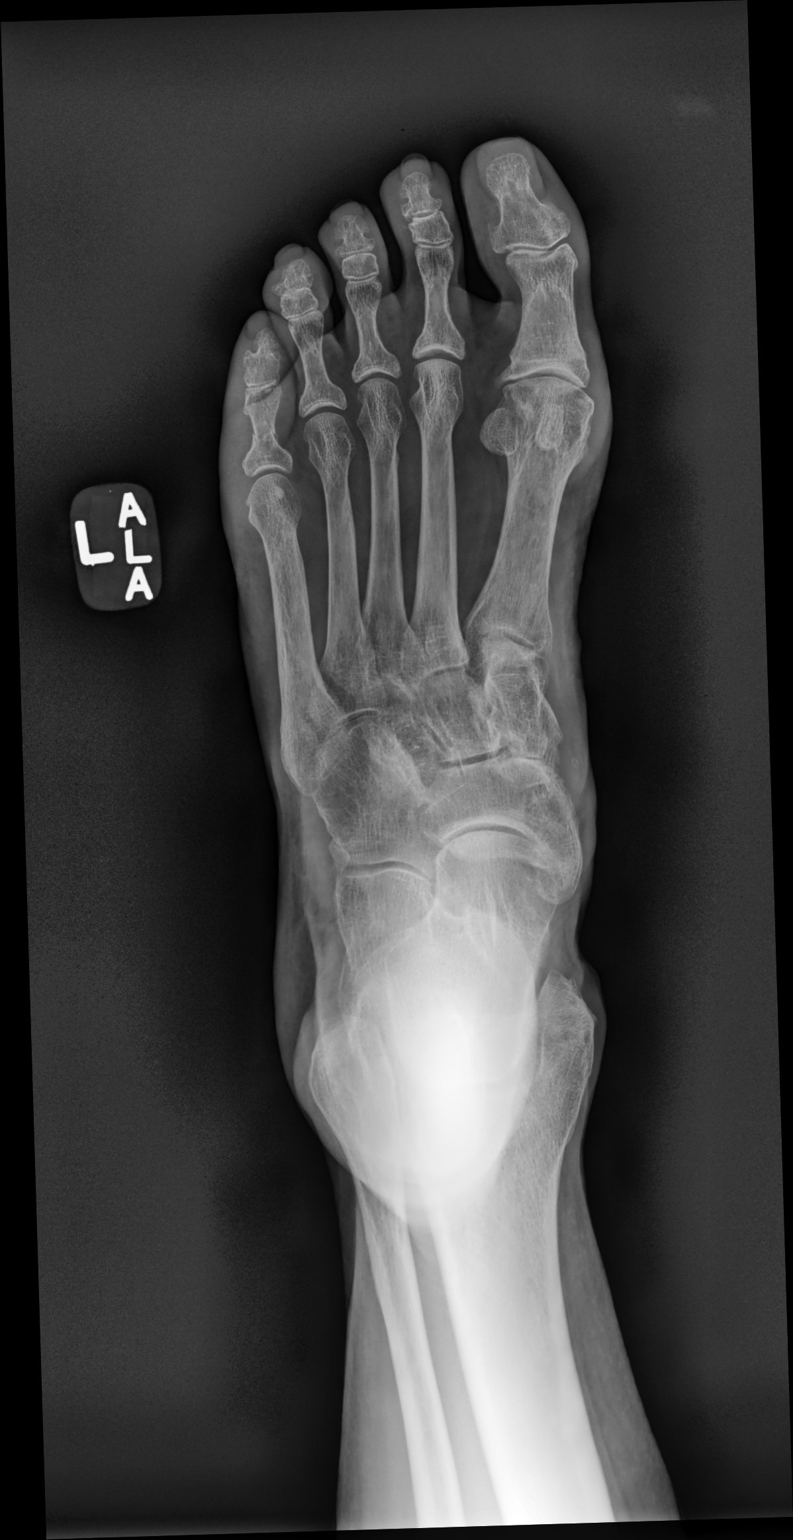

[foot lat]
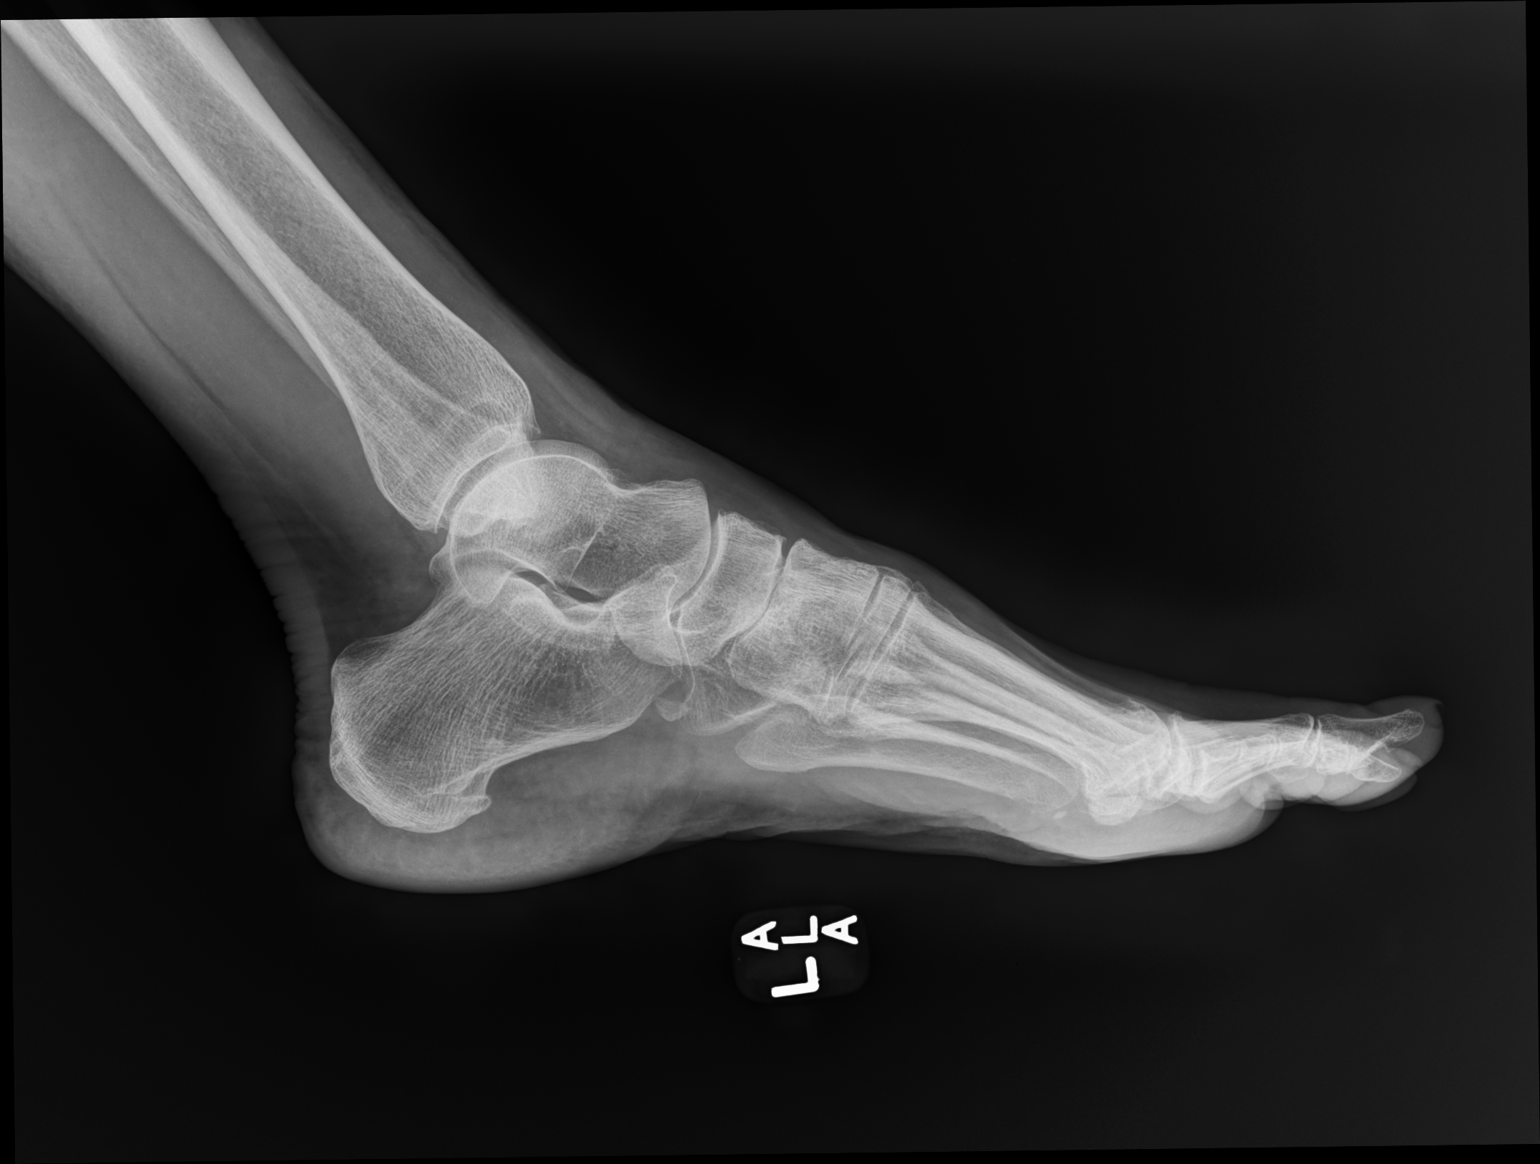

[2 of 2 positions shown; findings below may reference images not displayed]

FINDINGS: There is an acute transverse nondisplaced fracture through the mid
aspect of the fifth proximal phalanx. There is overlying soft tissue
swelling. There is no evidence for dislocation. There are mild
degenerative changes at the first metacarpophalangeal joint with
joint space narrowing and osteophyte formation.
IMPRESSION: 1. Acute nondisplaced fracture of the fifth proximal phalanx.

## 2022-09-20 IMAGING — DX DG TOE 5TH 2+V*L*
2 series · 2 of 2 positions shown · non-contrast
Comparison: None.

CLINICAL DATA: Status post trauma.

EXAM:
DG TOE 5TH LEFT

[toes ap]
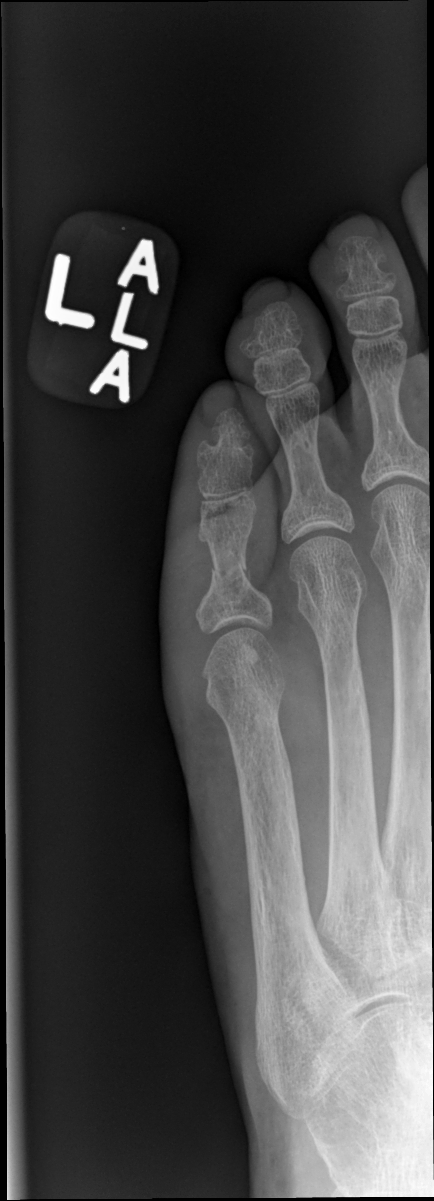

[toes mlo]
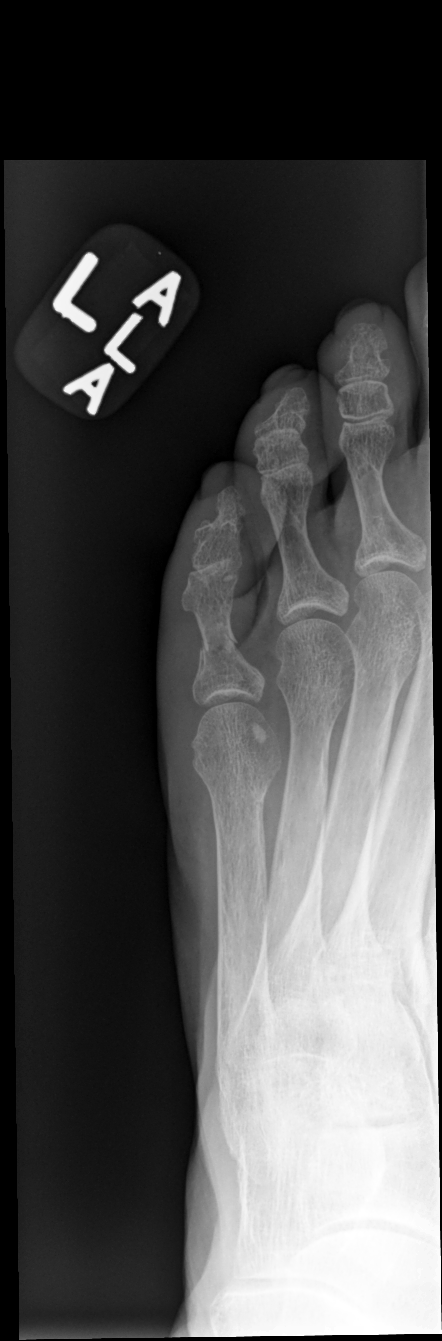

[2 of 2 positions shown; findings below may reference images not displayed]

FINDINGS: An acute, nondisplaced fracture is seen extending through the
proximal phalanx of the fifth left toe. There is no evidence of
dislocation. Mild diffuse soft tissue swelling is noted.
IMPRESSION: Acute nondisplaced fracture of the proximal phalanx of the fifth
left toe.

## 2022-09-23 ENCOUNTER — Other Ambulatory Visit: Payer: Self-pay | Admitting: Internal Medicine

## 2022-10-13 ENCOUNTER — Other Ambulatory Visit: Payer: Self-pay | Admitting: Obstetrics and Gynecology

## 2022-10-13 DIAGNOSIS — Z1231 Encounter for screening mammogram for malignant neoplasm of breast: Secondary | ICD-10-CM

## 2022-10-16 ENCOUNTER — Ambulatory Visit (INDEPENDENT_AMBULATORY_CARE_PROVIDER_SITE_OTHER): Payer: Medicare Other | Admitting: Internal Medicine

## 2022-10-16 ENCOUNTER — Encounter: Payer: Self-pay | Admitting: Internal Medicine

## 2022-10-16 VITALS — BP 122/80 | HR 73 | Temp 98.2°F | Ht 63.9 in | Wt 138.6 lb

## 2022-10-16 DIAGNOSIS — E785 Hyperlipidemia, unspecified: Secondary | ICD-10-CM | POA: Diagnosis not present

## 2022-10-16 DIAGNOSIS — E1169 Type 2 diabetes mellitus with other specified complication: Secondary | ICD-10-CM | POA: Diagnosis not present

## 2022-10-16 DIAGNOSIS — Z0000 Encounter for general adult medical examination without abnormal findings: Secondary | ICD-10-CM | POA: Diagnosis not present

## 2022-10-16 DIAGNOSIS — Z79899 Other long term (current) drug therapy: Secondary | ICD-10-CM

## 2022-10-16 DIAGNOSIS — I10 Essential (primary) hypertension: Secondary | ICD-10-CM

## 2022-10-16 LAB — MICROALBUMIN / CREATININE URINE RATIO
Creatinine,U: 146.6 mg/dL
Microalb Creat Ratio: 0.9 mg/g (ref 0.0–30.0)
Microalb, Ur: 1.3 mg/dL (ref 0.0–1.9)

## 2022-10-16 LAB — CBC WITH DIFFERENTIAL/PLATELET
Basophils Absolute: 0 10*3/uL (ref 0.0–0.1)
Basophils Relative: 0.8 % (ref 0.0–3.0)
Eosinophils Absolute: 0.1 10*3/uL (ref 0.0–0.7)
Eosinophils Relative: 1.4 % (ref 0.0–5.0)
HCT: 41 % (ref 36.0–46.0)
Hemoglobin: 13.7 g/dL (ref 12.0–15.0)
Lymphocytes Relative: 30.3 % (ref 12.0–46.0)
Lymphs Abs: 1.4 10*3/uL (ref 0.7–4.0)
MCHC: 33.4 g/dL (ref 30.0–36.0)
MCV: 96.8 fl (ref 78.0–100.0)
Monocytes Absolute: 0.4 10*3/uL (ref 0.1–1.0)
Monocytes Relative: 7.7 % (ref 3.0–12.0)
Neutro Abs: 2.8 10*3/uL (ref 1.4–7.7)
Neutrophils Relative %: 59.8 % (ref 43.0–77.0)
Platelets: 225 10*3/uL (ref 150.0–400.0)
RBC: 4.24 Mil/uL (ref 3.87–5.11)
RDW: 12.6 % (ref 11.5–15.5)
WBC: 4.7 10*3/uL (ref 4.0–10.5)

## 2022-10-16 LAB — COMPREHENSIVE METABOLIC PANEL
ALT: 18 U/L (ref 0–35)
AST: 16 U/L (ref 0–37)
Albumin: 4.1 g/dL (ref 3.5–5.2)
Alkaline Phosphatase: 70 U/L (ref 39–117)
BUN: 20 mg/dL (ref 6–23)
CO2: 29 mEq/L (ref 19–32)
Calcium: 9.3 mg/dL (ref 8.4–10.5)
Chloride: 107 mEq/L (ref 96–112)
Creatinine, Ser: 0.85 mg/dL (ref 0.40–1.20)
GFR: 71.74 mL/min (ref >60.00)
Glucose, Bld: 116 mg/dL — ABNORMAL HIGH (ref 70–99)
Potassium: 3.9 mEq/L (ref 3.5–5.1)
Sodium: 144 mEq/L (ref 135–145)
Total Bilirubin: 0.7 mg/dL (ref 0.2–1.2)
Total Protein: 6.7 g/dL (ref 6.0–8.3)

## 2022-10-16 LAB — TSH: TSH: 1.48 u[IU]/mL (ref 0.35–5.50)

## 2022-10-16 LAB — LIPID PANEL
Cholesterol: 140 mg/dL (ref 0–200)
HDL: 57.5 mg/dL (ref >39.00)
LDL Cholesterol: 73 mg/dL (ref 0–99)
NonHDL: 82.66
Total CHOL/HDL Ratio: 2
Triglycerides: 46 mg/dL (ref 0.0–149.0)
VLDL: 9.2 mg/dL (ref 0.0–40.0)

## 2022-10-16 LAB — HEMOGLOBIN A1C: Hgb A1c MFr Bld: 6.7 % — ABNORMAL HIGH (ref 4.6–6.5)

## 2022-10-16 MED ORDER — LORAZEPAM 0.5 MG PO TABS: ORAL_TABLET | ORAL | 0 refills | Status: AC

## 2022-10-16 MED ORDER — RYBELSUS 7 MG PO TABS
ORAL_TABLET | ORAL | 6 refills | Status: DC
Start: 2022-10-16 — End: 2023-04-21

## 2022-10-16 MED ORDER — LOSARTAN POTASSIUM 50 MG PO TABS: 50.0000 mg | ORAL_TABLET | Freq: Every day | ORAL | 3 refills | Status: DC

## 2022-10-16 MED ORDER — VALACYCLOVIR HCL 1 G PO TABS: 1000.0000 mg | ORAL_TABLET | Freq: Two times a day (BID) | ORAL | 3 refills | Status: AC

## 2022-10-16 NOTE — Patient Instructions (Addendum)
Good to see you today . Lab pending   Refilled medication Continue  attnetion to lifestyle intervention healthy eating and exercise .  Weight bearing  exercise for bone health

## 2022-10-16 NOTE — Progress Notes (Signed)
Chief Complaint  Patient presents with   Annual Exam    HPI: Patient  Valerie Gates  67 y.o. comes in today for Preventive Health Care visit  and med check  DM  rybelsys has worked well not checking bg but feels well . ( See past notes se of sglt2 and metformin)  HT on losartan 50 qd controlled HLD  atorva  no concerns Ativan only taking rarely since cause of stress less  Would like rx on hand . Has had eye check early macular  no dm retinopathy No neuro sx  diabetic sx  neuro  To have mammo soon gyne Health Maintenance  Topic Date Due   Medicare Annual Wellness (AWV)  Never done   HEMOGLOBIN A1C  06/18/2022   COVID-19 Vaccine (3 - Mixed Product risk series) 11/01/2022 (Originally 01/06/2020)   INFLUENZA VACCINE  11/30/2022 (Originally 04/01/2022)   Pneumonia Vaccine 27+ Years old (1 of 1 - PCV) 10/17/2023 (Originally 01/19/2022)   Hepatitis C Screening  10/17/2023 (Originally [birth year]20/1974)   HIV Screening  10/17/2023 (Originally 01/20/1972)   Diabetic kidney evaluation - eGFR measurement  12/18/2022   Diabetic kidney evaluation - Urine ACR  12/18/2022   COLONOSCOPY (Pts 45-74yr Insurance coverage will need to be confirmed)  03/11/2023   OPHTHALMOLOGY EXAM  09/03/2023   FOOT EXAM  10/17/2023   MAMMOGRAM  11/16/2023   DEXA SCAN  Completed   Zoster Vaccines- Shingrix  Completed   HPV VACCINES  Aged Out   DTaP/Tdap/Td  Discontinued   PAP SMEAR-Modifier  Discontinued   Health Maintenance Review LIFESTYLE:  Exercise:   yoga  and 4-5 d per week no physical limitations Tobacco/ETS:n Alcohol: ocass Sugar beverages: 3 x per week Sleep: about 6hr Drug use: no HH of 1 2 pets  Work: retired for 4-6 years   helps  with family tranport and obligations     ROS:  GEN/ HEENT: No fever, significant weight changes sweats headaches vision problems hearing changes, CV/ PULM; No chest pain shortness of breath cough, syncope,edema  change in exercise tolerance. GI /GU: No adominal pain,  vomiting, change in bowel habits. No blood in the stool. No significant GU symptoms. SKIN/HEME: ,no acute skin rashes suspicious lesions or bleeding. No lymphadenopathy, nodules, masses.  NEURO/ PSYCH:  No neurologic signs such as weakness numbness. No depression anxiety. IMM/ Allergy: No unusual infections.  Allergy .   REST of 12 system review negative except as per HPI   Past Medical History:  Diagnosis Date   Allergic rhinitis    Allergy    seasonal   Anxiety    Arthritis    lower right back    [place]   Diabetes [place] without complication ([place]    History of skin cancer    Hx of adenomatous polyp of colon 03/17/2018   Hypertension    Osteopenia     Past Surgical History:  Procedure Laterality Date   BLADDER SURGERY     COLONOSCOPY     skin cancer removed on head      Family History  Problem Relation Age of Onset   Colon polyps Mother    Liver disease Sister    Pancreatic cancer Sister    Drug abuse Daughter    Nephrolithiasis Daughter    Diabetes Other    Hypertension Other    Lung cancer Other    Stroke Other        1st degree female <50  Colon cancer Neg Hx    Rectal cancer Neg Hx    Stomach cancer Neg Hx    Esophageal cancer Neg Hx    Breast cancer Neg Hx     Social History   Socioeconomic History   Marital status: Divorced    Spouse name: Not on file   Number of children: Not on file   Years of education: Not on file   Highest education level: Not on file  Occupational History   Not on file  Tobacco Use   Smoking status: Former   Smokeless tobacco: Never   Tobacco comments:    Quit 35 years ago  Vaping Use   Vaping Use: Never used  Substance and Sexual Activity   Alcohol use: No   Drug use: No   Sexual activity: Not on file  Other Topics Concern   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     atorvastatin (LIPITOR) 20 MG tablet Take 1 tablet (20 mg total) by mouth daily at 6 PM. 90 tablet 3   fexofenadine (ALLEGRA) 180 MG tablet Take 180 mg by mouth daily as needed for allergies or rhinitis.      fluticasone (FLONASE) 50 MCG/ACT nasal spray Use 2 spray(s) in each nostril once daily 16 g 0   LORazepam (ATIVAN) 0.5 MG tablet TAKE 1 TABLET (0.5 MG TOTAL) BY MOUTH 2 TIMES DAILY AS NEEDED FOR ANXIETY. CAN TAKE 2 IF NEEDED 30 tablet 0   losartan (COZAAR) 50 MG tablet TAKE 1 TABLET BY MOUTH EVERY DAY 90 tablet 0   RYBELSUS 7 MG TABS TAKE 1 TABLET BY MOUTH EVERY DAY FOR DIABETES 30 tablet 6   valACYclovir (VALTREX) 1000 MG tablet TAKE 1 TABLET (1,000 MG TOTAL) BY MOUTH 2 (TWO) TIMES DAILY. X 3 DAYS FOR OUTBREAKS 20 tablet 3   naproxen (NAPROSYN) 500 MG tablet Take 500 mg by mouth as needed.     Facility-Administered Medications Prior to Visit  Medication Dose Route Frequency Provider Last Rate Last Admin   0.9 %  sodium chloride infusion  500 mL Intravenous Once Gatha Mayer, MD         EXAM:  BP 122/80 (BP Location: Right Arm, Patient Position: Sitting, Cuff Size: Normal)   Pulse 73   Temp 98.2 F (36.8 C) (Oral)   Ht 5' 3.9" (1.623 m)   Wt 138 lb 9.6 oz (62.9 kg)   LMP  (LMP Unknown)   SpO2 98%   BMI 23.87 kg/m   Body mass index is 23.87 kg/m. Wt Readings from Last 3 Encounters:  10/16/22 138 lb 9.6 oz (62.9 kg)  01/01/22 134 lb 6.4 oz (61 kg)  12/17/21 131 lb 9.6 oz (59.7 kg)    Physical Exam: Vital signs reviewed [id number] is a well-developed  well-nourished alert cooperative  who appearsr stated age in no acute distress.  HEENT: normocephalic atraumatic , Eyes: PERRL EOM's full, conjunctiva clear, Nares: paten,t no deformity discharge or tenderness., Ears: no deformity EAC's clear TMs with normal landmarks. Mouth: clear OP, no lesions, edema.  Moist mucous membranes. Dentition in adequate repair. NECK: supple without masses, thyromegaly or bruits. CHEST/PULM:  Clear to auscultation and percussion breath sounds equal no wheeze , rales or rhonchi. No chest wall deformities or tenderness. Breast: normal by inspection . No dimpling, discharge, masses, tenderness or discharge . CV: PMI is nondisplaced, S1 S2 no gallops, murmurs, rubs. Peripheral pulses are full without delay.No JVD .  ABDOMEN: Bowel sounds normal nontender  No guard or rebound, no hepato splenomegal no CVA tenderness.  No hernia. Extremtities:  No clubbing cyanosis or edema, no acute joint swelling or redness no focal atrophy NEURO:  Oriented x3, cranial nerves 3-12 appear to be intact, no obvious focal weakness,gait within normal limits no abnormal reflexes or asymmetrical SKIN: No acute rashes normal turgor, color, no bruising or petechiae. PSYCH: Oriented, good eye contact, no obvious depression anxiety, cognition and judgment appear normal. LN: no cervical axillary inguinal adenopathy Diabetic Foot Exam - Simple   Simple Foot Form Diabetic Foot exam was performed with the following findings: Yes 10/16/2022  9:14 AM  Visual Inspection No deformities, no ulcerations, no other skin breakdown bilaterally: Yes Sensation Testing Intact to touch and monofilament testing bilaterally: Yes Pulse Check Posterior Tibialis and Dorsalis pulse intact bilaterally: Yes Comments      Lab Results  Component Value Date   WBC 5.4 12/17/2021   HGB 13.7 12/17/2021   HCT 40.9 12/17/2021   PLT 221.0 12/17/2021   GLUCOSE 73 12/17/2021   CHOL 146 12/17/2021   TRIG 77.0 12/17/2021    HDL 61.70 12/17/2021   LDLDIRECT 152.9 06/26/2009   LDLCALC 69 12/17/2021   ALT 22 12/17/2021   AST 19 12/17/2021   NA 142 12/17/2021   K 3.8 12/17/2021   CL 105 12/17/2021   CREATININE 0.76 12/17/2021   BUN 21 12/17/2021   CO2 30 12/17/2021   TSH 1.42 12/17/2021   HGBA1C 6.5 12/17/2021   MICROALBUR <0.7 12/17/2021    BP Readings from Last 3 Encounters:  10/16/22 122/80  01/01/22 126/78  12/17/21 110/70    Lab rplanreviewed with patient   ASSESSMENT AND PLAN:  Discussed the following assessment and plan:    ICD-10-CM   1. Visit for preventive health examination  Z00.00     2. Type 2 diabetes mellitus with other specified complication, without long-term current use of insulin (HCC)  E11.69 CBC with Differential/Platelet    Comprehensive metabolic panel    Lipid panel    TSH    Hemoglobin A1c    Microalbumin / creatinine urine ratio    3. Medication management  Z79.899 CBC with Differential/Platelet    Comprehensive metabolic panel    Lipid panel    TSH    Hemoglobin A1c    Microalbumin / creatinine urine ratio    4. Essential hypertension  I10 CBC with Differential/Platelet    Comprehensive metabolic panel    Lipid panel    TSH    Hemoglobin A1c    Microalbumin / creatinine urine ratio    5. Hyperlipidemia, unspecified hyperlipidemia type  E78.5 CBC with Differential/Platelet    Comprehensive metabolic panel    Lipid panel    TSH    Hemoglobin A1c    Microalbumin / creatinine urine ratio  Declines flu pneu vaccine and hiv hep c screen  Over all doing well   lab monitoring  6 mos med check  Return in about 6 months (around 04/16/2023) for medication a1c check .  Patient Care Team: Aaleigha Bozza, Standley Brooking, MD as PCP - General Dorothy Spark, MD as PCP - Cardiology (Cardiology) Lomax, Marny Lowenstein, MD (Inactive) (Obstetrics and Gynecology) Servando Salina, MD as Consulting Physician (Obstetrics and Gynecology) Rolm Bookbinder, MD as Consulting Physician  (Dermatology) Luberta Mutter, MD as Consulting Physician (Ophthalmology) Patient Instructions  Good to see you today . Lab pending   Refilled medication Continue  attnetion to lifestyle intervention healthy eating and exercise .  Weight bearing  exercise for bone health  Mariann Laster K. Casimiro Lienhard M.D.

## 2022-10-20 NOTE — Progress Notes (Signed)
A1c up a tad but still controlled at 6.7  ldl is at goal rest of labs in range  Recheck in 6 months as planned

## 2022-11-25 ENCOUNTER — Ambulatory Visit: Payer: Medicare Other

## 2022-12-12 ENCOUNTER — Ambulatory Visit
Admission: RE | Admit: 2022-12-12 | Discharge: 2022-12-12 | Disposition: A | Discharge status: Home / Self Care | Payer: Medicare Other | Source: Ambulatory Visit | Attending: Obstetrics and Gynecology | Admitting: Obstetrics and Gynecology

## 2022-12-12 DIAGNOSIS — Z1231 Encounter for screening mammogram for malignant neoplasm of breast: Secondary | ICD-10-CM

## 2023-01-02 ENCOUNTER — Ambulatory Visit: Payer: Medicare Other | Admitting: Cardiology

## 2023-01-08 ENCOUNTER — Telehealth: Payer: Self-pay | Admitting: Internal Medicine

## 2023-01-08 NOTE — Telephone Encounter (Signed)
Contacted Zachery Conch to schedule their annual wellness visit. Appointment made for 01/15/23.  Rudell Cobb AWV direct phone # [phone]

## 2023-01-15 ENCOUNTER — Other Ambulatory Visit: Payer: Self-pay | Admitting: *Deleted

## 2023-01-15 ENCOUNTER — Ambulatory Visit (INDEPENDENT_AMBULATORY_CARE_PROVIDER_SITE_OTHER): Payer: Medicare Other | Admitting: Family Medicine

## 2023-01-15 ENCOUNTER — Encounter: Payer: Self-pay | Admitting: Family Medicine

## 2023-01-15 VITALS — Ht 63.9 in | Wt 138.0 lb

## 2023-01-15 DIAGNOSIS — E785 Hyperlipidemia, unspecified: Secondary | ICD-10-CM

## 2023-01-15 DIAGNOSIS — Z0000 Encounter for general adult medical examination without abnormal findings: Secondary | ICD-10-CM

## 2023-01-15 DIAGNOSIS — R011 Cardiac murmur, unspecified: Secondary | ICD-10-CM

## 2023-01-15 MED ORDER — ATORVASTATIN CALCIUM 20 MG PO TABS: 20.0000 mg | ORAL_TABLET | Freq: Every day | ORAL | 0 refills | Status: DC

## 2023-01-15 NOTE — Progress Notes (Signed)
PATIENT CHECK-IN and HEALTH RISK ASSESSMENT QUESTIONNAIRE:  -completed by phone/video for upcoming Medicare Preventive Visit  Pre-Visit Check-in: 1)Vitals (height, wt, BP, etc) - record in vitals section for visit on day of visit 2)Review and Update Medications, Allergies PMH, Surgeries, Social history in Epic 3)Hospitalizations in the last year with date/reason? No  4)Review and Update Care Team (patient's specialists) in Epic 5) Complete PHQ9 in Epic  6) Complete Fall Screening in Epic 7)Review all Health Maintenance Due and order under PCP if not done.  8)Medicare Wellness Questionnaire: Answer theses question about your habits: Do you drink alcohol? yes If yes, how many drinks do you have a day? 1 or 2 Have you ever smoked?yes Quit date if applicable? 1987  How many packs a day do/did you smoke? 1 pk Do you use smokeless tobacco?no Do you use an illicit drugs?no Do you exercises? Yes try to every day for at least 30 minutes - walks daily and was doing yoga - plans to start yoga back up Are you sexually active? Pt declined Typical breakfast: varies- cereal, pancake, bagels Typical lunch: varies- fish, chicken,  Typical dinner: same thing with lunch.  Typical snacks: occasionally- apple, orange, watermelon, fruits.   Beverages: water, diet lemonade, sometimes do juices.   Answer theses question about you: Can you perform most household chores?yes Do you find it hard to follow a conversation in a noisy room?no Do you often ask people to speak up or repeat themselves? Occasionally  Do you feel that you have a problem with memory?no Do you balance your checkbook and or bank acounts?yes Do you feel safe at home?yes Last dentist visit?early this year. Due in October.  Do you need assistance with any of the following:  No   Feeding yourself?  Getting from bed to chair?  Getting to the toilet?  Bathing or showering?  Dressing yourself?  Managing money?  Climbing a flight of  stairs  Preparing meals?  Do you have Advanced Directives in place (Living Will, Healthcare Power or Attorney)? Yes   Last eye Exam and location? January 2024 at [facility] ophthalmology.    Do you currently use prescribed or non-prescribed narcotic or opioid pain medications?no  Do you have a history or close family history of breast, ovarian, tubal or peritoneal cancer or a family member with BRCA (breast cancer susceptibility 1 and 2) gene mutations? No  Nurse/Assistant Credentials/time stamp: Karpuih M/CMA/3:53pm   ----------------------------------------------------------------------------------------------------------------------------------------------------------------------------------------------------------------------   MEDICARE ANNUAL PREVENTIVE VISIT WITH PROVIDER: (Welcome to [employer], initial annual wellness or annual wellness exam)  Virtual Visit via Video Note  I connected with Valerie Gates on 01/20/23 by a video enabled telemedicine application and verified that I am speaking with the correct person using two identifiers.  Location patient: home Location provider:work or home office Persons participating in the virtual visit: patient, provider  Concerns and/or follow up today: none, no concerns   See HM section in Epic for other details of completed HM.    ROS: negative for report of fevers, unintentional weight loss, vision changes, vision loss, hearing loss or change, chest pain, sob, hemoptysis, melena, hematochezia, hematuria, falls, bleeding or bruising, thoughts of suicide or self harm, memory loss  Patient-completed extensive health risk assessment - reviewed and discussed with the patient: See Health Risk Assessment completed with patient prior to the visit either above or in recent phone note. This was reviewed in detailed with the patient today and appropriate recommendations, orders and referrals were placed as needed per Summary below  and patient  instructions.   Review of Medical History: -PMH, PSH, Family History and current specialty and care providers reviewed and updated and listed below   Patient Care Team: Panosh, Neta Mends, MD as PCP - General Lars Masson, MD as PCP - Cardiology (Cardiology) Lomax, Billey Chang, MD (Inactive) (Obstetrics and Gynecology) Maxie Better, MD as Consulting Physician (Obstetrics and Gynecology) Venancio Poisson, MD as Consulting Physician (Dermatology) Maris Berger, MD as Consulting Physician (Ophthalmology)   Past Medical History:  Diagnosis Date   Allergic rhinitis    Allergy    seasonal   Anxiety    Arthritis    lower right back    Cancer [facility])    skin cancer-scalp   Diabetes mellitus without complication (HCC)    History of skin cancer    Hx of adenomatous polyp of colon 03/17/2018   Hypertension    Osteopenia     Past Surgical History:  Procedure Laterality Date   BLADDER SURGERY     COLONOSCOPY     skin cancer removed on head      Social History   Socioeconomic History   Marital status: Divorced    Spouse name: Not on file   Number of children: Not on file   Years of education: Not on file   Highest education level: Not on file  Occupational History   Not on file  Tobacco Use   Smoking status: Former   Smokeless tobacco: Never   Tobacco comments:    Quit 35 years ago  Vaping Use   Vaping Use: Never used  Substance and Sexual Activity   Alcohol use: No   Drug use: No   Sexual activity: Not on file  Other Topics Concern   Not on file  Social History Narrative   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   Has bf    Regular exercise- yes   HH of 2   1pet dogs   Daughter SUD hx  IVDU and incarderation     Social Determinants of Health   Financial Resource Strain: Not on file  Food Insecurity: Not on file  Transportation Needs: Not on file  Physical Activity: Sufficiently Active (01/15/2023)   Exercise Vital Sign    Days  of Exercise per Week: 7 days    Minutes of Exercise per Session: 30 min  Stress: Not on file  Social Connections: Not on file  Intimate Partner Violence: Not on file    Family History  Problem Relation Age of Onset   Colon polyps Mother    Liver disease Sister    Pancreatic cancer Sister    Drug abuse Daughter    Nephrolithiasis Daughter    Diabetes Other    Hypertension Other    Lung cancer Other    Stroke Other        1st degree female <50   Colon cancer Neg Hx    Rectal cancer Neg Hx    Stomach cancer Neg Hx    Esophageal cancer Neg Hx    Breast cancer Neg Hx     Current Outpatient Medications on File Prior to Visit  Medication Sig Dispense Refill   aspirin EC 81 MG tablet Take 1 tablet (81 mg total) by mouth daily.     fexofenadine (ALLEGRA) 180 MG tablet Take 180 mg by mouth daily as needed for allergies or rhinitis.      fluticasone (FLONASE) 50 MCG/ACT nasal spray Use 2 spray(s) in  each nostril once daily 16 g 0   LORazepam (ATIVAN) 0.5 MG tablet TAKE 1 TABLET (0.5 MG TOTAL) BY MOUTH 2 TIMES DAILY AS NEEDED FOR ANXIETY. CAN TAKE 2 IF NEEDED 30 tablet 0   losartan (COZAAR) 50 MG tablet Take 1 tablet (50 mg total) by mouth daily. 90 tablet 3   Semaglutide (RYBELSUS) 7 MG TABS TAKE 1 TABLET BY MOUTH EVERY DAY FOR DIABETES 30 tablet 6   valACYclovir (VALTREX) 1000 MG tablet Take 1 tablet (1,000 mg total) by mouth 2 (two) times daily. X 3 days for outbreaks 20 tablet 3   Current Facility-Administered Medications on File Prior to Visit  Medication Dose Route Frequency Provider Last Rate Last Admin   0.9 %  sodium chloride infusion  500 mL Intravenous Once Iva Boop, MD        Allergies  Allergen Reactions   Jardiance [Empagliflozin]     Causes yeast infection per patient   Metformin Diarrhea   Metformin And Related Diarrhea       Physical Exam There were no vitals filed for this visit. Estimated body mass index is 23.76 kg/m as calculated from the  following:   Height as of this encounter: 5' 3.9" (1.623 m).   Weight as of this encounter: 138 lb (62.6 kg).  EKG (optional): deferred due to virtual visit  GENERAL: alert, oriented, no acute distress detected, full vision exam deferred due to pandemic and/or virtual encounter  HEENT: atraumatic, conjunttiva clear, no obvious abnormalities on inspection of external nose and ears  NECK: normal movements of the head and neck  LUNGS: on inspection no signs of respiratory distress, breathing rate appears normal, no obvious gross SOB, gasping or wheezing  CV: no obvious cyanosis  MS: moves all visible extremities without noticeable abnormality  PSYCH/NEURO: pleasant and cooperative, no obvious depression or anxiety, speech and thought processing grossly intact, Cognitive function grossly intact  Flowsheet Row Office Visit from 10/16/2022 in [facility] [place]  PHQ-9 Total Score 0           10/16/2022    8:48 AM 12/17/2021   10:03 AM 12/17/2021    9:47 AM 04/22/2021    9:43 AM 11/08/2018   10:24 AM  Depression screen PHQ 2/9  Decreased Interest 0 0 0 0 0  Down, Depressed, Hopeless 0 0 0 0 0  PHQ - 2 Score 0 0 0 0 0  Altered sleeping 0 0 0 0   Tired, decreased energy 0 0 0 0   Change in appetite 0 0 0 0   Feeling bad or failure about yourself  0 0 0 0   Trouble concentrating 0 0 0 0   Moving slowly or fidgety/restless 0 0 0 0   Suicidal thoughts 0  0 0   PHQ-9 Score 0 0 0 0   Difficult doing work/chores  Not difficult at all Not difficult at all         12/17/2021    9:47 AM 10/16/2022    8:47 AM  Fall Risk  Falls in the past year? 0 0  Was there an injury with Fall? 0 0  Fall Risk Category Calculator 0 0  Fall Risk Category (Retired) Low   (RETIRED) Patient Fall Risk Level Low fall risk   Patient at Risk for Falls Due to No Fall Risks No Fall Risks  Fall risk Follow up Falls evaluation completed Falls evaluation completed     SUMMARY AND  PLAN:  Encounter for Medicare annual wellness exam   Discussed applicable health maintenance/preventive health measures and advised and referred or ordered per patient preferences: -we went over each measure, recommendations and she did not have any questions  Health Maintenance  Topic Date Due   COVID-19 Vaccine (3 - Mixed Product risk series) 01/31/2023 (Originally 01/06/2020)   Pneumonia Vaccine 36+ Years old (1 of 2 - PCV) 10/17/2023 (Originally 01/20/1962)   Hepatitis C Screening  10/17/2023 (Originally 01/20/1974)   COLONOSCOPY (Pts 45-59yrs Insurance coverage will need to be confirmed)  03/11/2023   INFLUENZA VACCINE  04/02/2023   HEMOGLOBIN A1C  04/16/2023   OPHTHALMOLOGY EXAM  09/03/2023   Diabetic kidney evaluation - eGFR measurement  10/17/2023   Diabetic kidney evaluation - Urine ACR  10/17/2023   FOOT EXAM  10/17/2023   Medicare Annual Wellness (AWV)  01/15/2024   MAMMOGRAM  12/11/2024   DEXA SCAN  Completed   Zoster Vaccines- Shingrix  Completed   HPV VACCINES  Aged Out   DTaP/Tdap/Td  Discontinued     Education and counseling on the following was provided based on the above review of health and a plan/checklist for the patient, along with additional information discussed, was provided for the patient in the patient instructions :   -Advised and counseled on a healthy lifestyle - including the importance of a healthy diet, regular physical activity -Reviewed patient's current diet. Advised and counseled on a whole foods based healthy diet. A summary of a healthy diet was provided in the Patient Instructions.  -reviewed patient's current physical activity level and discussed exercise guidelines for adults. -Advise yearly dental visits at minimum and regular eye exams -Advise no more than one alcoholic beverage per 24 hour period  Follow up: see patient instructions     Patient Instructions  I really enjoyed getting to talk with you today! I am available on Tuesdays  and Thursdays for virtual visits if you have any questions or concerns, or if I can be of any further assistance.   CHECKLIST FROM ANNUAL WELLNESS VISIT:  -Follow up (please call to schedule if not scheduled after visit):   -yearly for annual wellness visit with primary care office  Here is a list of your preventive care/health maintenance measures:    Health Maintenance  Topic Date Due   COVID-19 Vaccine (3 - Mixed Product risk series) due   Pneumonia Vaccine 20+ Years old (1 of 2 - PCV) due   Hepatitis C Screening  due   HIV Screening  due   COLONOSCOPY (Pts 45-35yrs Insurance coverage will need to be confirmed)  03/11/2023   INFLUENZA VACCINE  04/02/2023   HEMOGLOBIN A1C  04/16/2023   OPHTHALMOLOGY EXAM  09/03/2023   Diabetic kidney evaluation - eGFR measurement  10/17/2023   Diabetic kidney evaluation - Urine ACR  10/17/2023   FOOT EXAM  10/17/2023   Medicare Annual Wellness (AWV)  01/15/2024   MAMMOGRAM  12/11/2024   DEXA SCAN  Completed   Zoster Vaccines- Shingrix  Completed   HPV VACCINES  Aged Out   DTaP/Tdap/Td  Discontinued   PAP SMEAR-Modifier  Discontinued    -See a dentist at least yearly  -Get your eyes checked and then per your eye specialist's recommendations  -advise no more than one alcoholic beverage per 24 hour period for women and no more than 2 alcoholic beverages per 24 hour period for men.  -I have included below further information regarding a healthy whole foods based diet, physical activity guidelines for  adults, stress management and opportunities for social connections. I hope you find this information useful.   -----------------------------------------------------------------------------------------------------------------------------------------------------------------------------------------------------------------------------------------------------------  NUTRITION: -eat real food: lots of colorful vegetables (half the plate) and  fruits -5-7 servings of vegetables and fruits per day (fresh or steamed is best), exp. 2 servings of vegetables with lunch and dinner and 2 servings of fruit per day. Berries and greens such as kale and collards are great choices.  -consume on a regular basis: whole grains (make sure first ingredient on label contains the word "whole"), fresh fruits, fish, nuts, seeds, healthy oils (such as olive oil, avocado oil, grape seed oil) -may eat small amounts of dairy and lean meat on occasion, but avoid processed meats such as ham, bacon, lunch meat, etc. -drink water -try to avoid fast food and pre-packaged foods, processed meat -most experts advise limiting sodium to < 2300mg  per day, should limit further is any chronic conditions such as high blood pressure, heart disease, diabetes, etc. The American Heart Association advised that < 1500mg  is is ideal -try to avoid foods that contain any ingredients with names you do not recognize  -try to avoid sugar/sweets (except for the natural sugar that occurs in fresh fruit) -try to avoid sweet drinks -try to avoid white rice, white bread, pasta (unless whole grain), white or yellow potatoes  EXERCISE GUIDELINES FOR ADULTS: -if you wish to increase your physical activity, do so gradually and with the approval of your doctor -STOP and seek medical care immediately if you have any chest pain, chest discomfort or trouble breathing when starting or increasing exercise  -move and stretch your body, legs, feet and arms when sitting for long periods -Physical activity guidelines for optimal health in adults: -least 150 minutes per week of aerobic exercise (can talk, but not sing) once approved by your doctor, 20-30 minutes of sustained activity or two 10 minute episodes of sustained activity every day.  -resistance training at least 2 days per week if approved by your doctor -balance exercises 3+ days per week:   Stand somewhere where you have something sturdy to  hold onto if you lose balance.    1) lift up on toes, start with 5x per day and work up to 20x   2) stand and lift on leg straight out to the side so that foot is a few inches of the floor, start with 5x each side and work up to 20x each side   3) stand on one foot, start with 5 seconds each side and work up to 20 seconds on each side  If you need ideas or help with getting more active:  -Silver sneakers https://tools.silversneakers.com  -Walk with a Doc: http://www.duncan-williams.com/  -try to include resistance (weight lifting/strength building) and balance exercises twice per week: or the following link for ideas: http://castillo-powell.com/  BuyDucts.dk  STRESS MANAGEMENT: -can try meditating, or just sitting quietly with deep breathing while intentionally relaxing all parts of your body for 5 minutes daily -if you need further help with stress, anxiety or depression please follow up with your primary doctor or contact the wonderful folks at [facility]: [phone]  SOCIAL CONNECTIONS: -options in [place] if you wish to engage in more social and exercise related activities:  -Silver sneakers https://tools.silversneakers.com  -Walk with a Doc: http://www.duncan-williams.com/  -Check out the [facility] Active Adults 50+ section on the [place] of [employer] (hiking clubs, book clubs, cards and games, chess, exercise classes, aquatic classes and much more) - see the website for details:  https://www.[place].gov/departments/parks-recreation/active-adults50  -YouTube has lots of exercise videos for different ages and abilities as well  -[facility] (a variety of indoor and outdoor inperson activities for adults). [phone]) [phone]. [address].  -Virtual Online Classes (a variety of topics): see seniorplanet.org or call (430) 050-9685  -consider volunteering  at a school, hospice center, church, senior center or elsewhere           Terressa Koyanagi, DO

## 2023-01-15 NOTE — Patient Instructions (Addendum)
I really enjoyed getting to talk with you today! I am available on Tuesdays and Thursdays for virtual visits if you have any questions or concerns, or if I can be of any further assistance.   CHECKLIST FROM ANNUAL WELLNESS VISIT:  -Follow up (please call to schedule if not scheduled after visit):   -yearly for annual wellness visit with primary care office  Here is a list of your preventive care/health maintenance measures:    Health Maintenance  Topic Date Due   COVID-19 Vaccine (3 - Mixed Product risk series) due   Pneumonia Vaccine 22+ Years old (1 of 2 - PCV) due   Hepatitis C Screening  due   HIV Screening  due   COLONOSCOPY (Pts 45-37yrs Insurance coverage will need to be confirmed)  03/11/2023   INFLUENZA VACCINE  04/02/2023   HEMOGLOBIN A1C  04/16/2023   OPHTHALMOLOGY EXAM  09/03/2023   Diabetic kidney evaluation - eGFR measurement  10/17/2023   Diabetic kidney evaluation - Urine ACR  10/17/2023   FOOT EXAM  10/17/2023   Medicare Annual Wellness (AWV)  01/15/2024   MAMMOGRAM  12/11/2024   DEXA SCAN  Completed   Zoster Vaccines- Shingrix  Completed   HPV VACCINES  Aged Out   DTaP/Tdap/Td  Discontinued   PAP SMEAR-Modifier  Discontinued    -See a dentist at least yearly  -Get your eyes checked and then per your eye specialist's recommendations  -advise no more than one alcoholic beverage per 24 hour period for women and no more than 2 alcoholic beverages per 24 hour period for men.  -I have included below further information regarding a healthy whole foods based diet, physical activity guidelines for adults, stress management and opportunities for social connections. I hope you find this information useful.    -----------------------------------------------------------------------------------------------------------------------------------------------------------------------------------------------------------------------------------------------------------  NUTRITION: -eat real food: lots of colorful vegetables (half the plate) and fruits -5-7 servings of vegetables and fruits per day (fresh or steamed is best), exp. 2 servings of vegetables with lunch and dinner and 2 servings of fruit per day. Berries and greens such as kale and collards are great choices.  -consume on a regular basis: whole grains (make sure first ingredient on label contains the word "whole"), fresh fruits, fish, nuts, seeds, healthy oils (such as olive oil, avocado oil, grape seed oil) -may eat small amounts of dairy and lean meat on occasion, but avoid processed meats such as ham, bacon, lunch meat, etc. -drink water -try to avoid fast food and pre-packaged foods, processed meat -most experts advise limiting sodium to < 2300mg  per day, should limit further is any chronic conditions such as high blood pressure, heart disease, diabetes, etc. The American Heart Association advised that < 1500mg  is is ideal -try to avoid foods that contain any ingredients with names you do not recognize  -try to avoid sugar/sweets (except for the natural sugar that occurs in fresh fruit) -try to avoid sweet drinks -try to avoid white rice, white bread, pasta (unless whole grain), white or yellow potatoes  EXERCISE GUIDELINES FOR ADULTS: -if you wish to increase your physical activity, do so gradually and with the approval of your doctor -STOP and seek medical care immediately if you have any chest pain, chest discomfort or trouble breathing when starting or increasing exercise  -move and stretch your body, legs, feet and arms when sitting for long periods -Physical activity guidelines for optimal health in adults: -least 150 minutes per week of  aerobic exercise (can talk, but not sing) once  approved by your doctor, 20-30 minutes of sustained activity or two 10 minute episodes of sustained activity every day.  -resistance training at least 2 days per week if approved by your doctor -balance exercises 3+ days per week:   Stand somewhere where you have something sturdy to hold onto if you lose balance.    1) lift up on toes, start with 5x per day and work up to 20x   2) stand and lift on leg straight out to the side so that foot is a few inches of the floor, start with 5x each side and work up to 20x each side   3) stand on one foot, start with 5 seconds each side and work up to 20 seconds on each side  If you need ideas or help with getting more active:  -Silver sneakers https://tools.silversneakers.com  -Walk with a Doc: http://www.duncan-williams.com/  -try to include resistance (weight lifting/strength building) and balance exercises twice per week: or the following link for ideas: http://castillo-powell.com/  BuyDucts.dk  STRESS MANAGEMENT: -can try meditating, or just sitting quietly with deep breathing while intentionally relaxing all parts of your body for 5 minutes daily -if you need further help with stress, anxiety or depression please follow up with your primary doctor or contact the wonderful folks at [facility]: [phone]  SOCIAL CONNECTIONS: -options in [place] if you wish to engage in more social and exercise related activities:  -Silver sneakers https://tools.silversneakers.com  -Walk with a Doc: http://www.duncan-williams.com/  -Check out the [facility] Active Adults 50+ section on the [place] of [employer] (hiking clubs, book clubs, cards and games, chess, exercise classes, aquatic classes and much more) - see the website for  details: https://www.[place]-.gov/departments/parks-recreation/active-adults50  -YouTube has lots of exercise videos for different ages and abilities as well  -[facility] (a variety of indoor and outdoor inperson activities for adults). [phone]. [address]  -Virtual Online Classes (a variety of topics): see seniorplanet.org or call 531-395-4379  -consider volunteering at a school, hospice center, church, senior center or elsewhere

## 2023-02-11 ENCOUNTER — Other Ambulatory Visit: Payer: Self-pay

## 2023-02-11 DIAGNOSIS — E785 Hyperlipidemia, unspecified: Secondary | ICD-10-CM

## 2023-02-11 DIAGNOSIS — R011 Cardiac murmur, unspecified: Secondary | ICD-10-CM

## 2023-02-11 MED ORDER — ATORVASTATIN CALCIUM 20 MG PO TABS: 20.0000 mg | ORAL_TABLET | Freq: Every day | ORAL | 0 refills | Status: DC

## 2023-02-18 NOTE — Progress Notes (Signed)
  Cardiology Office Note:  .   Date:  02/18/2023  ID:  Valerie Gates, DOB 24-Aug-1956, MRN [id number] PCP: Madelin Headings, MD  [facility] Providers Cardiologist:  Tobias Alexander, MD {  History of Present Illness: .   Valerie Gates is a 67 y.o. female with a past medical history of type 2 diabetes mellitus, HLD, and hypertension who presents to clinic for follow-up appointment.  Initially seen by Dr. Delton See after abdominal CT for diagnosis of kidney stone and had an incidental finding of calcification of her abdominal aorta.  CT reviewed and there is about a black in the abdominal aorta.  No calcium seen in the coronary arteries however the most proximal portions were not visualized.  Seen in the clinic 11/27/2020 and was doing well at that time.  He lost 15 pounds since changing her diabetic medications.  Was taken off of blood pressure medications after weight loss.  Was seen by Dr. Shari Prows 01/01/2022 that time was feeling good.  No chest pain or shortness of breath.  No lower extremity edema.  She was restarted on antihypertensives.  She attributed this to increased life stressors.  Currently on valsartan, atorvastatin, and aspirin 81 mg daily.  A1c 6.5.  Denied palpitations and chest pain.  No shortness of breath.  No lightheadedness, headaches, syncope, orthopnea, or PND.  Today, she has been doing really well without any new symptoms.  No shortness of breath, chest pain, swelling.  She is doing some walking but has been off of her routine due to vacation.  We discussed low-sodium diet.  Her primary care follows her cholesterol level.  Reports no shortness of breath nor dyspnea on exertion. Reports no chest pain, pressure, or tightness. No edema, orthopnea, PND. Reports no palpitations.     ROS: Pertinent ROS is in HPI  Studies Reviewed: .        Echocardiogram with normal LVEF, no significant valve disease      Physical Exam:   VS:  LMP  (LMP Unknown)    Wt Readings from  Last 3 Encounters:  01/15/23 138 lb (62.6 kg)  10/16/22 138 lb 9.6 oz (62.9 kg)  01/01/22 134 lb 6.4 oz (61 kg)    GEN: Well nourished, well developed in no acute distress NECK: No JVD; No carotid bruits CARDIAC: RRR, soft systolic murmur, rubs, gallops RESPIRATORY:  Clear to auscultation without rales, wheezing or rhonchi  ABDOMEN: Soft, non-tender, non-distended EXTREMITIES:  No edema; No deformity   ASSESSMENT AND PLAN: .   1.  Aortic atherosclerosis/HLD -Last LDL was 73, at goal -Continue current medications which include Lipitor 20 mg daily -Continue aspirin 81 mg daily  2.  Hypertension -Continue losartan 50 mg daily -Blood pressure is well-controlled today -Encouraged low-sodium, heart healthy diet  3.  Murmur -echo reviewed with her today -no valvular disease  4.  Type 2 diabetes mellitus -A1C 6.7      Dispo: She can follow-up in 1 year with Dr. Jacques Navy   Signed, Sharlene Dory, PA-C

## 2023-02-20 ENCOUNTER — Encounter: Payer: Self-pay | Admitting: Physician Assistant

## 2023-02-20 ENCOUNTER — Ambulatory Visit: Payer: Medicare Other | Attending: Cardiology | Admitting: Physician Assistant

## 2023-02-20 VITALS — BP 120/84 | HR 76 | Ht 64.5 in | Wt 139.2 lb

## 2023-02-20 DIAGNOSIS — E119 Type 2 diabetes mellitus without complications: Secondary | ICD-10-CM

## 2023-02-20 DIAGNOSIS — I700 Atherosclerosis of aorta: Secondary | ICD-10-CM

## 2023-02-20 DIAGNOSIS — Z7985 Long-term (current) use of injectable non-insulin antidiabetic drugs: Secondary | ICD-10-CM

## 2023-02-20 DIAGNOSIS — R011 Cardiac murmur, unspecified: Secondary | ICD-10-CM | POA: Diagnosis not present

## 2023-02-20 DIAGNOSIS — E785 Hyperlipidemia, unspecified: Secondary | ICD-10-CM | POA: Diagnosis not present

## 2023-02-20 NOTE — Patient Instructions (Signed)
Medication Instructions:  Your physician recommends that you continue on your current medications as directed. Please refer to the Current Medication list given to you today.  *If you need a refill on your cardiac medications before your next appointment, please call your pharmacy*  Lab Work: None ordered If you have labs (blood work) drawn today and your tests are completely normal, you will receive your results only by: MyChart Message (if you have MyChart) OR A paper copy in the mail If you have any lab test that is abnormal or we need to change your treatment, we will call you to review the results.  Follow-Up: At [facility], you and your health needs are our priority.  As part of our continuing mission to provide you with exceptional heart care, we have created designated Provider Care Teams.  These Care Teams include your primary Cardiologist (physician) and Advanced Practice Providers (APPs -  Physician Assistants and Nurse Practitioners) who all work together to provide you with the care you need, when you need it.  Your next appointment:   1 year(s)  Provider:   Dr Jacques Navy  Low-Sodium Eating Plan Salt (sodium) helps you keep a healthy balance of fluids in your body. Too much sodium can raise your blood pressure. It can also cause fluid and waste to be held in your body. Your health care provider or dietitian may recommend a low-sodium eating plan if you have high blood pressure (hypertension), kidney disease, liver disease, or heart failure. Eating less sodium can help lower your blood pressure and reduce swelling. It can also protect your heart, liver, and kidneys. What are tips for following this plan? Reading food labels  Check food labels for the amount of sodium per serving. If you eat more than one serving, you must multiply the listed amount by the number of servings. Choose foods with less than 140 milligrams (mg) of sodium per serving. Avoid foods with 300 mg  of sodium or more per serving. Always check how much sodium is in a product, even if the label says "unsalted" or "no salt added." Shopping  Buy products labeled as "low-sodium" or "no salt added." Buy fresh foods. Avoid canned foods and pre-made or frozen meals. Avoid canned, cured, or processed meats. Buy breads that have less than 80 mg of sodium per slice. Cooking  Eat more home-cooked food. Try to eat less restaurant, buffet, and fast food. Try not to add salt when you cook. Use salt-free seasonings or herbs instead of table salt or sea salt. Check with your provider or pharmacist before using salt substitutes. Cook with plant-based oils, such as canola, sunflower, or olive oil. Meal planning When eating at a restaurant, ask if your food can be made with less salt or no salt. Avoid dishes labeled as brined, pickled, cured, or smoked. Avoid dishes made with soy sauce, miso, or teriyaki sauce. Avoid foods that have monosodium glutamate (MSG) in them. MSG may be added to some restaurant food, sauces, soups, bouillon, and canned foods. Make meals that can be grilled, baked, poached, roasted, or steamed. These are often made with less sodium. General information Try to limit your sodium intake to 1,500-2,300 mg each day, or the amount told by your provider. What foods should I eat? Fruits Fresh, frozen, or canned fruit. Fruit juice. Vegetables Fresh or frozen vegetables. "No salt added" canned vegetables. "No salt added" tomato sauce and paste. Low-sodium or reduced-sodium tomato and vegetable juice. Grains Low-sodium cereals, such as oats, puffed  wheat and rice, and shredded wheat. Low-sodium crackers. Unsalted rice. Unsalted pasta. Low-sodium bread. Whole grain breads and whole grain pasta. Meats and other proteins Fresh or frozen meat, poultry, seafood, and fish. These should have no added salt. Low-sodium canned tuna and salmon. Unsalted nuts. Dried peas, beans, and lentils without  added salt. Unsalted canned beans. Eggs. Unsalted nut butters. Dairy Milk. Soy milk. Cheese that is naturally low in sodium, such as ricotta cheese, fresh mozzarella, or Swiss cheese. Low-sodium or reduced-sodium cheese. Cream cheese. Yogurt. Seasonings and condiments Fresh and dried herbs and spices. Salt-free seasonings. Low-sodium mustard and ketchup. Sodium-free salad dressing. Sodium-free light mayonnaise. Fresh or refrigerated horseradish. Lemon juice. Vinegar. Other foods Homemade, reduced-sodium, or low-sodium soups. Unsalted popcorn and pretzels. Low-salt or salt-free chips. The items listed above may not be all the foods and drinks you can have. Talk to a dietitian to learn more. What foods should I avoid? Vegetables Sauerkraut, pickled vegetables, and relishes. Olives. [place] fries. Onion rings. Regular canned vegetables, except low-sodium or reduced-sodium items. Regular canned tomato sauce and paste. Regular tomato and vegetable juice. Frozen vegetables in sauces. Grains Instant hot cereals. Bread stuffing, pancake, and biscuit mixes. Croutons. Seasoned rice or pasta mixes. Noodle soup cups. Boxed or frozen macaroni and cheese. Regular salted crackers. Self-rising flour. Meats and other proteins Meat or fish that is salted, canned, smoked, spiced, or pickled. Precooked or cured meat, such as sausages or meat loaves. Tomasa Blase. Ham. Pepperoni. Hot dogs. Corned beef. Chipped beef. Salt pork. Jerky. Pickled herring, anchovies, and sardines. Regular canned tuna. Salted nuts. Dairy Processed cheese and cheese spreads. Hard cheeses. Cheese curds. Blue cheese. Feta cheese. String cheese. Regular cottage cheese. Buttermilk. Canned milk. Fats and oils Salted butter. Regular margarine. Ghee. Bacon fat. Seasonings and condiments Onion salt, garlic salt, seasoned salt, table salt, and sea salt. Canned and packaged gravies. Worcestershire sauce. Tartar sauce. Barbecue sauce. Teriyaki sauce. Soy  sauce, including reduced-sodium soy sauce. Steak sauce. Fish sauce. Oyster sauce. Cocktail sauce. Horseradish that you find on the shelf. Regular ketchup and mustard. Meat flavorings and tenderizers. Bouillon cubes. Hot sauce. Pre-made or packaged marinades. Pre-made or packaged taco seasonings. Relishes. Regular salad dressings. Salsa. Other foods Salted popcorn and pretzels. Corn chips and puffs. Potato and tortilla chips. Canned or dried soups. Pizza. Frozen entrees and pot pies. The items listed above may not be all the foods and drinks you should avoid. Talk to a dietitian to learn more. This information is not intended to replace advice given to you by your health care provider. Make sure you discuss any questions you have with your health care provider. Document Revised: 09/04/2022 Document Reviewed: 09/04/2022 Elsevier Patient Education  2024 Else[employer] Inc.  Heart-Healthy Eating Plan Many factors influence your heart health, including eating and exercise habits. Heart health is also called coronary health. Coronary risk increases with abnormal blood fat (lipid) levels. A heart-healthy eating plan includes limiting unhealthy fats, increasing healthy fats, limiting salt (sodium) intake, and making other diet and lifestyle changes. What is my plan? Your health care provider may recommend that: You limit your fat intake to _________% or less of your total calories each day. You limit your saturated fat intake to _________% or less of your total calories each day. You limit the amount of cholesterol in your diet to less than _________ mg per day. You limit the amount of sodium in your diet to less than _________ mg per day. What are tips for following this plan? Cooking [employer]  foods using methods other than frying. Baking, boiling, grilling, and broiling are all good options. Other ways to reduce fat include: Removing the skin from poultry. Removing all visible fats from meats. Steaming  vegetables in water or broth. Meal planning  At meals, imagine dividing your plate into fourths: Fill one-half of your plate with vegetables and green salads. Fill one-fourth of your plate with whole grains. Fill one-fourth of your plate with lean protein foods. Eat 2-4 cups of vegetables per day. One cup of vegetables equals 1 cup (91 g) broccoli or cauliflower florets, 2 medium carrots, 1 large bell pepper, 1 large sweet potato, 1 large tomato, 1 medium white potato, 2 cups (150 g) raw leafy greens. Eat 1-2 cups of fruit per day. One cup of fruit equals 1 small apple, 1 large banana, 1 cup (237 g) mixed fruit, 1 large orange,  cup (82 g) dried fruit, 1 cup (240 mL) 100% fruit juice. Eat more foods that contain soluble fiber. Examples include apples, broccoli, carrots, beans, peas, and barley. Aim to get 25-30 g of fiber per day. Increase your consumption of legumes, nuts, and seeds to 4-5 servings per week. One serving of dried beans or legumes equals  cup (90 g) cooked, 1 serving of nuts is  oz (12 almonds, 24 pistachios, or 7 walnut halves), and 1 serving of seeds equals  oz (8 g). Fats Choose healthy fats more often. Choose monounsaturated and polyunsaturated fats, such as olive and canola oils, avocado oil, flaxseeds, walnuts, almonds, and seeds. Eat more omega-3 fats. Choose salmon, mackerel, sardines, tuna, flaxseed oil, and ground flaxseeds. Aim to eat fish at least 2 times each week. Check food labels carefully to identify foods with trans fats or high amounts of saturated fat. Limit saturated fats. These are found in animal products, such as meats, butter, and cream. Plant sources of saturated fats include palm oil, palm kernel oil, and coconut oil. Avoid foods with partially hydrogenated oils in them. These contain trans fats. Examples are stick margarine, some tub margarines, cookies, crackers, and other baked goods. Avoid fried foods. General information Eat more home-cooked  food and less restaurant, buffet, and fast food. Limit or avoid alcohol. Limit foods that are high in added sugar and simple starches such as foods made using white refined flour (white breads, pastries, sweets). Lose weight if you are overweight. Losing just 5-10% of your body weight can help your overall health and prevent diseases such as diabetes and heart disease. Monitor your sodium intake, especially if you have high blood pressure. Talk with your health care provider about your sodium intake. Try to incorporate more vegetarian meals weekly. What foods should I eat? Fruits All fresh, canned (in natural juice), or frozen fruits. Vegetables Fresh or frozen vegetables (raw, steamed, roasted, or grilled). Green salads. Grains Most grains. Choose whole wheat and whole grains most of the time. Rice and pasta, including brown rice and pastas made with whole wheat. Meats and other proteins Lean, well-trimmed beef, veal, pork, and lamb. Chicken and [place] without skin. All fish and shellfish. Wild duck, rabbit, pheasant, and venison. Egg whites or low-cholesterol egg substitutes. Dried beans, peas, lentils, and tofu. Seeds and most nuts. Dairy Low-fat or nonfat cheeses, including ricotta and mozzarella. Skim or 1% milk (liquid, powdered, or evaporated). Buttermilk made with low-fat milk. Nonfat or low-fat yogurt. Fats and oils Non-hydrogenated (trans-free) margarines. Vegetable oils, including soybean, sesame, sunflower, olive, avocado, peanut, safflower, corn, canola, and cottonseed. Salad dressings or mayonnaise made  with a vegetable oil. Beverages Water (mineral or sparkling). Coffee and tea. Unsweetened ice tea. Diet beverages. Sweets and desserts Sherbet, gelatin, and fruit ice. Small amounts of dark chocolate. Limit all sweets and desserts. Seasonings and condiments All seasonings and condiments. The items listed above may not be a complete list of foods and beverages you can eat.  Contact a dietitian for more options. What foods should I avoid? Fruits Canned fruit in heavy syrup. Fruit in cream or butter sauce. Fried fruit. Limit coconut. Vegetables Vegetables cooked in cheese, cream, or butter sauce. Fried vegetables. Grains Breads made with saturated or trans fats, oils, or whole milk. Croissants. Sweet rolls. Donuts. High-fat crackers, such as cheese crackers and chips. Meats and other proteins Fatty meats, such as hot dogs, ribs, sausage, bacon, rib-eye roast or steak. High-fat deli meats, such as salami and bologna. Caviar. Domestic duck and goose. Organ meats, such as liver. Dairy Cream, sour cream, cream cheese, and creamed cottage cheese. Whole-milk cheeses. Whole or 2% milk (liquid, evaporated, or condensed). Whole buttermilk. Cream sauce or high-fat cheese sauce. Whole-milk yogurt. Fats and oils Meat fat, or shortening. Cocoa butter, hydrogenated oils, palm oil, coconut oil, palm kernel oil. Solid fats and shortenings, including bacon fat, salt pork, lard, and butter. Nondairy cream substitutes. Salad dressings with cheese or sour cream. Beverages Regular sodas and any drinks with added sugar. Sweets and desserts Frosting. Pudding. Cookies. Cakes. Pies. Milk chocolate or white chocolate. Buttered syrups. Full-fat ice cream or ice cream drinks. The items listed above may not be a complete list of foods and beverages to avoid. Contact a dietitian for more information. Summary Heart-healthy meal planning includes limiting unhealthy fats, increasing healthy fats, limiting salt (sodium) intake and making other diet and lifestyle changes. Lose weight if you are overweight. Losing just 5-10% of your body weight can help your overall health and prevent diseases such as diabetes and heart disease. Focus on eating a balance of foods, including fruits and vegetables, low-fat or nonfat dairy, lean protein, nuts and legumes, whole grains, and heart-healthy oils and  fats. This information is not intended to replace advice given to you by your health care provider. Make sure you discuss any questions you have with your health care provider. Document Revised: 09/23/2021 Document Reviewed: 09/23/2021 Elsevier Patient Education  2024 [employer].

## 2023-03-16 ENCOUNTER — Other Ambulatory Visit: Payer: Self-pay

## 2023-03-16 DIAGNOSIS — E785 Hyperlipidemia, unspecified: Secondary | ICD-10-CM

## 2023-03-16 DIAGNOSIS — R011 Cardiac murmur, unspecified: Secondary | ICD-10-CM

## 2023-03-16 MED ORDER — ATORVASTATIN CALCIUM 20 MG PO TABS: 20.0000 mg | ORAL_TABLET | Freq: Every day | ORAL | 3 refills | Status: DC

## 2023-04-01 ENCOUNTER — Encounter (INDEPENDENT_AMBULATORY_CARE_PROVIDER_SITE_OTHER): Payer: Self-pay

## 2023-04-20 NOTE — Progress Notes (Unsigned)
No chief complaint on file.   HPI: Valerie Gates 67 y.o. come in for Chronic disease management  Dm:  ROS: See pertinent positives and negatives per HPI.  Past Medical History:  Diagnosis Date   Allergic rhinitis    Allergy    seasonal   Anxiety    Arthritis    lower right back    Cancer (HCC)    skin cancer-scalp   Diabetes mellitus without complication (HCC)    History of skin cancer    Hx of adenomatous polyp of colon 03/17/2018   Hypertension    Osteopenia     Family History  Problem Relation Age of Onset   Colon polyps Mother    Liver disease Sister    Pancreatic cancer Sister    Drug abuse Daughter    Nephrolithiasis Daughter    Diabetes Other    Hypertension Other    Lung cancer Other    Stroke Other        1st degree female <50   Colon cancer Neg Hx    Rectal cancer Neg Hx    Stomach cancer Neg Hx    Esophageal cancer Neg Hx    Breast cancer Neg Hx     Social History   Socioeconomic History   Marital status: Divorced    Spouse name: Not on file   Number of children: Not on file   Years of education: Not on file   Highest education level: Not on file  Occupational History   Not on file  Tobacco Use   Smoking status: Former   Smokeless tobacco: Never   Tobacco comments:    Quit 35 years ago  Vaping Use   Vaping status: Never Used  Substance and Sexual Activity   Alcohol use: No   Drug use: No   Sexual activity: Not on file  Other Topics Concern   Not on file  Social History Narrative   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   Has bf    Regular exercise- yes   HH of 2   1pet dogs   Daughter SUD hx  IVDU and incarderation     Social Determinants of Health   Financial Resource Strain: Not on file  Food Insecurity: Not on file  Transportation Needs: Not on file  Physical Activity: Sufficiently Active (01/15/2023)   Exercise Vital Sign    Days of Exercise per Week: 7 days    Minutes of Exercise per Session: 30  min  Stress: Not on file  Social Connections: Not on file    Outpatient Medications Prior to Visit  Medication Sig Dispense Refill   aspirin EC 81 MG tablet Take 1 tablet (81 mg total) by mouth daily.     atorvastatin (LIPITOR) 20 MG tablet Take 1 tablet (20 mg total) by mouth daily at 6 PM. 90 tablet 3   fexofenadine (ALLEGRA) 180 MG tablet Take 180 mg by mouth daily as needed for allergies or rhinitis.      fluticasone (FLONASE) 50 MCG/ACT nasal spray Use 2 spray(s) in each nostril once daily 16 g 0   LORazepam (ATIVAN) 0.5 MG tablet TAKE 1 TABLET (0.5 MG TOTAL) BY MOUTH 2 TIMES DAILY AS NEEDED FOR ANXIETY. CAN TAKE 2 IF NEEDED 30 tablet 0   losartan (COZAAR) 50 MG tablet Take 1 tablet (50 mg total) by mouth daily. 90 tablet 3   Semaglutide (RYBELSUS) 7 MG TABS TAKE 1 TABLET BY  MOUTH EVERY DAY FOR DIABETES 30 tablet 6   valACYclovir (VALTREX) 1000 MG tablet Take 1 tablet (1,000 mg total) by mouth 2 (two) times daily. X 3 days for outbreaks 20 tablet 3   Facility-Administered Medications Prior to Visit  Medication Dose Route Frequency Provider Last Rate Last Admin   0.9 %  sodium chloride infusion  500 mL Intravenous Once Iva Boop, MD         EXAM:  LMP  (LMP Unknown)   There is no height or weight on file to calculate BMI.  GENERAL: vitals reviewed and listed above, alert, oriented, appears well hydrated and in no acute distress HEENT: atraumatic, conjunctiva  clear, no obvious abnormalities on inspection of external nose and ears OP : no lesion edema or exudate  NECK: no obvious masses on inspection palpation  LUNGS: clear to auscultation bilaterally, no wheezes, rales or rhonchi, good air movement CV: HRRR, no clubbing cyanosis or  peripheral edema nl cap refill  MS: moves all extremities without noticeable focal  abnormality PSYCH: pleasant and cooperative, no obvious depression or anxiety Lab Results  Component Value Date   WBC 4.7 10/16/2022   HGB 13.7 10/16/2022    HCT 41.0 10/16/2022   PLT 225.0 10/16/2022   GLUCOSE 116 (H) 10/16/2022   CHOL 140 10/16/2022   TRIG 46.0 10/16/2022   HDL 57.50 10/16/2022   LDLDIRECT 152.9 06/26/2009   LDLCALC 73 10/16/2022   ALT 18 10/16/2022   AST 16 10/16/2022   NA 144 10/16/2022   K 3.9 10/16/2022   CL 107 10/16/2022   CREATININE 0.85 10/16/2022   BUN 20 10/16/2022   CO2 29 10/16/2022   TSH 1.48 10/16/2022   HGBA1C 6.7 (H) 10/16/2022   MICROALBUR 1.3 10/16/2022   BP Readings from Last 3 Encounters:  02/20/23 120/84  10/16/22 122/80  01/01/22 126/78    ASSESSMENT AND PLAN:  Discussed the following assessment and plan:  No diagnosis found.  -Patient advised to return or notify health care team  if  new concerns arise.  There are no Patient Instructions on file for this visit.   Neta Mends. Lynkin Saini M.D.

## 2023-04-21 ENCOUNTER — Ambulatory Visit (INDEPENDENT_AMBULATORY_CARE_PROVIDER_SITE_OTHER): Payer: Medicare Other | Admitting: Internal Medicine

## 2023-04-21 ENCOUNTER — Encounter: Payer: Self-pay | Admitting: Internal Medicine

## 2023-04-21 VITALS — BP 126/80 | HR 77 | Temp 98.0°F | Ht 64.5 in | Wt 138.8 lb

## 2023-04-21 DIAGNOSIS — Z79899 Other long term (current) drug therapy: Secondary | ICD-10-CM | POA: Diagnosis not present

## 2023-04-21 DIAGNOSIS — I10 Essential (primary) hypertension: Secondary | ICD-10-CM

## 2023-04-21 DIAGNOSIS — E1169 Type 2 diabetes mellitus with other specified complication: Secondary | ICD-10-CM | POA: Diagnosis not present

## 2023-04-21 LAB — POCT GLYCOSYLATED HEMOGLOBIN (HGB A1C): Hemoglobin A1C: 6.2 % — AB (ref 4.0–5.6)

## 2023-04-21 MED ORDER — RYBELSUS 7 MG PO TABS: ORAL_TABLET | ORAL | 6 refills | Status: DC

## 2023-04-21 NOTE — Patient Instructions (Signed)
Good to see you today . Refill meds   Hg A1c is 6.2   Plan 6 mos  cpe and full labs  pre visit as possible

## 2023-06-09 ENCOUNTER — Encounter: Payer: Self-pay | Admitting: Gastroenterology

## 2023-06-16 ENCOUNTER — Ambulatory Visit (INDEPENDENT_AMBULATORY_CARE_PROVIDER_SITE_OTHER): Payer: Medicare Other | Admitting: Gastroenterology

## 2023-06-16 ENCOUNTER — Encounter: Payer: Self-pay | Admitting: Gastroenterology

## 2023-06-16 VITALS — BP 108/74 | HR 99 | Ht 64.5 in | Wt 139.2 lb

## 2023-06-16 DIAGNOSIS — N816 Rectocele: Secondary | ICD-10-CM

## 2023-06-16 DIAGNOSIS — K219 Gastro-esophageal reflux disease without esophagitis: Secondary | ICD-10-CM

## 2023-06-16 DIAGNOSIS — Z860100 Personal history of colon polyps, unspecified: Secondary | ICD-10-CM

## 2023-06-16 DIAGNOSIS — R09A2 Foreign body sensation, throat: Secondary | ICD-10-CM

## 2023-06-16 DIAGNOSIS — R1310 Dysphagia, unspecified: Secondary | ICD-10-CM | POA: Diagnosis not present

## 2023-06-16 MED ORDER — SUTAB 1479-225-188 MG PO TABS: 24.0000 | ORAL_TABLET | Freq: Once | ORAL | 0 refills | Status: AC

## 2023-06-16 MED ORDER — PANTOPRAZOLE SODIUM 40 MG PO TBEC
40.0000 mg | DELAYED_RELEASE_TABLET | Freq: Every day | ORAL | 1 refills | Status: DC
Start: 2023-06-16 — End: 2023-07-20

## 2023-06-16 NOTE — Patient Instructions (Addendum)
We have sent the following medications to your pharmacy for you to pick up at your convenience: Pantoprazole 40 mg: Take once daily  You have been scheduled for an endoscopy and colonoscopy. Please follow the written instructions given to you at your visit today.  Please pick up your prep supplies at the pharmacy within the next 1-3 days.  If you use inhalers (even only as needed), please bring them with you on the day of your procedure.  DO NOT TAKE 7 DAYS PRIOR TO TEST- Trulicity (dulaglutide) Ozempic, Wegovy (semaglutide) Mounjaro (tirzepatide) Bydureon Bcise (exanatide extended release)  DO NOT TAKE 1 DAY PRIOR TO YOUR TEST Rybelsus (semaglutide) Adlyxin (lixisenatide) Victoza (liraglutide) Byetta (exanatide) ___________________________________________________________________________   Thank you for trusting me with your gastrointestinal care!   Boone Master, PA   If your blood pressure at your visit was 140/90 or greater, please contact your primary care physician to follow up on this. ______________________________________________________  If you are age 40 or older, your body mass index should be between 23-30. Your Body mass index is 23.53 kg/m. If this is out of the aforementioned range listed, please consider follow up with your Primary Care Provider.  If you are age 71 or younger, your body mass index should be between 19-25. Your Body mass index is 23.53 kg/m. If this is out of the aformentioned range listed, please consider follow up with your Primary Care Provider.  ________________________________________________________  The [facility] providers would like to encourage you to use [facility] to communicate with providers for non-urgent requests or questions.  Due to long hold times on the telephone, sending your provider a message by [facility] may be a faster and more efficient way to get a response.  Please allow 48 business hours for a response.  Please remember  that this is for non-urgent requests.  _______________________________________________________  Due to recent changes in healthcare laws, you may see the results of your imaging and laboratory studies on MyChart before your provider has had a chance to review them.  We understand that in some cases there may be results that are confusing or concerning to you. Not all laboratory results come back in the same time frame and the provider may be waiting for multiple results in order to interpret others.  Please give [place] 48 hours in order for your provider to thoroughly review all the results before contacting the office for clarification of your results.

## 2023-06-16 NOTE — Progress Notes (Signed)
Chief Complaint: GERD Primary GI MD: Dr. Leone Payor  HPI: 67 year old female with medical history as listed below presents for evaluation of GERD.  Patient was seen by gynecology (Dr. Ashley Royalty) [facility] 01/01/2023.  At that time she was having some fecal incontinence.  She had a rectal exam which was noted to have no hemorrhoids, rectal prolapse, or dovetail sign present.  Provider did note a decreased resting and decreased squeeze sphincter tone, moderate distal rectocele, no enterocele, no rectal masses, no dyskinesia when asking patient to bear down.  She was recommended to start fiber and avoid food triggers.  Upon her follow-up with them her stool consistency and incontinence had greatly improved with Metamucil.  Surgical repairs were avoided with conservative management.  Patient states over the last few months she has had increasing GERD that is worse about an hour after meals.  Denies NSAID use.  States she also feels something in her throat that feels like a ball of fire and makes swallowing difficult with solids.  She typically will try to swallow liquid to get it to go away.  She has been using Tums as needed which has provided relief.  Denies unintentional weight loss.  Denies melena/hematochezia.  Is doing well on her fiber and has had no further episodes of fecal incontinence.  She is scheduled to take a 2-week cruise to [place] this week and return on November 2.  Also, she is due for her colonoscopy and felt she did not tolerate prep very well the last time.  PREVIOUS GI WORKUP   Colonoscopy 03/10/2018 for screening - one 6 mm polyp in the cecum (tubular adenoma).  Resected and retrieved. - Diverticulosis in sigmoid colon - Otherwise normal - Due for repeat 03/2023  Colonoscopy 2008 for screening - Normal, no polyps  Past Medical History:  Diagnosis Date   Allergic rhinitis    Allergy    seasonal   Anxiety    Arthritis    lower right back     Cancer (HCC)    skin cancer-scalp   Diabetes mellitus without complication (HCC)    History of skin cancer    Hx of adenomatous polyp of colon 03/17/2018   Hypertension    Osteopenia     Past Surgical History:  Procedure Laterality Date   BLADDER SURGERY     COLONOSCOPY     skin cancer removed on head      Current Outpatient Medications  Medication Sig Dispense Refill   aspirin EC 81 MG tablet Take 1 tablet (81 mg total) by mouth daily.     atorvastatin (LIPITOR) 20 MG tablet Take 1 tablet (20 mg total) by mouth daily at 6 PM. 90 tablet 3   fexofenadine (ALLEGRA) 180 MG tablet Take 180 mg by mouth daily as needed for allergies or rhinitis.      fluticasone (FLONASE) 50 MCG/ACT nasal spray Use 2 spray(s) in each nostril once daily 16 g 0   LORazepam (ATIVAN) 0.5 MG tablet TAKE 1 TABLET (0.5 MG TOTAL) BY MOUTH 2 TIMES DAILY AS NEEDED FOR ANXIETY. CAN TAKE 2 IF NEEDED 30 tablet 0   losartan (COZAAR) 50 MG tablet Take 1 tablet (50 mg total) by mouth daily. 90 tablet 3   Semaglutide (RYBELSUS) 7 MG TABS TAKE 1 TABLET BY MOUTH EVERY DAY FOR DIABETES 30 tablet 6   valACYclovir (VALTREX) 1000 MG tablet Take 1 tablet (1,000 mg total) by mouth 2 (two) times daily. X 3 days for outbreaks  20 tablet 3   Current Facility-Administered Medications  Medication Dose Route Frequency Provider Last Rate Last Admin   0.9 %  sodium chloride infusion  500 mL Intravenous Once Iva Boop, MD        Allergies as of 06/16/2023 - Review Complete 04/21/2023  Allergen Reaction Noted   Jardiance [empagliflozin]  10/24/2019   Metformin Diarrhea 11/23/2015   Metformin and related Diarrhea 11/23/2015    Family History  Problem Relation Age of Onset   Colon polyps Mother    Liver disease Sister    Pancreatic cancer Sister    Drug abuse Daughter    Nephrolithiasis Daughter    Diabetes Other    Hypertension Other    Lung cancer Other    Stroke Other        1st degree female <50   Colon cancer Neg Hx     Rectal cancer Neg Hx    Stomach cancer Neg Hx    Esophageal cancer Neg Hx    Breast cancer Neg Hx     Social History   Socioeconomic History   Marital status: Divorced    Spouse name: Not on file   Number of children: Not on file   Years of education: Not on file   Highest education level: Not on file  Occupational History   Not on file  Tobacco Use   Smoking status: Former   Smokeless tobacco: Never   Tobacco comments:    Quit 35 years ago  Vaping Use   Vaping status: Never Used  Substance and Sexual Activity   Alcohol use: No   Drug use: No   Sexual activity: Not on file  Other Topics Concern   Not on file  Social History Narrative   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   Has bf    Regular exercise- yes   HH of 2   1pet dogs   Daughter SUD hx  IVDU and incarderation     Social Determinants of Health   Financial Resource Strain: Not on file  Food Insecurity: Not on file  Transportation Needs: Not on file  Physical Activity: Sufficiently Active (01/15/2023)   Exercise Vital Sign    Days of Exercise per Week: 7 days    Minutes of Exercise per Session: 30 min  Stress: Not on file  Social Connections: Not on file  Intimate Partner Violence: Not on file    Review of Systems:    Constitutional: No weight loss, fever, chills, weakness or fatigue HEENT: Eyes: No change in vision               Ears, Nose, Throat:  No change in hearing or congestion Skin: No rash or itching Cardiovascular: No chest pain, chest pressure or palpitations   Respiratory: No SOB or cough Gastrointestinal: See HPI and otherwise negative Genitourinary: No dysuria or change in urinary frequency Neurological: No headache, dizziness or syncope Musculoskeletal: No new muscle or joint pain Hematologic: No bleeding or bruising Psychiatric: No history of depression or anxiety    Physical Exam:  Vital signs: LMP  (LMP Unknown)   Constitutional: NAD, Well  developed, Well nourished, alert and cooperative Head:  Normocephalic and atraumatic. Eyes:   PEERL, EOMI. No icterus. Conjunctiva pink. Respiratory: Respirations even and unlabored. Lungs clear to auscultation bilaterally.   No wheezes, crackles, or rhonchi.  Cardiovascular:  Regular rate and rhythm. No peripheral edema, cyanosis or pallor.  Gastrointestinal:  Soft, nondistended,  nontender. No rebound or guarding. Normal bowel sounds. No appreciable masses or hepatomegaly. Rectal:  Not performed (patient declined).  Msk:  Symmetrical without gross deformities. Without edema, no deformity or joint abnormality.  Neurologic:  Alert and  oriented x4;  grossly normal neurologically.  Skin:   Dry and intact without significant lesions or rashes. Psychiatric: Oriented to person, place and time. Demonstrates good judgement and reason without abnormal affect or behaviors.   RELEVANT LABS AND IMAGING: CBC    Component Value Date/Time   WBC 4.7 10/16/2022 0910   RBC 4.24 10/16/2022 0910   HGB 13.7 10/16/2022 0910   HGB 13.3 09/14/2018 0825   HCT 41.0 10/16/2022 0910   HCT 40.3 09/14/2018 0825   PLT 225.0 10/16/2022 0910   PLT 222 09/14/2018 0825   MCV 96.8 10/16/2022 0910   MCV 95 09/14/2018 0825   MCH 31.4 09/14/2018 0825   MCHC 33.4 10/16/2022 0910   RDW 12.6 10/16/2022 0910   RDW 11.8 09/14/2018 0825   LYMPHSABS 1.4 10/16/2022 0910   LYMPHSABS 1.3 09/14/2018 0825   MONOABS 0.4 10/16/2022 0910   EOSABS 0.1 10/16/2022 0910   EOSABS 0.0 09/14/2018 0825   BASOSABS 0.0 10/16/2022 0910   BASOSABS 0.0 09/14/2018 0825    CMP     Component Value Date/Time   NA 144 10/16/2022 0910   NA 143 09/14/2018 0825   K 3.9 10/16/2022 0910   CL 107 10/16/2022 0910   CO2 29 10/16/2022 0910   GLUCOSE 116 (H) 10/16/2022 0910   BUN 20 10/16/2022 0910   BUN 19 09/14/2018 0825   CREATININE 0.85 10/16/2022 0910   CREATININE 0.80 08/18/2016 1206   CALCIUM 9.3 10/16/2022 0910   PROT 6.7 10/16/2022  0910   PROT 6.4 09/14/2018 0825   ALBUMIN 4.1 10/16/2022 0910   ALBUMIN 4.3 09/14/2018 0825   AST 16 10/16/2022 0910   ALT 18 10/16/2022 0910   ALKPHOS 70 10/16/2022 0910   BILITOT 0.7 10/16/2022 0910   BILITOT 0.5 09/14/2018 0825   GFRNONAA 73 09/14/2018 0825   GFRAA 84 09/14/2018 0825     Assessment/Plan:   Globus sensation Gastroesophageal reflux disease without esophagitis Dysphagia, unspecified type Increasing GERD over the last few months, no typical increase in normal stress, no NSAIDs.  Suspect her dysphagia is likely globus sensation as it resolves after Tums.  No previous EGD.  No red flag symptoms.  - Since she is going on her cruise I will go ahead and send in pantoprazole 40 Mg daily for 30 days to help with her symptoms while she is on vacation - Educated patient on lifestyle modifications and provided patient education handouts - EGD for further evaluation of esophagitis, gastritis - Possible dilation at the discretion of Dr. Leone Payor - I thoroughly discussed the procedure with the patient (at bedside) to include nature of the procedure, alternatives, benefits, and risks (including but not limited to bleeding, infection, perforation, anesthesia/cardiac pulmonary complications).  Patient verbalized understanding and gave verbal consent to proceed with procedure.   History of colonic polyps Due for repeat colonoscopy.  Struggled with the prep last time.  Requesting Sutab. - Schedule colonoscopy  Rectocele Seen by urogyn.  No further issues with fecal incontinence on fiber.  If symptoms return, continue to follow with urogyn.   Lara Mulch [facility] 06/16/2023, 1:13 PM  Cc: Madelin Headings, MD

## 2023-07-08 ENCOUNTER — Other Ambulatory Visit: Payer: Self-pay | Admitting: Gastroenterology

## 2023-07-08 DIAGNOSIS — K219 Gastro-esophageal reflux disease without esophagitis: Secondary | ICD-10-CM

## 2023-07-20 ENCOUNTER — Encounter: Payer: Self-pay | Admitting: Internal Medicine

## 2023-07-20 ENCOUNTER — Ambulatory Visit: Payer: Medicare Other | Admitting: Internal Medicine

## 2023-07-20 VITALS — BP 129/88 | HR 75 | Temp 98.2°F | Resp 15 | Ht 64.5 in | Wt 139.0 lb

## 2023-07-20 DIAGNOSIS — K2100 Gastro-esophageal reflux disease with esophagitis, without bleeding: Secondary | ICD-10-CM | POA: Diagnosis not present

## 2023-07-20 DIAGNOSIS — K219 Gastro-esophageal reflux disease without esophagitis: Secondary | ICD-10-CM

## 2023-07-20 DIAGNOSIS — Z860101 Personal history of adenomatous and serrated colon polyps: Secondary | ICD-10-CM | POA: Diagnosis not present

## 2023-07-20 DIAGNOSIS — Z09 Encounter for follow-up examination after completed treatment for conditions other than malignant neoplasm: Secondary | ICD-10-CM | POA: Diagnosis not present

## 2023-07-20 DIAGNOSIS — Z1211 Encounter for screening for malignant neoplasm of colon: Secondary | ICD-10-CM

## 2023-07-20 MED ORDER — SODIUM CHLORIDE 0.9 % IV SOLN: 500.0000 mL | Freq: Once | INTRAVENOUS | Status: DC

## 2023-07-20 MED ORDER — PANTOPRAZOLE SODIUM 40 MG PO TBEC: 40.0000 mg | DELAYED_RELEASE_TABLET | Freq: Every day | ORAL | 0 refills | Status: DC

## 2023-07-20 NOTE — Op Note (Signed)
[facility] Patient Name: Valerie Gates Procedure Date: 07/20/2023 2:45 PM MRN: [id number] Endoscopist: Iva Boop , MD, [id number] Age: 67 Referring MD:  Date of Birth: 1956/02/07 Gender: Female Account #: [id number] Procedure:                Upper GI endoscopy Indications:              Heartburn, Suspected esophageal reflux Medicines:                Monitored Anesthesia Care Procedure:                Pre-Anesthesia Assessment:                           - Prior to the procedure, a History and Physical                            was performed, and patient medications and                            allergies were reviewed. The patient's tolerance of                            previous anesthesia was also reviewed. The risks                            and benefits of the procedure and the sedation                            options and risks were discussed with the patient.                            All questions were answered, and informed consent                            was obtained. Prior Anticoagulants: The patient has                            taken no anticoagulant or antiplatelet agents. ASA                            Grade Assessment: II - A patient with mild systemic                            disease. After reviewing the risks and benefits,                            the patient was deemed in satisfactory condition to                            undergo the procedure.                           After obtaining informed consent, the endoscope was  passed under direct vision. Throughout the                            procedure, the patient's blood pressure, pulse, and                            oxygen saturations were monitored continuously. The                            GIF [id number] #[id number] was introduced through the                            mouth, and advanced to the second part of duodenum.                            The upper GI  endoscopy was accomplished without                            difficulty. The patient tolerated the procedure                            well. Scope In: Scope Out: Findings:                 LA Grade A (one or more mucosal breaks less than 5                            mm, not extending between tops of 2 mucosal folds)                            esophagitis with no bleeding was found at the                            gastroesophageal junction.                           A 4 cm hiatal hernia was present.                           The gastroesophageal flap valve was visualized                            endoscopically and classified as Hill Grade IV (no                            fold, wide open lumen, hiatal hernia present).                           The exam was otherwise without abnormality.                           The cardia and gastric fundus were normal on                            retroflexion. Complications:  No immediate complications. Estimated Blood Loss:     Estimated blood loss: none. Impression:               - LA Grade A reflux esophagitis with no bleeding.                           - 4 cm hiatal hernia.                           - Gastroesophageal flap valve classified as Hill                            Grade IV (no fold, wide open lumen, hiatal hernia                            present).                           - The examination was otherwise normal.                           - No specimens collected. Recommendation:           - Patient has a contact number available for                            emergencies. The signs and symptoms of potential                            delayed complications were discussed with the                            patient. Return to normal activities tomorrow.                            Written discharge instructions were provided to the                            patient.                           - GERD prevention diet.                            - she has been on PPI for 6-8 weeks and symptoms of                            heartburn and globus resolved - will recoomend                            another 1-2 mos pantoprazole and then try to switch                            to 20 mg OTC Pepcid qhs  Sxs mainly at night                           Follow GERD prevention diet Iva Boop, MD 07/20/2023 3:30:14 PM This report has been signed electronically.

## 2023-07-20 NOTE — Progress Notes (Signed)
[facility] History and Physical   Primary Care Physician:  Madelin Headings, MD   Reason for Procedure:  GERD symptoms and history of colon adenoma  Plan:    EGD and colonoscopy     HPI: Valerie Gates is a 67 y.o. female with history of removal of a 6 mm adenoma at colonoscopy in 2019, who was in the office with Cira Servant, PA-C last month with complaints of burning retrosternal pain and pyrosis thought to be GERD.  There was some globus versus dysphagia.  She was getting ready to go on a cruise in [place].  Pantoprazole 40 mg daily was started.  The patient requested a different prep for her colonoscopy because of difficulty tolerating it this last time and used Sutab for this procedure.   Past Medical History:  Diagnosis Date   Allergic rhinitis    Allergy    seasonal   Anxiety    Arthritis    lower right back    Cancer (HCC)    skin cancer-scalp   Diabetes mellitus without complication (HCC)    History of skin cancer    Hx of adenomatous polyp of colon 03/17/2018   Hypertension    Osteopenia     Past Surgical History:  Procedure Laterality Date   BLADDER SURGERY     COLONOSCOPY     skin cancer removed on head      Prior to Admission medications   Medication Sig Start Date End Date Taking? Authorizing Provider  aspirin EC 81 MG tablet Take 1 tablet (81 mg total) by mouth daily. 10/24/19   Lars Masson, MD  atorvastatin (LIPITOR) 20 MG tablet Take 1 tablet (20 mg total) by mouth daily at 6 PM. 03/16/23   Sharlene Dory, PA-C  fexofenadine (ALLEGRA) 180 MG tablet Take 180 mg by mouth daily as needed for allergies or rhinitis.     [provider]  fluticasone (FLONASE) 50 MCG/ACT nasal spray Use 2 spray(s) in each nostril once daily 11/25/21   Panosh, Neta Mends, MD  LORazepam (ATIVAN) 0.5 MG tablet TAKE 1 TABLET (0.5 MG TOTAL) BY MOUTH 2 TIMES DAILY AS NEEDED FOR ANXIETY. CAN TAKE 2 IF NEEDED 10/16/22   Panosh, Neta Mends, MD  losartan (COZAAR) 50  MG tablet Take 1 tablet (50 mg total) by mouth daily. 10/16/22   Panosh, Neta Mends, MD  pantoprazole (PROTONIX) 40 MG tablet Take 1 tablet (40 mg total) by mouth daily. 06/16/23 06/15/24  McMichael, Bayley M, PA-C  Semaglutide (RYBELSUS) 7 MG TABS TAKE 1 TABLET BY MOUTH EVERY DAY FOR DIABETES 04/21/23   Panosh, Neta Mends, MD  valACYclovir (VALTREX) 1000 MG tablet Take 1 tablet (1,000 mg total) by mouth 2 (two) times daily. X 3 days for outbreaks 10/16/22   Panosh, Neta Mends, MD    Current Outpatient Medications  Medication Sig Dispense Refill   aspirin EC 81 MG tablet Take 1 tablet (81 mg total) by mouth daily.     atorvastatin (LIPITOR) 20 MG tablet Take 1 tablet (20 mg total) by mouth daily at 6 PM. 90 tablet 3   fluticasone (FLONASE) 50 MCG/ACT nasal spray Use 2 spray(s) in each nostril once daily 16 g 0   losartan (COZAAR) 50 MG tablet Take 1 tablet (50 mg total) by mouth daily. 90 tablet 3   pantoprazole (PROTONIX) 40 MG tablet Take 1 tablet (40 mg total) by mouth daily. 30 tablet 1   Semaglutide (RYBELSUS) 7 MG TABS TAKE 1 TABLET  BY MOUTH EVERY DAY FOR DIABETES 30 tablet 6   valACYclovir (VALTREX) 1000 MG tablet Take 1 tablet (1,000 mg total) by mouth 2 (two) times daily. X 3 days for outbreaks 20 tablet 3   fexofenadine (ALLEGRA) 180 MG tablet Take 180 mg by mouth daily as needed for allergies or rhinitis.      LORazepam (ATIVAN) 0.5 MG tablet TAKE 1 TABLET (0.5 MG TOTAL) BY MOUTH 2 TIMES DAILY AS NEEDED FOR ANXIETY. CAN TAKE 2 IF NEEDED 30 tablet 0   Current Facility-Administered Medications  Medication Dose Route Frequency Provider Last Rate Last Admin   0.9 %  sodium chloride infusion  500 mL Intravenous Once Iva Boop, MD       0.9 %  sodium chloride infusion  500 mL Intravenous Once Iva Boop, MD        Allergies as of 07/20/2023 - Review Complete 07/20/2023  Allergen Reaction Noted   Jardiance [empagliflozin]  10/24/2019   Metformin Diarrhea 11/23/2015   Metformin and  related Diarrhea 11/23/2015    Family History  Problem Relation Age of Onset   Colon polyps Mother    Liver disease Sister    Pancreatic cancer Sister    Drug abuse Daughter    Nephrolithiasis Daughter    Diabetes Other    Hypertension Other    Lung cancer Other    Stroke Other        1st degree female <50   Ulcerative colitis Half-Sister    Colon cancer Neg Hx    Rectal cancer Neg Hx    Stomach cancer Neg Hx    Esophageal cancer Neg Hx    Breast cancer Neg Hx     Social History   Socioeconomic History   Marital status: Divorced    Spouse name: Not on file   Number of children: Not on file   Years of education: Not on file   Highest education level: Not on file  Occupational History   Not on file  Tobacco Use   Smoking status: Former   Smokeless tobacco: Never   Tobacco comments:    Quit 35 years ago  Vaping Use   Vaping status: Never Used  Substance and Sexual Activity   Alcohol use: Yes    Comment: occ   Drug use: No   Sexual activity: Not on file  Other Topics Concern   Not on file  Social History Narrative   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  Physical Exam: Vital signs BP 138/78   Pulse 73   Temp 98.2 F (36.8 C)   Resp (!) 21   Ht 5' 4.5" (1.638 m)   Wt 139 lb (63 kg)   LMP  (LMP Unknown)  SpO2 99%   BMI 23.49 kg/m   General:    Alert,  Well-developed, well-nourished, pleasant and cooperative in NAD Lungs:  Clear throughout to auscultation.   Heart:  Regular rate and rhythm; no murmurs, clicks, rubs,  or gallops. Abdomen:  Soft, nontender and nondistended. Normal bowel sounds.   Neuro/Psych:  Alert and cooperative. Normal mood and affect. A and O x 3   @Ryenne Lynam  Sena Slate, MD, [facility] [phone] (pager) 07/20/2023 2:47 PM@

## 2023-07-20 NOTE — Op Note (Signed)
[facility] Patient Name: Breanda Eggleston Procedure Date: 07/20/2023 2:47 PM MRN: [id number] Endoscopist: Iva Boop , MD, [id number] Age: 67 Referring MD:  Date of Birth: Nov 19, 1955 Gender: Female Account #: [id number] Procedure:                Colonoscopy Indications:              Surveillance: Personal history of adenomatous                            polyps on last colonoscopy 5 years ago, Last                            colonoscopy: 2019 Medicines:                Monitored Anesthesia Care Procedure:                Pre-Anesthesia Assessment:                           - Prior to the procedure, a History and Physical                            was performed, and patient medications and                            allergies were reviewed. The patient's tolerance of                            previous anesthesia was also reviewed. The risks                            and benefits of the procedure and the sedation                            options and risks were discussed with the patient.                            All questions were answered, and informed consent                            was obtained. Prior Anticoagulants: The patient has                            taken no anticoagulant or antiplatelet agents. ASA                            Grade Assessment: II - A patient with mild systemic                            disease. After reviewing the risks and benefits,                            the patient was deemed in satisfactory condition to  undergo the procedure.                           - Prior to the procedure, a History and Physical                            was performed, and patient medications and                            allergies were reviewed. The patient's tolerance of                            previous anesthesia was also reviewed. The risks                            and benefits of the procedure and the sedation                             options and risks were discussed with the patient.                            All questions were answered, and informed consent                            was obtained. Prior Anticoagulants: The patient has                            taken no anticoagulant or antiplatelet agents. ASA                            Grade Assessment: II - A patient with mild systemic                            disease. After reviewing the risks and benefits,                            the patient was deemed in satisfactory condition to                            undergo the procedure.                           After obtaining informed consent, the colonoscope                            was passed under direct vision. Throughout the                            procedure, the patient's blood pressure, pulse, and                            oxygen saturations were monitored continuously. The  Olympus Scope [id number] was introduced through the                            anus and advanced to the the cecum, identified by                            appendiceal orifice and ileocecal valve. The                            colonoscopy was performed without difficulty. The                            patient tolerated the procedure well. The quality                            of the bowel preparation was excellent. The                            ileocecal valve, appendiceal orifice, and rectum                            were photographed. The bowel preparation used was                            SUTAB via split dose instruction. Scope In: 3:00:37 PM Scope Out: 3:12:44 PM Scope Withdrawal Time: 0 hours 8 minutes 7 seconds  Total Procedure Duration: 0 hours 12 minutes 7 seconds  Findings:                 The perianal and digital rectal examinations were                            normal.                           Multiple diverticula were found in the sigmoid                             colon.                           The exam was otherwise without abnormality on                            direct and retroflexion views. Complications:            No immediate complications. Estimated Blood Loss:     Estimated blood loss: none. Impression:               - Diverticulosis in the sigmoid colon.                           - The examination was otherwise normal on direct                            and retroflexion views.                           -  No specimens collected.                           - Personal history of colonic polyp 6 mm adeneoma                            removed 2019. Recommendation:           - Patient has a contact number available for                            emergencies. The signs and symptoms of potential                            delayed complications were discussed with the                            patient. Return to normal activities tomorrow.                            Written discharge instructions were provided to the                            patient.                           - Resume previous diet.                           - Continue present medications.                           - Repeat colonoscopy in 10 years. Iva Boop, MD 07/20/2023 3:32:36 PM This report has been signed electronically.

## 2023-07-20 NOTE — Progress Notes (Signed)
To pacu, VSS. Report to [email or web address] 

## 2023-07-20 NOTE — Patient Instructions (Addendum)
Please read handouts provided. Continue present medications. Repeat colonoscopy in 10 years. Follow GERD prevention diet.   YOU HAD AN ENDOSCOPIC PROCEDURE TODAY AT THE [facility]:   Refer to the procedure report that was given to you for any specific questions about what was found during the examination.  If the procedure report does not answer your questions, please call your gastroenterologist to clarify.  If you requested that your care partner not be given the details of your procedure findings, then the procedure report has been included in a sealed envelope for you to review at your convenience later.  YOU SHOULD EXPECT: Some feelings of bloating in the abdomen. Passage of more gas than usual.  Walking can help get rid of the air that was put into your GI tract during the procedure and reduce the bloating. If you had a lower endoscopy (such as a colonoscopy or flexible sigmoidoscopy) you may notice spotting of blood in your stool or on the toilet paper. If you underwent a bowel prep for your procedure, you may not have a normal bowel movement for a few days.  Please Note:  You might notice some irritation and congestion in your nose or some drainage.  This is from the oxygen used during your procedure.  There is no need for concern and it should clear up in a day or so.  SYMPTOMS TO REPORT IMMEDIATELY:  Following lower endoscopy (colonoscopy or flexible sigmoidoscopy):  Excessive amounts of blood in the stool  Significant tenderness or worsening of abdominal pains  Swelling of the abdomen that is new, acute  Fever of 100F or higher  Following upper endoscopy (EGD)  Vomiting of blood or coffee ground material  New chest pain or pain under the shoulder blades  Painful or persistently difficult swallowing  New shortness of breath  Fever of 100F or higher  Black, tarry-looking stools  For urgent or emergent issues, a gastroenterologist can be reached at any hour by  calling [phone]. Do not use MyChart messaging for urgent concerns.    DIET:  We do recommend a small meal at first, but then you may proceed to your regular diet.  Drink plenty of fluids but you should avoid alcoholic beverages for 24 hours.  ACTIVITY:  You should plan to take it easy for the rest of today and you should NOT DRIVE or use heavy machinery until tomorrow (because of the sedation medicines used during the test).    FOLLOW UP: Our staff will call the number listed on your records the next business day following your procedure.  We will call around 7:15- 8:00 am to check on you and address any questions or concerns that you may have regarding the information given to you following your procedure. If we do not reach you, we will leave a message.     If any biopsies were taken you will be contacted by phone or by letter within the next 1-3 weeks.  Please call us at [phone] if you have not heard about the biopsies in 3 weeks.    SIGNATURES/CONFIDENTIALITY: You and/or your care partner have signed paperwork which will be entered into your electronic medical record.  These signatures attest to the fact that that the information above on your After Visit Summary has been reviewed and is understood.  Full responsibility of the confidentiality of this discharge information lies with you and/or your care-partner.There was slight inflammation at the end of the esophagus where it meets the  stomach. I think this is from acid reflux. There is also a hiatal hernia - please read the handout.  I have reordered some pantoprazole - I recommend that you take that before suppertime and do so for another 1-2 months.  Then you can try over the counter Pepcid at bedtime. If that fails to control things then can return to pantoprazole.  Colonoscopy - no polyps. You do have diverticulosis - thickened muscle rings and pouches in the colon wall. Please read the handout about this  condition.  Next routine colonoscopy in 10 years - 2034.   I appreciate the opportunity to care for you. Iva Boop, MD, [facility]

## 2023-07-21 ENCOUNTER — Telehealth: Payer: Self-pay

## 2023-07-21 NOTE — Telephone Encounter (Signed)
  Follow up Call-     07/20/2023    1:50 PM  Call back number  Post procedure Call Back phone  # [phone]  Permission to leave phone message Yes   Called patient regarding follow-up phone call. No answer, VM left.

## 2023-09-09 LAB — HM DIABETES EYE EXAM

## 2023-10-29 ENCOUNTER — Encounter: Payer: Medicare Other | Admitting: Internal Medicine

## 2023-11-05 ENCOUNTER — Ambulatory Visit: Payer: Medicare Other | Admitting: Internal Medicine

## 2023-11-05 VITALS — BP 130/82 | HR 83 | Temp 97.9°F | Ht 63.75 in | Wt 138.0 lb

## 2023-11-05 DIAGNOSIS — E785 Hyperlipidemia, unspecified: Secondary | ICD-10-CM | POA: Diagnosis not present

## 2023-11-05 DIAGNOSIS — I10 Essential (primary) hypertension: Secondary | ICD-10-CM

## 2023-11-05 DIAGNOSIS — Z0000 Encounter for general adult medical examination without abnormal findings: Secondary | ICD-10-CM | POA: Diagnosis not present

## 2023-11-05 DIAGNOSIS — Z79899 Other long term (current) drug therapy: Secondary | ICD-10-CM

## 2023-11-05 DIAGNOSIS — Z2821 Immunization not carried out because of patient refusal: Secondary | ICD-10-CM | POA: Diagnosis not present

## 2023-11-05 DIAGNOSIS — E118 Type 2 diabetes mellitus with unspecified complications: Secondary | ICD-10-CM

## 2023-11-05 NOTE — Progress Notes (Signed)
 Chief Complaint  Patient presents with   Annual Exam    HPI: Patient  Valerie Gates  68 y.o. comes in today for Preventive Health Care visit  and  CDM  DM on rybelsus doing well  HT  controlled  losartan HDL atorva no se reported  Gi eval  has HH  better on protonix  per dr Leone Payor  now   on prn pepcid  controlling sx  No neuropathy Cv Pulm sx  active   Rare use of  ativan  a few times per year  Health Maintenance  Topic Date Due   Pneumonia Vaccine 12+ Years old (1 of 2 - PCV) Never done   Hepatitis C Screening  Never done   COVID-19 Vaccine (3 - Mixed Product risk series) 01/06/2020   INFLUENZA VACCINE  04/02/2023   Diabetic kidney evaluation - eGFR measurement  10/17/2023   Diabetic kidney evaluation - Urine ACR  10/17/2023   HEMOGLOBIN A1C  10/22/2023   Medicare Annual Wellness (AWV)  01/15/2024   OPHTHALMOLOGY EXAM  09/08/2024   FOOT EXAM  11/04/2024   MAMMOGRAM  12/11/2024   Colonoscopy  07/19/2033   DEXA SCAN  Completed   Zoster Vaccines- Shingrix  Completed   HPV VACCINES  Aged Out   DTaP/Tdap/Td  Discontinued   Health Maintenance Review LIFESTYLE:  Exercise:   walk if weather yoga .  Tobacco/ETS: n Alcohol: ocass Sugar beverages: 2-3 x per week  Sleep:5-6  Drug use: no HH of  2  2 dogs Still retired    ROS:  GEN/ HEENT: No fever, significant weight changes sweats headaches vision problems hearing changes, CV/ PULM; No chest pain shortness of breath cough, syncope,edema  change in exercise tolerance. GI /GU: No adominal pain, vomiting, change in bowel habits. No blood in the stool. No significant GU symptoms. SKIN/HEME: ,no acute skin rashes suspicious lesions or bleeding. No lymphadenopathy, nodules, masses.  NEURO/ PSYCH:  No neurologic signs such as weakness numbness. No depression anxiety. IMM/ Allergy: No unusual infections.  Allergy .   REST of 12 system review negative except as per HPI   Past Medical History:  Diagnosis Date   Allergic  rhinitis    Allergy    seasonal   Anxiety    Arthritis    lower right back    Cancer (HCC)    skin cancer-scalp   Diabetes mellitus without complication (HCC)    History of skin cancer    Hx of adenomatous polyp of colon 03/17/2018   Hypertension    Osteopenia     Past Surgical History:  Procedure Laterality Date   BLADDER SURGERY     COLONOSCOPY     skin cancer removed on head      Family History  Problem Relation Age of Onset   Colon polyps Mother    Liver disease Sister    Pancreatic cancer Sister    Drug abuse Daughter    Nephrolithiasis Daughter    Diabetes Other    Hypertension Other    Lung cancer Other    Stroke Other        1st degree female <50   Ulcerative colitis Half-Sister    Colon cancer Neg Hx    Rectal cancer Neg Hx    Stomach cancer Neg Hx    Esophageal cancer Neg Hx    Breast cancer Neg Hx     Social History   Socioeconomic History   Marital status: Divorced    Spouse  name: Not on file   Number of children: Not on file   Years of education: Not on file   Highest education level: Not on file  Occupational History   Not on file  Tobacco Use   Smoking status: Former   Smokeless tobacco: Never   Tobacco comments:    Quit 35 years ago  Vaping Use   Vaping status: Never Used  Substance and Sexual Activity   Alcohol use: Yes    Comment: occ   Drug use: No   Sexual activity: Not on file  Other Topics Concern   Not on file  Social History Narrative   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     atorvastatin (LIPITOR) 20 MG tablet Take 1 tablet (20 mg total) by mouth daily at 6 PM. 90 tablet 3   fluticasone (FLONASE) 50 MCG/ACT nasal spray Use 2 spray(s) in each nostril once daily 16 g 0   LORazepam (ATIVAN) 0.5 MG tablet TAKE 1 TABLET (0.5 MG TOTAL) BY MOUTH 2 TIMES DAILY AS NEEDED FOR ANXIETY. CAN TAKE 2 IF NEEDED 30 tablet 0   losartan (COZAAR) 50 MG tablet Take 1 tablet (50 mg total) by mouth daily. 90 tablet 3   Semaglutide (RYBELSUS) 7 MG TABS TAKE 1 TABLET BY MOUTH EVERY DAY FOR DIABETES 30 tablet 6   valACYclovir (VALTREX) 1000 MG tablet Take 1 tablet (1,000 mg total) by mouth 2 (two) times daily. X 3 days for outbreaks 20 tablet 3   pantoprazole (PROTONIX) 40 MG tablet Take 1 tablet (40 mg total) by mouth daily before supper. (Patient not taking: Reported on 11/05/2023) 90 tablet 0   fexofenadine (ALLEGRA) 180 MG tablet Take 180 mg by mouth daily as needed for allergies or rhinitis.      No facility-administered medications prior to visit.     EXAM:  BP 130/82   Pulse 83   Temp 97.9 F (36.6 C) (Oral)   Ht 5' 3.75" (1.619 m)   Wt 138 lb (62.6 kg)   LMP  (LMP Unknown)   SpO2 96%   BMI 23.87 kg/m   Body mass index is 23.87 kg/m. Wt Readings from Last 3 Encounters:  11/05/23 138 lb (62.6 kg)  07/20/23 139 lb (63 kg)  06/16/23 139 lb 4 oz (63.2 kg)    Physical Exam: Vital signs reviewed ZOX:WRUE is a well-developed well-nourished alert cooperative    who appearsr stated age in no acute distress.  HEENT: normocephalic atraumatic , Eyes: PERRL EOM's full, conjunctiva clear, Nares: paten,t no deformity discharge or tenderness., Ears: no deformity EAC's clear TMs with normal landmarks. Mouth: clear OP, no lesions, edema.  Moist mucous membranes. Dentition in adequate repair. NECK: supple without  masses, thyromegaly or bruits. CHEST/PULM:  Clear to auscultation and percussion breath sounds equal no wheeze , rales or rhonchi. No chest wall deformities or tenderness. Breast: normal by inspection . No dimpling, discharge, masses, tenderness or discharge . CV: PMI is nondisplaced, S1 S2 no gallops, murmurs, rubs. Peripheral pulses are full without delay.No JVD .  ABDOMEN: Bowel sounds normal nontender  No guard or rebound, no hepato splenomegal no CVA tenderness.  No hernia. Extremtities:  No clubbing cyanosis or edema, no acute joint swelling or redness no focal atrophy NEURO:  Oriented x3, cranial nerves 3-12 appear to be intact, no obvious focal weakness,gait within normal limits no abnormal reflexes or asymmetrical SKIN: No acute rashes normal turgor, color, no bruising or petechiae. PSYCH: Oriented, good eye contact, no obvious depression anxiety, cognition and judgment appear normal. LN: no cervical axillary adenopathy Diabetic Foot Exam - Simple   Simple Foot Form Diabetic Foot exam was performed with the following findings: Yes 11/05/2023 11:35 AM  Visual Inspection No deformities, no ulcerations, no other skin breakdown bilaterally: Yes Sensation Testing Intact to touch and monofilament testing bilaterally: Yes Pulse Check Posterior Tibialis and Dorsalis pulse intact bilaterally: Yes Comments      Lab Results  Component Value Date   WBC 4.7 10/16/2022   HGB 13.7 10/16/2022   HCT 41.0 10/16/2022   PLT 225.0 10/16/2022   GLUCOSE 116 (H) 10/16/2022   CHOL 140 10/16/2022   TRIG 46.0 10/16/2022   HDL 57.50 10/16/2022   LDLDIRECT 152.9 06/26/2009   LDLCALC 73 10/16/2022   ALT 18 10/16/2022   AST 16 10/16/2022   NA 144 10/16/2022   K 3.9 10/16/2022   CL 107 10/16/2022   CREATININE 0.85 10/16/2022   BUN 20 10/16/2022   CO2 29 10/16/2022   TSH 1.48 10/16/2022   HGBA1C 6.2 (A) 04/21/2023   MICROALBUR 1.3 10/16/2022    BP Readings from Last 3 Encounters:  11/05/23  130/82  07/20/23 129/88  06/16/23 108/74    Lab plan  reviewed with patient  will get fasting at [facility] lab when convenient  will include ua and Ur microalb ratio  ASSESSMENT AND PLAN:  Discussed the following assessment and plan:    ICD-10-CM   1. Visit for preventive health examination  Z00.00     2. Controlled type 2 diabetes mellitus with complication, without long-term current use of insulin (HCC)  E11.8 Basic metabolic panel    CBC with Differential/Platelet    Hemoglobin A1c    Hepatic function panel    Lipid panel    TSH    Microalbumin / creatinine urine ratio    Urinalysis, Routine w reflex microscopic    3. Medication management  Z79.899 Basic metabolic panel    CBC with Differential/Platelet    Hemoglobin A1c    Hepatic function panel    Lipid panel    TSH    Microalbumin / creatinine urine ratio    Urinalysis, Routine w reflex microscopic    4. Essential hypertension  I10 Basic metabolic panel    CBC with Differential/Platelet    Hemoglobin A1c    Hepatic function panel    Lipid panel    TSH    Microalbumin / creatinine urine ratio    Urinalysis, Routine w reflex microscopic    5. Hyperlipidemia, unspecified hyperlipidemia type  E78.5 Basic metabolic panel    CBC with Differential/Platelet    Hemoglobin A1c    Hepatic function panel    Lipid panel  TSH    Microalbumin / creatinine urine ratio    Urinalysis, Routine w reflex microscopic    6. Immunization declined  Z28.21    flu  pna     Conditions appear controlled but updated lab due.  Family hx of cv diseae  Continue metabolic control   med and monitoring  Return in about 6 months (around 05/07/2024).  Patient Care Team: Camera Krienke, Neta Mends, MD as PCP - General Lars Masson, MD as PCP - Cardiology (Cardiology) Lomax, Billey Chang, MD (Inactive) (Obstetrics and Gynecology) Maxie Better, MD as Consulting Physician (Obstetrics and Gynecology) Venancio Poisson, MD as Consulting Physician  (Dermatology) Maris Berger, MD as Consulting Physician (Ophthalmology) Patient Instructions  Good to see you today. Exam is normal  Get fasting lab work at [facility] lab  any time   If all ok then 6 months appt and A1c .  Neta Mends. Deep Bonawitz M.D.

## 2023-11-05 NOTE — Patient Instructions (Signed)
 Good to see you today. Exam is normal  Get fasting lab work at [facility] lab  any time   If all ok then 6 months appt and A1c .

## 2023-11-06 ENCOUNTER — Encounter: Payer: Self-pay | Admitting: Internal Medicine

## 2023-11-06 ENCOUNTER — Other Ambulatory Visit: Payer: Self-pay | Admitting: Internal Medicine

## 2023-11-06 DIAGNOSIS — K219 Gastro-esophageal reflux disease without esophagitis: Secondary | ICD-10-CM

## 2023-11-10 ENCOUNTER — Other Ambulatory Visit (INDEPENDENT_AMBULATORY_CARE_PROVIDER_SITE_OTHER)

## 2023-11-10 DIAGNOSIS — Z79899 Other long term (current) drug therapy: Secondary | ICD-10-CM | POA: Diagnosis not present

## 2023-11-10 DIAGNOSIS — E118 Type 2 diabetes mellitus with unspecified complications: Secondary | ICD-10-CM | POA: Diagnosis not present

## 2023-11-10 DIAGNOSIS — E785 Hyperlipidemia, unspecified: Secondary | ICD-10-CM | POA: Diagnosis not present

## 2023-11-10 DIAGNOSIS — I10 Essential (primary) hypertension: Secondary | ICD-10-CM

## 2023-11-10 LAB — CBC WITH DIFFERENTIAL/PLATELET
Basophils Absolute: 0.1 10*3/uL (ref 0.0–0.1)
Basophils Relative: 1 % (ref 0.0–3.0)
Eosinophils Absolute: 0.1 10*3/uL (ref 0.0–0.7)
Eosinophils Relative: 2.7 % (ref 0.0–5.0)
HCT: 41.2 % (ref 36.0–46.0)
Hemoglobin: 13.8 g/dL (ref 12.0–15.0)
Lymphocytes Relative: 27.6 % (ref 12.0–46.0)
Lymphs Abs: 1.4 10*3/uL (ref 0.7–4.0)
MCHC: 33.4 g/dL (ref 30.0–36.0)
MCV: 97.2 fl (ref 78.0–100.0)
Monocytes Absolute: 0.4 10*3/uL (ref 0.1–1.0)
Monocytes Relative: 7.7 % (ref 3.0–12.0)
Neutro Abs: 3.1 10*3/uL (ref 1.4–7.7)
Neutrophils Relative %: 61 % (ref 43.0–77.0)
Platelets: 232 10*3/uL (ref 150.0–400.0)
RBC: 4.24 Mil/uL (ref 3.87–5.11)
RDW: 12.7 % (ref 11.5–15.5)
WBC: 5.1 10*3/uL (ref 4.0–10.5)

## 2023-11-10 LAB — BASIC METABOLIC PANEL
BUN: 16 mg/dL (ref 6–23)
CO2: 28 meq/L (ref 19–32)
Calcium: 9.1 mg/dL (ref 8.4–10.5)
Chloride: 109 meq/L (ref 96–112)
Creatinine, Ser: 0.78 mg/dL (ref 0.40–1.20)
GFR: 78.93 mL/min (ref >60.00)
Glucose, Bld: 104 mg/dL — ABNORMAL HIGH (ref 70–99)
Potassium: 3.9 meq/L (ref 3.5–5.1)
Sodium: 143 meq/L (ref 135–145)

## 2023-11-10 LAB — URINALYSIS, ROUTINE W REFLEX MICROSCOPIC
Bilirubin Urine: NEGATIVE
Nitrite: NEGATIVE
Specific Gravity, Urine: 1.025 (ref 1.000–1.030)
Total Protein, Urine: NEGATIVE
Urine Glucose: NEGATIVE
Urobilinogen, UA: 0.2 (ref 0.0–1.0)
pH: 6 (ref 5.0–8.0)

## 2023-11-10 LAB — MICROALBUMIN / CREATININE URINE RATIO
Creatinine,U: 183.3 mg/dL
Microalb Creat Ratio: 16.8 mg/g (ref 0.0–30.0)
Microalb, Ur: 3.1 mg/dL — ABNORMAL HIGH (ref 0.0–1.9)

## 2023-11-10 LAB — TSH: TSH: 1.46 u[IU]/mL (ref 0.35–5.50)

## 2023-11-10 LAB — LIPID PANEL
Cholesterol: 131 mg/dL (ref 0–200)
HDL: 52.1 mg/dL (ref >39.00)
LDL Cholesterol: 58 mg/dL (ref 0–99)
NonHDL: 79.32
Total CHOL/HDL Ratio: 3
Triglycerides: 108 mg/dL (ref 0.0–149.0)
VLDL: 21.6 mg/dL (ref 0.0–40.0)

## 2023-11-10 LAB — HEPATIC FUNCTION PANEL
ALT: 17 U/L (ref 0–35)
AST: 15 U/L (ref 0–37)
Albumin: 4.3 g/dL (ref 3.5–5.2)
Alkaline Phosphatase: 80 U/L (ref 39–117)
Bilirubin, Direct: 0.2 mg/dL (ref 0.0–0.3)
Total Bilirubin: 0.7 mg/dL (ref 0.2–1.2)
Total Protein: 6.5 g/dL (ref 6.0–8.3)

## 2023-11-10 LAB — HEMOGLOBIN A1C: Hgb A1c MFr Bld: 6.7 % — ABNORMAL HIGH (ref 4.6–6.5)

## 2023-11-11 ENCOUNTER — Telehealth: Payer: Self-pay

## 2023-11-11 NOTE — Telephone Encounter (Signed)
 Copied from CRM [id number]. Topic: Clinical - Medication Question >> Nov 11, 2023  2:50 PM Valerie Gates wrote: Reason for CRM: pt called to speak with nurse regarding lab result. States she received and email. Stating the labs were incorrect, pt would like to clarify is the information is true or not please call pt back at [phone]

## 2023-11-16 ENCOUNTER — Other Ambulatory Visit: Payer: Self-pay | Admitting: Internal Medicine

## 2023-11-16 DIAGNOSIS — Z1231 Encounter for screening mammogram for malignant neoplasm of breast: Secondary | ICD-10-CM

## 2023-11-16 NOTE — Telephone Encounter (Signed)
 Spoke to pt. Pt wants to know her lab result from 11/10/2023 and wants to know what's next plan in regards to kidney function error test.   Inform pt message will forward to Dr. Fabian Sharp. Will contact pt  back once receive a respond. Pt verbalized understanding.   Please advise.

## 2023-11-18 ENCOUNTER — Encounter: Payer: Self-pay | Admitting: Internal Medicine

## 2023-11-18 NOTE — Progress Notes (Signed)
 Results stable or normal  and not concern except the urinalysis  is off  similar to uti but not  confirmed . I would like you to get another good clean catch urina for microscopic and culture to be sure .    Be hydrated and get specimen in mid stream . ( Also see other message  )

## 2023-11-18 NOTE — Telephone Encounter (Signed)
 Nt to worry ,your  result was not one that required fu repeat and recent was correct.    However   unrelated  Your plain urinalysis  was still off   I was ou of office last week so didn't  relate this yet    But we should follo wup.    DO you have any UTI symptoms at this time?  I will ask for another microscopic urine to be sure  no concerns  sometimes external  irritation will show up  in the urine in females and not a  urinary tract concern. Marland Kitchen

## 2023-11-19 ENCOUNTER — Other Ambulatory Visit: Payer: Self-pay

## 2023-11-19 DIAGNOSIS — R8290 Unspecified abnormal findings in urine: Secondary | ICD-10-CM

## 2023-11-19 NOTE — Telephone Encounter (Signed)
 Spoke to pt and went over provider's message.  Pt states she does have lower back pain and abdominal pain. Pt states she has diverticulitis. Pt denied fever, no feeling urgency, no burning when urinate.   Lab orders are placed and lab appt is scheduled.   Forwarding to provider for fyi.

## 2023-11-20 ENCOUNTER — Other Ambulatory Visit

## 2023-11-20 DIAGNOSIS — R8290 Unspecified abnormal findings in urine: Secondary | ICD-10-CM

## 2023-11-21 LAB — URINE CULTURE
MICRO NUMBER:: 16231850
Result:: NO GROWTH
SPECIMEN QUALITY:: ADEQUATE

## 2023-11-22 ENCOUNTER — Encounter: Payer: Self-pay | Admitting: Internal Medicine

## 2023-11-22 NOTE — Progress Notes (Signed)
 Urine culture shows no  UTI bacteria     . No further work up   but if having problems then make a fu appt

## 2023-11-23 NOTE — Telephone Encounter (Signed)
 Urine culture lab was added.

## 2023-11-24 ENCOUNTER — Emergency Department (HOSPITAL_BASED_OUTPATIENT_CLINIC_OR_DEPARTMENT_OTHER)

## 2023-11-24 ENCOUNTER — Other Ambulatory Visit: Payer: Self-pay

## 2023-11-24 ENCOUNTER — Emergency Department (HOSPITAL_BASED_OUTPATIENT_CLINIC_OR_DEPARTMENT_OTHER)
Admission: EM | Admit: 2023-11-24 | Discharge: 2023-11-24 | Disposition: A | Discharge status: Home / Self Care | Attending: Emergency Medicine | Admitting: Emergency Medicine

## 2023-11-24 ENCOUNTER — Encounter (HOSPITAL_BASED_OUTPATIENT_CLINIC_OR_DEPARTMENT_OTHER): Payer: Self-pay | Admitting: Emergency Medicine

## 2023-11-24 DIAGNOSIS — R1032 Left lower quadrant pain: Secondary | ICD-10-CM | POA: Diagnosis present

## 2023-11-24 DIAGNOSIS — I10 Essential (primary) hypertension: Secondary | ICD-10-CM | POA: Insufficient documentation

## 2023-11-24 DIAGNOSIS — Z79899 Other long term (current) drug therapy: Secondary | ICD-10-CM | POA: Insufficient documentation

## 2023-11-24 DIAGNOSIS — Z7982 Long term (current) use of aspirin: Secondary | ICD-10-CM | POA: Insufficient documentation

## 2023-11-24 DIAGNOSIS — N132 Hydronephrosis with renal and ureteral calculous obstruction: Secondary | ICD-10-CM | POA: Diagnosis not present

## 2023-11-24 DIAGNOSIS — E119 Type 2 diabetes mellitus without complications: Secondary | ICD-10-CM | POA: Diagnosis not present

## 2023-11-24 DIAGNOSIS — N200 Calculus of kidney: Secondary | ICD-10-CM

## 2023-11-24 LAB — CBC WITH DIFFERENTIAL/PLATELET
Abs Immature Granulocytes: 0.02 10*3/uL (ref 0.00–0.07)
Basophils Absolute: 0.1 10*3/uL (ref 0.0–0.1)
Basophils Relative: 1 %
Eosinophils Absolute: 0.1 10*3/uL (ref 0.0–0.5)
Eosinophils Relative: 2 %
HCT: 42.2 % (ref 36.0–46.0)
Hemoglobin: 13.8 g/dL (ref 12.0–15.0)
Immature Granulocytes: 0 %
Lymphocytes Relative: 15 %
Lymphs Abs: 1 10*3/uL (ref 0.7–4.0)
MCH: 31.7 pg (ref 26.0–34.0)
MCHC: 32.7 g/dL (ref 30.0–36.0)
MCV: 96.8 fL (ref 80.0–100.0)
Monocytes Absolute: 0.3 10*3/uL (ref 0.1–1.0)
Monocytes Relative: 5 %
Neutro Abs: 4.8 10*3/uL (ref 1.7–7.7)
Neutrophils Relative %: 77 %
Platelets: 229 10*3/uL (ref 150–400)
RBC: 4.36 MIL/uL (ref 3.87–5.11)
RDW: 12.3 % (ref 11.5–15.5)
WBC: 6.3 10*3/uL (ref 4.0–10.5)
nRBC: 0 % (ref 0.0–0.2)

## 2023-11-24 LAB — COMPREHENSIVE METABOLIC PANEL
ALT: 22 U/L (ref 0–44)
AST: 19 U/L (ref 15–41)
Albumin: 4 g/dL (ref 3.5–5.0)
Alkaline Phosphatase: 79 U/L (ref 38–126)
Anion gap: 7 (ref 5–15)
BUN: 16 mg/dL (ref 8–23)
CO2: 28 mmol/L (ref 22–32)
Calcium: 9.3 mg/dL (ref 8.9–10.3)
Chloride: 106 mmol/L (ref 98–111)
Creatinine, Ser: 0.79 mg/dL (ref 0.44–1.00)
GFR, Estimated: >60 mL/min (ref >60)
Glucose, Bld: 148 mg/dL — ABNORMAL HIGH (ref 70–99)
Potassium: 3.6 mmol/L (ref 3.5–5.1)
Sodium: 141 mmol/L (ref 135–145)
Total Bilirubin: 1 mg/dL (ref 0.0–1.2)
Total Protein: 6.9 g/dL (ref 6.5–8.1)

## 2023-11-24 LAB — URINALYSIS, ROUTINE W REFLEX MICROSCOPIC
Bilirubin Urine: NEGATIVE
Glucose, UA: NEGATIVE mg/dL
Ketones, ur: NEGATIVE mg/dL
Nitrite: NEGATIVE
Protein, ur: 30 mg/dL — AB
Specific Gravity, Urine: 1.025 (ref 1.005–1.030)
pH: 6.5 (ref 5.0–8.0)

## 2023-11-24 LAB — URINALYSIS, MICROSCOPIC (REFLEX)

## 2023-11-24 LAB — LIPASE, BLOOD: Lipase: 49 U/L (ref 11–51)

## 2023-11-24 LAB — LACTIC ACID, PLASMA: Lactic Acid, Venous: 0.7 mmol/L (ref 0.5–1.9)

## 2023-11-24 MED ORDER — NAPROXEN 500 MG PO TABS: 500.0000 mg | ORAL_TABLET | Freq: Two times a day (BID) | ORAL | 0 refills | Status: DC

## 2023-11-24 MED ORDER — FENTANYL CITRATE PF 50 MCG/ML IJ SOSY
50.0000 ug | PREFILLED_SYRINGE | Freq: Once | INTRAMUSCULAR | Status: AC
  Administered 2023-11-24: 50 ug via INTRAVENOUS
  Filled 2023-11-24: qty 1

## 2023-11-24 MED ORDER — ONDANSETRON HCL 4 MG/2ML IJ SOLN
4.0000 mg | Freq: Once | INTRAMUSCULAR | Status: AC
  Administered 2023-11-24: 4 mg via INTRAVENOUS
  Filled 2023-11-24: qty 2

## 2023-11-24 MED ORDER — SODIUM CHLORIDE 0.9 % IV BOLUS
1000.0000 mL | Freq: Once | INTRAVENOUS | Status: AC
  Administered 2023-11-24: 1000 mL via INTRAVENOUS

## 2023-11-24 MED ORDER — IOHEXOL 300 MG/ML  SOLN
75.0000 mL | Freq: Once | INTRAMUSCULAR | Status: AC | PRN
  Administered 2023-11-24: 75 mL via INTRAVENOUS

## 2023-11-24 MED ORDER — TAMSULOSIN HCL 0.4 MG PO CAPS: 0.4000 mg | ORAL_CAPSULE | Freq: Every day | ORAL | 0 refills | Status: AC

## 2023-11-24 NOTE — ED Triage Notes (Signed)
 Lower abd pain x 1 hour hx of diverticulitis   some nausea o no diarrhea

## 2023-11-24 NOTE — ED Provider Notes (Signed)
 [name] [facility] Provider Note   CSN: [id number] Arrival date & time: 11/24/23  1017     History  Chief Complaint  Patient presents with   Abdominal Pain    Valerie Gates is a 68 y.o. female.   Abdominal Pain Associated symptoms: chills and nausea      68 year old female with medical history significant for hypertension, anxiety, DM2, diverticulitis who presents to the emergency department with left lower quadrant abdominal pain.  The patient states that around 1 hour prior to arrival she developed severe left lower quadrant abdominal pain, some associated nausea with some chills.  No fevers.  She denies any vomiting.  She denies any genitourinary symptoms.  Pain feels similar to prior episodes of diverticulitis.  She also had an episode of flank pain a week or 2 ago that had resolved overnight and she questions whether she had experienced  passage of  a kidney stone.  Her last bowel movement was this morning and was normal and she is passing gas.  Home Medications Prior to Admission medications   Medication Sig Start Date End Date Taking? Authorizing Provider  naproxen (NAPROSYN) 500 MG tablet Take 1 tablet (500 mg total) by mouth 2 (two) times daily. 11/24/23  Yes Ernie Avena, MD  tamsulosin (FLOMAX) 0.4 MG CAPS capsule Take 1 capsule (0.4 mg total) by mouth daily for 10 days. 11/24/23 12/04/23 Yes Ernie Avena, MD  aspirin EC 81 MG tablet Take 1 tablet (81 mg total) by mouth daily. 10/24/19   Lars Masson, MD  atorvastatin (LIPITOR) 20 MG tablet Take 1 tablet (20 mg total) by mouth daily at 6 PM. 03/16/23   Sharlene Dory, PA-C  fluticasone [facility]) 50 MCG/ACT nasal spray Use 2 spray(s) in each nostril once daily 11/25/21   Panosh, Neta Mends, MD  LORazepam (ATIVAN) 0.5 MG tablet TAKE 1 TABLET (0.5 MG TOTAL) BY MOUTH 2 TIMES DAILY AS NEEDED FOR ANXIETY. CAN TAKE 2 IF NEEDED 10/16/22   Panosh, Neta Mends, MD  losartan (COZAAR) 50 MG tablet Take 1  tablet (50 mg total) by mouth daily. 10/16/22   Panosh, Neta Mends, MD  pantoprazole (PROTONIX) 40 MG tablet Take 1 tablet (40 mg total) by mouth daily before supper. Patient not taking: Reported on 11/05/2023 07/20/23 10/18/23  Iva Boop, MD  Semaglutide (RYBELSUS) 7 MG TABS TAKE 1 TABLET BY MOUTH EVERY DAY FOR DIABETES 04/21/23   Panosh, Neta Mends, MD  valACYclovir (VALTREX) 1000 MG tablet Take 1 tablet (1,000 mg total) by mouth 2 (two) times daily. X 3 days for outbreaks 10/16/22   Panosh, Neta Mends, MD      Allergies    Jardiance [empagliflozin], Metformin, and Metformin and related    Review of Systems   Review of Systems  Constitutional:  Positive for chills.  Gastrointestinal:  Positive for abdominal pain and nausea.  All other systems reviewed and are negative.   Physical Exam Updated Vital Signs BP 131/80   Pulse 79   Temp 97.9 F (36.6 C)   Resp 18   Ht 5\' 4"  (1.626 m)   Wt 61.7 kg   LMP  (LMP Unknown)   SpO2 99%   BMI 23.34 kg/m  Physical Exam Vitals and nursing note reviewed.  Constitutional:      General: She is not in acute distress.    Appearance: She is well-developed.  HENT:     Head: Normocephalic and atraumatic.  Eyes:  Conjunctiva/sclera: Conjunctivae normal.  Cardiovascular:     Rate and Rhythm: Normal rate and regular rhythm.     Heart sounds: No murmur heard. Pulmonary:     Effort: Pulmonary effort is normal. No respiratory distress.     Breath sounds: Normal breath sounds.  Abdominal:     Palpations: Abdomen is soft.     Tenderness: There is abdominal tenderness in the left lower quadrant. There is no guarding or rebound.  Musculoskeletal:        General: No swelling.     Cervical back: Neck supple.  Skin:    General: Skin is warm and dry.     Capillary Refill: Capillary refill takes less than 2 seconds.  Neurological:     Mental Status: She is alert.  Psychiatric:        Mood and Affect: Mood normal.     ED Results / Procedures /  Treatments   Labs (all labs ordered are listed, but only abnormal results are displayed) Labs Reviewed  COMPREHENSIVE METABOLIC PANEL - Abnormal; Notable for the following components:      Result Value   Glucose, Bld 148 (*)    All other components within normal limits  URINALYSIS, ROUTINE W REFLEX MICROSCOPIC - Abnormal; Notable for the following components:   Hgb urine dipstick LARGE (*)    Protein, ur 30 (*)    Leukocytes,Ua TRACE (*)    All other components within normal limits  URINALYSIS, MICROSCOPIC (REFLEX) - Abnormal; Notable for the following components:   Bacteria, UA FEW (*)    All other components within normal limits  CBC WITH DIFFERENTIAL/PLATELET  LIPASE, BLOOD  LACTIC ACID, PLASMA    EKG None  Radiology CT ABDOMEN PELVIS W CONTRAST Result Date: 11/24/2023 CLINICAL DATA:  Left lower quadrant abdominal pain EXAM: CT ABDOMEN AND PELVIS WITH CONTRAST TECHNIQUE: Multidetector CT imaging of the abdomen and pelvis was performed using the standard protocol following bolus administration of intravenous contrast. RADIATION DOSE REDUCTION: This exam was performed according to the departmental dose-optimization program which includes automated exposure control, adjustment of the mA and/or kV according to patient size and/or use of iterative reconstruction technique. CONTRAST:  75mL OMNIPAQUE IOHEXOL 300 MG/ML  SOLN COMPARISON:  CT 05/19/2016 FINDINGS: Lower chest: Bibasilar subsegmental atelectasis. Hepatobiliary: No focal liver abnormality is seen. No gallstones, gallbladder wall thickening, or biliary dilatation. Pancreas: Unremarkable. No pancreatic ductal dilatation or surrounding inflammatory changes. Spleen: Normal in size without focal abnormality. Adrenals/Urinary Tract: Unremarkable adrenal glands. Bilateral renal sinus cysts as well as a 3.0 cm cortical cyst of the right kidney. These do not require follow-up imaging. 4 mm stone within the lower pole of the left kidney. 3 mm  stone within the proximal left ureter. Moderate left hydronephrosis. No right-sided renal stone or hydronephrosis. Urinary bladder within normal limits. Stomach/Bowel: Small hiatal hernia. Stomach otherwise within normal limits. No abnormally dilated loops of bowel. Normal appendix in the right lower quadrant (series 601, image 69). Long segment of borderline-mild wall thickening of the colon spanning from the mid transverse colon to the proximal sigmoid colon. Vascular/Lymphatic: Aortic atherosclerosis. No enlarged abdominal or pelvic lymph nodes. Reproductive: Status post hysterectomy. No adnexal masses. Other: No free fluid. No abdominopelvic fluid collection. No pneumoperitoneum. No abdominal wall hernia. Musculoskeletal: Scoliotic thoracolumbar curvature. No acute osseous abnormality. IMPRESSION: 1. Obstructing 3 mm stone within the proximal left ureter with moderate left hydronephrosis. Additional 4 mm stone within the left kidney. 2. Long segment of borderline-mild wall thickening of the  colon spanning from the mid transverse colon to the proximal sigmoid colon, which may represent a mild colitis. 3. Small hiatal hernia. 4. Aortic atherosclerosis (ICD10-I70.0). Electronically Signed   By: Duanne Guess D.O.   On: 11/24/2023 14:45    Procedures Procedures    Medications Ordered in ED Medications  sodium chloride 0.9 % bolus 1,000 mL (1,000 mLs Intravenous New Bag/Given 11/24/23 1102)  fentaNYL (SUBLIMAZE) injection 50 mcg (50 mcg Intravenous Given 11/24/23 1059)  ondansetron (ZOFRAN) injection 4 mg (4 mg Intravenous Given 11/24/23 1059)  iohexol (OMNIPAQUE) 300 MG/ML solution 75 mL (75 mLs Intravenous Contrast Given 11/24/23 1210)    ED Course/ Medical Decision Making/ A&P                                 Medical Decision Making Amount and/or Complexity of Data Reviewed Labs: ordered. Radiology: ordered.  Risk Prescription drug management.     68 year old female with medical history  significant for hypertension, anxiety, DM2, diverticulitis who presents to the emergency department with left lower quadrant abdominal pain.  The patient states that around 1 hour prior to arrival she developed severe left lower quadrant abdominal pain, some associated nausea with some chills.  No fevers.  She denies any vomiting.  She denies any genitourinary symptoms.  Pain feels similar to prior episodes of diverticulitis.  She also had an episode of flank pain a week or 2 ago that had resolved overnight and she questions whether she had experienced  passage of  a kidney stone.  Her last bowel movement was this morning and was normal and she is passing gas.  On arrival, the patient was vitally stable, not tachycardic, saturating well on room air, BP 148/77.  Physical exam revealed left lower quadrant tenderness to palpation, no rebound or guarding.  Reviewed and confirmed nursing documentation for past medical history, family history, social history.    Initial Assessment:   With the patient's presentation of abdominal pain, most likely diagnosis is diverticulitis. Other diagnoses were considered including (but not limited to) gastroenteritis, colitis, small bowel obstruction, appendicitis, cholecystitis, pancreatitis, nephrolithiasis, UTI, pyelonephritis. These are considered less likely due to history of present illness and physical exam findings.      Initial Plan:  CBC/CMP to evaluate for underlying infectious/metabolic etiology for patient's abdominal pain  Lipase to evaluate for pancreatitis  EKG to evaluate for cardiac source of pain  CTAB/Pelvis with contrast to evaluate for structural/surgical etiology of patients' severe abdominal pain.  Urinalysis and repeat physical assessment to evaluate for UTI/Pyelonpehritis  Empiric management of symptoms with escalating pain control and antiemetics as needed.   Initial Study Results:   Laboratory  All laboratory results reviewed without  evidence of clinically relevant pathology.   Exceptions include: Hematuria present on UA, negative for UTI, serum creatinine normal, no significant electrolyte abnormality    Radiology All images reviewed independently. Agree with radiology report at this time.   CT ABDOMEN PELVIS W CONTRAST Result Date: 11/24/2023 CLINICAL DATA:  Left lower quadrant abdominal pain EXAM: CT ABDOMEN AND PELVIS WITH CONTRAST TECHNIQUE: Multidetector CT imaging of the abdomen and pelvis was performed using the standard protocol following bolus administration of intravenous contrast. RADIATION DOSE REDUCTION: This exam was performed according to the departmental dose-optimization program which includes automated exposure control, adjustment of the mA and/or kV according to patient size and/or use of iterative reconstruction technique. CONTRAST:  75mL OMNIPAQUE IOHEXOL 300 MG/ML  SOLN COMPARISON:  CT 05/19/2016 FINDINGS: Lower chest: Bibasilar subsegmental atelectasis. Hepatobiliary: No focal liver abnormality is seen. No gallstones, gallbladder wall thickening, or biliary dilatation. Pancreas: Unremarkable. No pancreatic ductal dilatation or surrounding inflammatory changes. Spleen: Normal in size without focal abnormality. Adrenals/Urinary Tract: Unremarkable adrenal glands. Bilateral renal sinus cysts as well as a 3.0 cm cortical cyst of the right kidney. These do not require follow-up imaging. 4 mm stone within the lower pole of the left kidney. 3 mm stone within the proximal left ureter. Moderate left hydronephrosis. No right-sided renal stone or hydronephrosis. Urinary bladder within normal limits. Stomach/Bowel: Small hiatal hernia. Stomach otherwise within normal limits. No abnormally dilated loops of bowel. Normal appendix in the right lower quadrant (series 601, image 69). Long segment of borderline-mild wall thickening of the colon spanning from the mid transverse colon to the proximal sigmoid colon. Vascular/Lymphatic:  Aortic atherosclerosis. No enlarged abdominal or pelvic lymph nodes. Reproductive: Status post hysterectomy. No adnexal masses. Other: No free fluid. No abdominopelvic fluid collection. No pneumoperitoneum. No abdominal wall hernia. Musculoskeletal: Scoliotic thoracolumbar curvature. No acute osseous abnormality. IMPRESSION: 1. Obstructing 3 mm stone within the proximal left ureter with moderate left hydronephrosis. Additional 4 mm stone within the left kidney. 2. Long segment of borderline-mild wall thickening of the colon spanning from the mid transverse colon to the proximal sigmoid colon, which may represent a mild colitis. 3. Small hiatal hernia. 4. Aortic atherosclerosis (ICD10-I70.0). Electronically Signed   By: Duanne Guess D.O.   On: 11/24/2023 14:45     .   Final Reassessment and Plan:   On repeat assessment, the patient's pain had completely resolved.  She has no AKI and no evidence of UTI.  I explained to her the diagnosis of obstructing 3 mm stone.  Given the resolution in her pain, I recommended that she follow-up outpatient with urology.  Is possible that she passed this current stone however it was at the proximal ureter at the time of CT imaging.  Explained the patient may have recurrence of pain, recommended outpatient NSAIDs and Flomax and follow-up with urology closely in clinic.  Return precautions provided.   Final Clinical Impression(s) / ED Diagnoses Final diagnoses:  Kidney stone    Rx / DC Orders ED Discharge Orders          Ordered    tamsulosin (FLOMAX) 0.4 MG CAPS capsule  Daily        11/24/23 1506    naproxen (NAPROSYN) 500 MG tablet  2 times daily        11/24/23 1506              Ernie Avena, MD 11/24/23 1512

## 2023-11-24 NOTE — Discharge Instructions (Addendum)
 Your CT scan showed evidence of an obstructing stone: IMPRESSION:  1. Obstructing 3 mm stone within the proximal left ureter with  moderate left hydronephrosis. Additional 4 mm stone within the left  kidney.  2. Long segment of borderline-mild wall thickening of the colon  spanning from the mid transverse colon to the proximal sigmoid  colon, which may represent a mild colitis.  3. Small hiatal hernia.  4. Aortic atherosclerosis (ICD10-I70.0).   Your pain has resolved, you have no evidence of a kidney injury and your urine is not infected.  You are stable for follow-up with urology.

## 2023-11-25 ENCOUNTER — Emergency Department (HOSPITAL_COMMUNITY)
Admission: EM | Admit: 2023-11-25 | Discharge: 2023-11-25 | Disposition: A | Discharge status: Home / Self Care | Attending: Emergency Medicine | Admitting: Emergency Medicine

## 2023-11-25 ENCOUNTER — Ambulatory Visit: Admitting: Physician Assistant

## 2023-11-25 ENCOUNTER — Encounter (HOSPITAL_COMMUNITY): Payer: Self-pay

## 2023-11-25 DIAGNOSIS — Z7982 Long term (current) use of aspirin: Secondary | ICD-10-CM | POA: Diagnosis not present

## 2023-11-25 DIAGNOSIS — R109 Unspecified abdominal pain: Secondary | ICD-10-CM | POA: Diagnosis present

## 2023-11-25 DIAGNOSIS — I10 Essential (primary) hypertension: Secondary | ICD-10-CM | POA: Diagnosis not present

## 2023-11-25 DIAGNOSIS — Z79899 Other long term (current) drug therapy: Secondary | ICD-10-CM | POA: Insufficient documentation

## 2023-11-25 DIAGNOSIS — E119 Type 2 diabetes mellitus without complications: Secondary | ICD-10-CM | POA: Insufficient documentation

## 2023-11-25 DIAGNOSIS — N200 Calculus of kidney: Secondary | ICD-10-CM | POA: Insufficient documentation

## 2023-11-25 LAB — BASIC METABOLIC PANEL
Anion gap: 9 (ref 5–15)
BUN: 12 mg/dL (ref 8–23)
CO2: 24 mmol/L (ref 22–32)
Calcium: 9 mg/dL (ref 8.9–10.3)
Chloride: 109 mmol/L (ref 98–111)
Creatinine, Ser: 0.88 mg/dL (ref 0.44–1.00)
GFR, Estimated: >60 mL/min (ref >60)
Glucose, Bld: 160 mg/dL — ABNORMAL HIGH (ref 70–99)
Potassium: 4 mmol/L (ref 3.5–5.1)
Sodium: 142 mmol/L (ref 135–145)

## 2023-11-25 LAB — CBC
HCT: 39.6 % (ref 36.0–46.0)
Hemoglobin: 12.9 g/dL (ref 12.0–15.0)
MCH: 32.1 pg (ref 26.0–34.0)
MCHC: 32.6 g/dL (ref 30.0–36.0)
MCV: 98.5 fL (ref 80.0–100.0)
Platelets: 184 10*3/uL (ref 150–400)
RBC: 4.02 MIL/uL (ref 3.87–5.11)
RDW: 12.3 % (ref 11.5–15.5)
WBC: 6.1 10*3/uL (ref 4.0–10.5)
nRBC: 0 % (ref 0.0–0.2)

## 2023-11-25 LAB — URINALYSIS, W/ REFLEX TO CULTURE (INFECTION SUSPECTED)
Bilirubin Urine: NEGATIVE
Glucose, UA: NEGATIVE mg/dL
Ketones, ur: NEGATIVE mg/dL
Leukocytes,Ua: NEGATIVE
Nitrite: NEGATIVE
Protein, ur: 30 mg/dL — AB
RBC / HPF: >50 RBC/hpf (ref 0–5)
Specific Gravity, Urine: 1.02 (ref 1.005–1.030)
pH: 5 (ref 5.0–8.0)

## 2023-11-25 MED ORDER — HYDROMORPHONE HCL 1 MG/ML IJ SOLN
1.0000 mg | Freq: Once | INTRAMUSCULAR | Status: AC
  Administered 2023-11-25: 1 mg via INTRAVENOUS
  Filled 2023-11-25: qty 1

## 2023-11-25 MED ORDER — ONDANSETRON 8 MG PO TBDP: 8.0000 mg | ORAL_TABLET | Freq: Three times a day (TID) | ORAL | 0 refills | Status: DC | PRN

## 2023-11-25 MED ORDER — ONDANSETRON HCL 4 MG/2ML IJ SOLN
4.0000 mg | Freq: Once | INTRAMUSCULAR | Status: AC
  Administered 2023-11-25: 4 mg via INTRAVENOUS
  Filled 2023-11-25: qty 2

## 2023-11-25 MED ORDER — OXYCODONE-ACETAMINOPHEN 5-325 MG PO TABS: 1.0000 | ORAL_TABLET | Freq: Four times a day (QID) | ORAL | 0 refills | Status: DC | PRN

## 2023-11-25 NOTE — ED Triage Notes (Signed)
 Patient with continued L flank pain and emesis since being dx with two kidney stones yesterday. Emesis x 2 today.

## 2023-11-25 NOTE — ED Provider Notes (Signed)
 [place] EMERGENCY DEPARTMENT AT [facility] Provider Note   CSN: [id number] Arrival date & time: 11/25/23  4098     History {Add pertinent medical, surgical, social history, OB history to HPI:1} Chief Complaint  Patient presents with   Flank Pain    Valerie Gates is a 68 y.o. female.   Flank Pain     Patient has a history of hypertension arthritis diabetes.  She presents to the ED for evaluation of persistent flank pain and vomiting.  Patient was seen yesterday at [facility] emergency room.  Patient started having left abdominal and flank pain acutely yesterday morning.  Patient's laboratory tests were notable for hematuria without signs of infection.  She had a CT scan her abdomen pelvis that showed a 3 mm obstructing left proximal ureteral stone.  Patient also had an additional 4 mm stone within the kidney.  There is question of a possible mild colitis.  Patient states she was feeling better on discharge yesterday.  When she woke up this morning she had more acute pain in the left flank associated with nausea and vomiting.  Patient tried taking Naprosyn without relief  Home Medications Prior to Admission medications   Medication Sig Start Date End Date Taking? Authorizing Provider  aspirin EC 81 MG tablet Take 1 tablet (81 mg total) by mouth daily. 10/24/19   Lars Masson, MD  atorvastatin (LIPITOR) 20 MG tablet Take 1 tablet (20 mg total) by mouth daily at 6 PM. 03/16/23   Sharlene Dory, PA-C  fluticasone [facility]) 50 MCG/ACT nasal spray Use 2 spray(s) in each nostril once daily 11/25/21   Panosh, Neta Mends, MD  LORazepam (ATIVAN) 0.5 MG tablet TAKE 1 TABLET (0.5 MG TOTAL) BY MOUTH 2 TIMES DAILY AS NEEDED FOR ANXIETY. CAN TAKE 2 IF NEEDED 10/16/22   Panosh, Neta Mends, MD  losartan (COZAAR) 50 MG tablet Take 1 tablet (50 mg total) by mouth daily. 10/16/22   Panosh, Neta Mends, MD  naproxen (NAPROSYN) 500 MG tablet Take 1 tablet (500 mg total) by mouth 2 (two) times  daily. 11/24/23   Ernie Avena, MD  pantoprazole (PROTONIX) 40 MG tablet Take 1 tablet (40 mg total) by mouth daily before supper. Patient not taking: Reported on 11/05/2023 07/20/23 10/18/23  Iva Boop, MD  Semaglutide (RYBELSUS) 7 MG TABS TAKE 1 TABLET BY MOUTH EVERY DAY FOR DIABETES 04/21/23   Panosh, Neta Mends, MD  tamsulosin (FLOMAX) 0.4 MG CAPS capsule Take 1 capsule (0.4 mg total) by mouth daily for 10 days. 11/24/23 12/04/23  Ernie Avena, MD  valACYclovir (VALTREX) 1000 MG tablet Take 1 tablet (1,000 mg total) by mouth 2 (two) times daily. X 3 days for outbreaks 10/16/22   Panosh, Neta Mends, MD      Allergies    Jardiance [empagliflozin], Metformin, and Metformin and related    Review of Systems   Review of Systems  Genitourinary:  Positive for flank pain.    Physical Exam Updated Vital Signs BP (!) 146/82 (BP Location: Right Arm)   Pulse 75   Temp 98.2 F (36.8 C)   Resp 16   LMP  (LMP Unknown)   SpO2 100%  Physical Exam Vitals and nursing note reviewed.  Constitutional:      General: She is not in acute distress.    Appearance: She is well-developed.  HENT:     Head: Normocephalic and atraumatic.     Right Ear: External ear normal.  Left Ear: External ear normal.  Eyes:     General: No scleral icterus.       Right eye: No discharge.        Left eye: No discharge.     Conjunctiva/sclera: Conjunctivae normal.  Neck:     Trachea: No tracheal deviation.  Cardiovascular:     Rate and Rhythm: Normal rate and regular rhythm.  Pulmonary:     Effort: Pulmonary effort is normal. No respiratory distress.     Breath sounds: Normal breath sounds. No stridor. No wheezing or rales.  Abdominal:     General: Bowel sounds are normal. There is no distension.     Palpations: Abdomen is soft.     Tenderness: There is no abdominal tenderness. There is left CVA tenderness. There is no guarding or rebound.  Musculoskeletal:        General: No tenderness or deformity.      Cervical back: Neck supple.  Skin:    General: Skin is warm and dry.     Findings: No rash.  Neurological:     General: No focal deficit present.     Mental Status: She is alert.     Cranial Nerves: No cranial nerve deficit, dysarthria or facial asymmetry.     Sensory: No sensory deficit.     Motor: No abnormal muscle tone or seizure activity.     Coordination: Coordination normal.  Psychiatric:        Mood and Affect: Mood normal.     ED Results / Procedures / Treatments   Labs (all labs ordered are listed, but only abnormal results are displayed) Labs Reviewed - No data to display  EKG None  Radiology CT ABDOMEN PELVIS W CONTRAST Result Date: 11/24/2023 CLINICAL DATA:  Left lower quadrant abdominal pain EXAM: CT ABDOMEN AND PELVIS WITH CONTRAST TECHNIQUE: Multidetector CT imaging of the abdomen and pelvis was performed using the standard protocol following bolus administration of intravenous contrast. RADIATION DOSE REDUCTION: This exam was performed according to the departmental dose-optimization program which includes automated exposure control, adjustment of the mA and/or kV according to patient size and/or use of iterative reconstruction technique. CONTRAST:  75mL OMNIPAQUE IOHEXOL 300 MG/ML  SOLN COMPARISON:  CT 05/19/2016 FINDINGS: Lower chest: Bibasilar subsegmental atelectasis. Hepatobiliary: No focal liver abnormality is seen. No gallstones, gallbladder wall thickening, or biliary dilatation. Pancreas: Unremarkable. No pancreatic ductal dilatation or surrounding inflammatory changes. Spleen: Normal in size without focal abnormality. Adrenals/Urinary Tract: Unremarkable adrenal glands. Bilateral renal sinus cysts as well as a 3.0 cm cortical cyst of the right kidney. These do not require follow-up imaging. 4 mm stone within the lower pole of the left kidney. 3 mm stone within the proximal left ureter. Moderate left hydronephrosis. No right-sided renal stone or hydronephrosis.  Urinary bladder within normal limits. Stomach/Bowel: Small hiatal hernia. Stomach otherwise within normal limits. No abnormally dilated loops of bowel. Normal appendix in the right lower quadrant (series 601, image 69). Long segment of borderline-mild wall thickening of the colon spanning from the mid transverse colon to the proximal sigmoid colon. Vascular/Lymphatic: Aortic atherosclerosis. No enlarged abdominal or pelvic lymph nodes. Reproductive: Status post hysterectomy. No adnexal masses. Other: No free fluid. No abdominopelvic fluid collection. No pneumoperitoneum. No abdominal wall hernia. Musculoskeletal: Scoliotic thoracolumbar curvature. No acute osseous abnormality. IMPRESSION: 1. Obstructing 3 mm stone within the proximal left ureter with moderate left hydronephrosis. Additional 4 mm stone within the left kidney. 2. Long segment of borderline-mild wall thickening of the colon  spanning from the mid transverse colon to the proximal sigmoid colon, which may represent a mild colitis. 3. Small hiatal hernia. 4. Aortic atherosclerosis (ICD10-I70.0). Electronically Signed   By: Duanne Guess D.O.   On: 11/24/2023 14:45    Procedures Procedures  {Document cardiac monitor, telemetry assessment procedure when appropriate:1}  Medications Ordered in ED Medications  HYDROmorphone (DILAUDID) injection 1 mg (has no administration in time range)  ondansetron (ZOFRAN) injection 4 mg (has no administration in time range)    ED Course/ Medical Decision Making/ A&P   {   Click here for ABCD2, HEART and other calculatorsREFRESH Note before signing :1}                              Medical Decision Making Amount and/or Complexity of Data Reviewed Labs: ordered.  Risk Prescription drug management.   ***  {Document critical care time when appropriate:1} {Document review of labs and clinical decision tools ie heart score, Chads2Vasc2 etc:1}  {Document your independent review of radiology images,  and any outside records:1} {Document your discussion with family members, caretakers, and with consultants:1} {Document social determinants of health affecting pt's care:1} {Document your decision making why or why not admission, treatments were needed:1} Final Clinical Impression(s) / ED Diagnoses Final diagnoses:  None    Rx / DC Orders ED Discharge Orders     None

## 2023-11-25 NOTE — Discharge Instructions (Signed)
 Take the medications to help with pain and nausea.  Follow-up with urologist tomorrow as planned.  Return to [facility] ED for worsening symptoms

## 2023-12-09 ENCOUNTER — Other Ambulatory Visit: Payer: Self-pay | Admitting: Internal Medicine

## 2023-12-16 ENCOUNTER — Ambulatory Visit
Admission: RE | Admit: 2023-12-16 | Discharge: 2023-12-16 | Disposition: A | Discharge status: Home / Self Care | Source: Ambulatory Visit | Attending: Internal Medicine | Admitting: Internal Medicine

## 2023-12-16 DIAGNOSIS — Z1231 Encounter for screening mammogram for malignant neoplasm of breast: Secondary | ICD-10-CM

## 2024-02-07 ENCOUNTER — Other Ambulatory Visit: Payer: Self-pay | Admitting: Internal Medicine

## 2024-03-06 ENCOUNTER — Other Ambulatory Visit: Payer: Self-pay | Admitting: Physician Assistant

## 2024-03-06 DIAGNOSIS — R011 Cardiac murmur, unspecified: Secondary | ICD-10-CM

## 2024-03-06 DIAGNOSIS — E785 Hyperlipidemia, unspecified: Secondary | ICD-10-CM

## 2024-03-08 ENCOUNTER — Ambulatory Visit (INDEPENDENT_AMBULATORY_CARE_PROVIDER_SITE_OTHER): Admitting: Family Medicine

## 2024-03-08 DIAGNOSIS — Z0000 Encounter for general adult medical examination without abnormal findings: Secondary | ICD-10-CM | POA: Diagnosis not present

## 2024-03-08 NOTE — Progress Notes (Signed)
 PATIENT CHECK-IN and HEALTH RISK ASSESSMENT QUESTIONNAIRE:  -completed by phone/video for upcoming Medicare Preventive Visit   Pre-Visit Check-in: 1)Vitals (height, wt, BP, etc) - record in vitals section for visit on day of visit Request home vitals (wt, BP, etc.) and enter into vitals, THEN update Vital Signs SmartPhrase below at the top of the HPI. See below.  2)Review and Update Medications, Allergies PMH, Surgeries, Social history in Epic 3)Hospitalizations in the last year with date/reason? n  4)Review and Update Care Team (patient's specialists) in Epic 5) Complete PHQ9 in Epic  6) Complete Fall Screening in Epic 7)Review all Health Maintenance Due and order under PCP if not done.  Medicare Wellness Patient Questionnaire:  Answer theses question about your habits: How often do you have a drink containing alcohol?n How many drinks containing alcohol do you have on a typical day when you are drinking?na How often do you have six or more drinks on one occasion?na Have you ever smoked?y Quit date if applicable? Quit over 30 year ago  How many packs a day do/did you smoke? ppd Do you use smokeless tobacco?n Do you use an illicit drugs?n On average, how many days per week do you engage in moderate to strenuous exercise (like a brisk walk)? Yoga at least once a week, walks 4-5 days a week Typical diet: combination of eating out and cooking, lots of salads and chicken, some beef - hamburgers and steak, seasonal fruit  Beverages: water  Answer theses question about your everyday activities: Can you perform most household chores?y Are you deaf or have significant trouble hearing?n Do you feel that you have a problem with memory?n Do you feel safe at home?y Last dentist visit? 2x per year 8. Do you have any difficulty performing your everyday activities?n Are you having any difficulty walking, taking medications on your own, and or difficulty managing daily home needs?n Do you have  difficulty walking or climbing stairs?n Do you have difficulty dressing or bathing?n Do you have difficulty doing errands alone such as visiting a doctor's office or shopping?n Do you currently have any difficulty preparing food and eating?n Do you currently have any difficulty using the toilet?n Do you have any difficulty managing your finances?n Do you have any difficulties with housekeeping of managing your housekeeping?n   Do you have Advanced Directives in place (Living Will, Healthcare Power or Attorney)? HCPOA   Last eye Exam and location?[facility]   Do you currently use prescribed or non-prescribed narcotic or opioid pain medications? no  Do you have a history or close family history of breast, ovarian, tubal or peritoneal cancer or a family member with BRCA (breast cancer susceptibility 1 and 2) gene mutations?     ----------------------------------------------------------------------------------------------------------------------------------------------------------------------------------------------------------------------  Because this visit was a virtual/telehealth visit, some criteria may be missing or patient reported. Any vitals not documented were not able to be obtained and vitals that have been documented are patient reported.    MEDICARE ANNUAL PREVENTIVE VISIT WITH PROVIDER: (Welcome to Medicare, initial annual wellness or annual wellness exam)  Virtual Visit via Video Note  I connected with Valerie Gates on 03/08/24 by a video enabled telemedicine application and verified that I am speaking with the correct person using two identifiers.  Location patient: home Location provider:work or home office Persons participating in the virtual visit: patient, provider  Concerns and/or follow up today: doing well, seeing urologist for kidney stone - ok now.    See HM section in Epic for other details of  completed HM.    ROS: negative for report of fevers,  unintentional weight loss, vision changes, vision loss, hearing loss or change, chest pain, sob, hemoptysis, melena, hematochezia, hematuria, falls, bleeding or bruising, thoughts of suicide or self harm, memory loss  Patient-completed extensive health risk assessment - reviewed and discussed with the patient: See Health Risk Assessment completed with patient prior to the visit either above or in recent phone note. This was reviewed in detailed with the patient today and appropriate recommendations, orders and referrals were placed as needed per Summary below and patient instructions.   Review of Medical History: -PMH, PSH, Family History and current specialty and care providers reviewed and updated and listed below   Patient Care Team: Panosh, Apolinar POUR, MD as PCP - General Maranda Leim DEL, MD as PCP - Cardiology (Cardiology) Lomax, Carlin ORN, MD (Inactive) (Obstetrics and Gynecology) Rutherford Gain, MD as Consulting Physician (Obstetrics and Gynecology) Cary Doffing, MD as Consulting Physician (Dermatology) Leslee Reusing, MD as Consulting Physician (Ophthalmology)   Past Medical History:  Diagnosis Date   Allergic rhinitis    Allergy    seasonal   Anxiety    Arthritis    lower right back    Cancer [facility])    skin cancer-scalp   Diabetes mellitus without complication (HCC)    History of skin cancer    Hx of adenomatous polyp of colon 03/17/2018   Hypertension    Osteopenia     Past Surgical History:  Procedure Laterality Date   BLADDER SURGERY     COLONOSCOPY     skin cancer removed on head      Social History   Socioeconomic History   Marital status: Divorced    Spouse name: Not on file   Number of children: Not on file   Years of education: Not on file   Highest education level: Not on file  Occupational History   Not on file  Tobacco Use   Smoking status: Former   Smokeless tobacco: Never   Tobacco comments:    Quit 35 years ago  Vaping Use    Vaping status: Never Used  Substance and Sexual Activity   Alcohol use: Yes    Comment: occ   Drug use: No   Sexual activity: Not on file  Other Topics Concern   Not on file  Social History Narrative   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   Has bf    Regular exercise- yes   HH of 2   1pet dogs   Daughter SUD hx  IVDU and incarderation     Social Drivers of Corporate investment banker Strain: Not on file  Food Insecurity: Not on file  Transportation Needs: Not on file  Physical Activity: Sufficiently Active (01/15/2023)   Exercise Vital Sign    Days of Exercise per Week: 7 days    Minutes of Exercise per Session: 30 min  Stress: Not on file  Social Connections: Not on file  Intimate Partner Violence: Not on file    Family History  Problem Relation Age of Onset   Colon polyps Mother    Liver disease Sister    Pancreatic cancer Sister    Drug abuse Daughter    Nephrolithiasis Daughter    Diabetes Other    Hypertension Other    Lung cancer Other    Stroke Other        1st degree female <50   Ulcerative colitis  Half-Sister    Colon cancer Neg Hx    Rectal cancer Neg Hx    Stomach cancer Neg Hx    Esophageal cancer Neg Hx    Breast cancer Neg Hx     Current Outpatient Medications on File Prior to Visit  Medication Sig Dispense Refill   aspirin  EC 81 MG tablet Take 1 tablet (81 mg total) by mouth daily.     atorvastatin  (LIPITOR) 20 MG tablet Take 1 tablet (20 mg total) by mouth daily at 6 PM. 90 tablet 3   fluticasone  (FLONASE ) 50 MCG/ACT nasal spray Use 2 spray(s) in each nostril once daily 16 g 0   LORazepam  (ATIVAN ) 0.5 MG tablet TAKE 1 TABLET (0.5 MG TOTAL) BY MOUTH 2 TIMES DAILY AS NEEDED FOR ANXIETY. CAN TAKE 2 IF NEEDED 30 tablet 0   losartan  (COZAAR ) 50 MG tablet TAKE 1 TABLET BY MOUTH EVERY DAY 90 tablet 3   RYBELSUS  7 MG TABS TAKE 1 TABLET BY MOUTH EVERY DAY FOR DIABETES 30 tablet 6   valACYclovir  (VALTREX ) 1000 MG tablet Take 1  tablet (1,000 mg total) by mouth 2 (two) times daily. X 3 days for outbreaks 20 tablet 3   No current facility-administered medications on file prior to visit.    Allergies  Allergen Reactions   Jardiance  [Empagliflozin ]     Causes yeast infection per patient   Metformin  Diarrhea   Metformin  And Related Diarrhea       Physical Exam Vitals requested from patient and listed below if patient had equipment and was able to obtain at home for this virtual visit: There were no vitals filed for this visit. Estimated body mass index is 23.34 kg/m as calculated from the following:   Height as of 11/24/23: 5' 4 (1.626 m).   Weight as of 11/24/23: 136 lb (61.7 kg).  EKG (optional): deferred due to virtual visit  GENERAL: alert, oriented, no acute distress detected, full vision exam deferred due to pandemic and/or virtual encounter  HEENT: atraumatic, conjunttiva clear, no obvious abnormalities on inspection of external nose and ears  NECK: normal movements of the head and neck  LUNGS: on inspection no signs of respiratory distress, breathing rate appears normal, no obvious gross SOB, gasping or wheezing  CV: no obvious cyanosis  MS: moves all visible extremities without noticeable abnormality  PSYCH/NEURO: pleasant and cooperative, no obvious depression or anxiety, speech and thought processing grossly intact, Cognitive function grossly intact  Flowsheet Row Office Visit from [facility]/21/2023 in [facility]  PHQ-9 Total Score 0        03/08/2024    5:43 PM 04/21/2023    9:06 AM 10/16/2022    8:48 AM 12/17/2021   10:03 AM 12/17/2021    9:47 AM  Depression screen PHQ 2/9  Decreased Interest 0 0 0 0 0  Down, Depressed, Hopeless 0 0 0 0 0  PHQ - 2 Score 0 0 0 0 0  Altered sleeping  0 0 0 0  Tired, decreased energy  0 0 0 0  Change in appetite  0 0 0 0  Feeling bad or failure about yourself   0 0 0 0  Trouble concentrating  0 0 0 0  Moving slowly or  fidgety/restless  0 0 0 0  Suicidal thoughts  0 0  0  PHQ-9 Score  0 0 0 0  Difficult doing work/chores    Not difficult at all Not difficult at all  12/17/2021    9:47 AM 10/16/2022    8:47 AM 04/21/2023    9:06 AM 11/05/2023   11:14 AM 03/08/2024    5:43 PM  Fall Risk  Falls in the past year? 0 0 0 0 0  Was there an injury with Fall? 0 0 0 0 0  Fall Risk Category Calculator 0 0 0 0 0  Fall Risk Category (Retired) Low       (RETIRED) Patient Fall Risk Level Low fall risk       Patient at Risk for Falls Due to No Fall Risks No Fall Risks No Fall Risks No Fall Risks   Fall risk Follow up Falls evaluation completed  Falls evaluation completed Falls evaluation completed Falls evaluation completed Falls evaluation completed     Data saved with a previous flowsheet row definition     SUMMARY AND PLAN:  Encounter for Medicare annual wellness exam  Discussed applicable health maintenance/preventive health measures and advised and referred or ordered per patient preferences: -discussed vaccines due and advised can get at the pharmacy -discussed bone density, she does with Dr. Rutherford and plans to request there, discussed bone building exercise, adequate vit D and calcium  - delicate balance with kidney stones.  Health Maintenance  Topic Date Due   Hepatitis C Screening  Never done   Pneumococcal Vaccine: 50+ Years (1 of 2 - PCV) Never done   COVID-19 Vaccine (3 - Mixed Product risk series) 01/06/2020   INFLUENZA VACCINE  04/01/2024   HEMOGLOBIN A1C  05/12/2024   OPHTHALMOLOGY EXAM  09/08/2024   FOOT EXAM  11/04/2024   Diabetic kidney evaluation - Urine ACR  11/09/2024   Diabetic kidney evaluation - eGFR measurement  11/24/2024   Medicare Annual Wellness (AWV)  03/08/2025   MAMMOGRAM  12/15/2025   Colonoscopy  07/19/2033   DEXA SCAN  Completed   Zoster Vaccines- Shingrix  Completed   Hepatitis B Vaccines  Aged Out   HPV VACCINES  Aged Out   Meningococcal B Vaccine  Aged Out    DTaP/Tdap/Td  Discontinued      Education and counseling on the following was provided based on the above review of health and a plan/checklist for the patient, along with additional information discussed, was provided for the patient in the patient instructions :   -Advised and counseled on a healthy lifestyle - including the importance of a healthy diet, regular physical activity -Reviewed patient's current diet. Advised and counseled on a whole foods based healthy diet. A summary of a healthy diet was provided in the Patient Instructions.  -reviewed patient's current physical activity level and discussed exercise guidelines for adults. Ptovided community resources and ideas for safe exercise at home to assist in meeting exercise guideline recommendations in a safe and healthy way - see patient instructions -Advise yearly dental visits at minimum and regular eye exams   Follow up: see patient instructions     Patient Instructions  I really enjoyed getting to talk with you today! I am available on Tuesdays and Thursdays for virtual visits if you have any questions or concerns, or if I can be of any further assistance.   CHECKLIST FROM ANNUAL WELLNESS VISIT:  -Follow up (please call to schedule if not scheduled after visit):   -yearly for annual wellness visit with primary care office  Here is a list of your preventive care/health maintenance measures and the plan for each if any are due:  PLAN For any measures below that may be  due:    1. You are due for a bone density test. Please let us  know if you would like us  to order if for any reason you are unable to complete with Dr. Rutherford.    2. Can get vaccines at the pharmacy. If you do, please let us  know so that we can update your record.   Health Maintenance  Topic Date Due   Hepatitis C Screening  Never done   Pneumococcal Vaccine: 50+ Years (1 of 2 - PCV) Never done   COVID-19 Vaccine (3 - Mixed Product risk series)  01/06/2020   INFLUENZA VACCINE  04/01/2024   HEMOGLOBIN A1C  05/12/2024   OPHTHALMOLOGY EXAM  09/08/2024   FOOT EXAM  11/04/2024   Diabetic kidney evaluation - Urine ACR  11/09/2024   Diabetic kidney evaluation - eGFR measurement  11/24/2024   Medicare Annual Wellness (AWV)  03/08/2025   MAMMOGRAM  12/15/2025   Colonoscopy  07/19/2033   DEXA SCAN  Completed   Zoster Vaccines- Shingrix  Completed   Hepatitis B Vaccines  Aged Out   HPV VACCINES  Aged Out   Meningococcal B Vaccine  Aged Out   DTaP/Tdap/Td  Discontinued    -See a dentist at least yearly  -Get your eyes checked and then per your eye specialist's recommendations  -Other issues addressed today:   -I have included below further information regarding a healthy whole foods based diet, physical activity guidelines for adults, stress management and opportunities for social connections. I hope you find this information useful.   -----------------------------------------------------------------------------------------------------------------------------------------------------------------------------------------------------------------------------------------------------------    NUTRITION: -eat real food: lots of colorful vegetables (half the plate) and fruits -5-7 servings of vegetables and fruits per day (fresh or steamed is best), exp. 2 servings of vegetables with lunch and dinner and 2 servings of fruit per day. Berries and greens such as kale and collards are great choices.  -consume on a regular basis:  fresh fruits, fresh veggies, fish, nuts, seeds, healthy oils (such as olive oil, avocado oil), whole grains (make sure for bread/pasta/crackers/etc., that the first ingredient on label contains the word whole), legumes. -can eat small amounts of dairy and lean meat (no larger than the palm of your hand), but avoid processed meats such as ham, bacon, lunch meat, etc. -drink water -try to avoid fast food and  pre-packaged foods, processed meat, ultra processed foods/beverages (donuts, candy, etc.) -most experts advise limiting sodium to < 2300mg  per day, should limit further is any chronic conditions such as high blood pressure, heart disease, diabetes, etc. The American Heart Association advised that < 1500mg  is is ideal -try to avoid foods/beverages that contain any ingredients with names you do not recognize  -try to avoid foods/beverages  with added sugar or sweeteners/sweets  -try to avoid sweet drinks (including diet drinks): soda, juice, Gatorade, sweet tea, power drinks, diet drinks -try to avoid white rice, white bread, pasta (unless whole grain)  EXERCISE GUIDELINES FOR ADULTS: -if you wish to increase your physical activity, do so gradually and with the approval of your doctor -STOP and seek medical care immediately if you have any chest pain, chest discomfort or trouble breathing when starting or increasing exercise  -move and stretch your body, legs, feet and arms when sitting for long periods -Physical activity guidelines for optimal health in adults: -get at least 150 minutes per week of moderate exercise (can talk, but not sing); this is about 20-30 minutes of sustained activity 5-7 days per week or two 10-15 minute episodes  of sustained activity 5-7 days per week -do some muscle building/resistance training/strength training at least 2 days per week  -balance exercises 3+ days per week:   Stand somewhere where you have something sturdy to hold onto if you lose balance    1) lift up on toes, then back down, start with 5x per day and work up to 20x   2) stand and lift one leg straight out to the side so that foot is a few inches of the floor, start with 5x each side and work up to 20x each side   3) stand on one foot, start with 5 seconds each side and work up to 20 seconds on each side  If you need ideas or help with getting more active:  -Silver  sneakers https://tools.silversneakers.com  -Walk with a Doc: http://www.duncan-williams.com/  -try to include resistance (weight lifting/strength building) and balance exercises twice per week: or the following link for ideas: http://castillo-powell.com/  BuyDucts.dk  STRESS MANAGEMENT: -can try meditating, or just sitting quietly with deep breathing while intentionally relaxing all parts of your body for 5 minutes daily -if you need further help with stress, anxiety or depression please follow up with your primary doctor or contact the wonderful folks at [facility]: [phone]  SOCIAL CONNECTIONS: -options in [name] if you wish to engage in more social and exercise related activities:  -Silver sneakers https://tools.silversneakers.com  -Walk with a Doc: http://www.duncan-williams.com/  -Check out the [facility] Active Adults 50+ section on the [place] of [employer] (hiking clubs, book clubs, cards and games, chess, exercise classes, aquatic classes and much more) - see the website for details: https://www.[place].gov/departments/parks-recreation/active-adults50  -YouTube has lots of exercise videos for different ages and abilities as well  -[facility] (a variety of indoor and outdoor inperson activities for adults). 6501399383. [address]  -Virtual Online Classes (a variety of topics): see seniorplanet.org or call [phone]  -consider volunteering at a school, hospice center, church, senior center or elsewhere            Chiquita JONELLE Cramp, DO

## 2024-03-08 NOTE — Patient Instructions (Signed)
 I really enjoyed getting to talk with you today! I am available on Tuesdays and Thursdays for virtual visits if you have any questions or concerns, or if I can be of any further assistance.   CHECKLIST FROM ANNUAL WELLNESS VISIT:  -Follow up (please call to schedule if not scheduled after visit):   -yearly for annual wellness visit with primary care office  Here is a list of your preventive care/health maintenance measures and the plan for each if any are due:  PLAN For any measures below that may be due:    1. You are due for a bone density test. Please let us  know if you would like us  to order if for any reason you are unable to complete with Dr. Rutherford.    2. Can get vaccines at the pharmacy. If you do, please let us  know so that we can update your record.   Health Maintenance  Topic Date Due   Hepatitis C Screening  Never done   Pneumococcal Vaccine: 50+ Years (1 of 2 - PCV) Never done   COVID-19 Vaccine (3 - Mixed Product risk series) 01/06/2020   INFLUENZA VACCINE  04/01/2024   HEMOGLOBIN A1C  05/12/2024   OPHTHALMOLOGY EXAM  09/08/2024   FOOT EXAM  11/04/2024   Diabetic kidney evaluation - Urine ACR  11/09/2024   Diabetic kidney evaluation - eGFR measurement  11/24/2024   Medicare Annual Wellness (AWV)  03/08/2025   MAMMOGRAM  12/15/2025   Colonoscopy  07/19/2033   DEXA SCAN  Completed   Zoster Vaccines- Shingrix  Completed   Hepatitis B Vaccines  Aged Out   HPV VACCINES  Aged Out   Meningococcal B Vaccine  Aged Out   DTaP/Tdap/Td  Discontinued    -See a dentist at least yearly  -Get your eyes checked and then per your eye specialist's recommendations  -Other issues addressed today:   -I have included below further information regarding a healthy whole foods based diet, physical activity guidelines for adults, stress management and opportunities for social connections. I hope you find this information useful.    -----------------------------------------------------------------------------------------------------------------------------------------------------------------------------------------------------------------------------------------------------------    NUTRITION: -eat real food: lots of colorful vegetables (half the plate) and fruits -5-7 servings of vegetables and fruits per day (fresh or steamed is best), exp. 2 servings of vegetables with lunch and dinner and 2 servings of fruit per day. Berries and greens such as kale and collards are great choices.  -consume on a regular basis:  fresh fruits, fresh veggies, fish, nuts, seeds, healthy oils (such as olive oil, avocado oil), whole grains (make sure for bread/pasta/crackers/etc., that the first ingredient on label contains the word whole), legumes. -can eat small amounts of dairy and lean meat (no larger than the palm of your hand), but avoid processed meats such as ham, bacon, lunch meat, etc. -drink water -try to avoid fast food and pre-packaged foods, processed meat, ultra processed foods/beverages (donuts, candy, etc.) -most experts advise limiting sodium to < 2300mg  per day, should limit further is any chronic conditions such as high blood pressure, heart disease, diabetes, etc. The American Heart Association advised that < 1500mg  is is ideal -try to avoid foods/beverages that contain any ingredients with names you do not recognize  -try to avoid foods/beverages  with added sugar or sweeteners/sweets  -try to avoid sweet drinks (including diet drinks): soda, juice, Gatorade, sweet tea, power drinks, diet drinks -try to avoid white rice, white bread, pasta (unless whole grain)  EXERCISE GUIDELINES FOR ADULTS: -if you  wish to increase your physical activity, do so gradually and with the approval of your doctor -STOP and seek medical care immediately if you have any chest pain, chest discomfort or trouble breathing when starting or  increasing exercise  -move and stretch your body, legs, feet and arms when sitting for long periods -Physical activity guidelines for optimal health in adults: -get at least 150 minutes per week of moderate exercise (can talk, but not sing); this is about 20-30 minutes of sustained activity 5-7 days per week or two 10-15 minute episodes of sustained activity 5-7 days per week -do some muscle building/resistance training/strength training at least 2 days per week  -balance exercises 3+ days per week:   Stand somewhere where you have something sturdy to hold onto if you lose balance    1) lift up on toes, then back down, start with 5x per day and work up to 20x   2) stand and lift one leg straight out to the side so that foot is a few inches of the floor, start with 5x each side and work up to 20x each side   3) stand on one foot, start with 5 seconds each side and work up to 20 seconds on each side  If you need ideas or help with getting more active:  -Silver sneakers https://tools.silversneakers.com  -Walk with a Doc: http://www.duncan-williams.com/  -try to include resistance (weight lifting/strength building) and balance exercises twice per week: or the following link for ideas: http://castillo-powell.com/  BuyDucts.dk  STRESS MANAGEMENT: -can try meditating, or just sitting quietly with deep breathing while intentionally relaxing all parts of your body for 5 minutes daily -if you need further help with stress, anxiety or depression please follow up with your primary doctor or contact the wonderful folks at [facility]: [phone]  SOCIAL CONNECTIONS: -options in [place] if you wish to engage in more social and exercise related activities:  -Silver sneakers https://tools.silversneakers.com  -Walk with a Doc: http://www.duncan-williams.com/  -Check out the [facility] Active Adults 50+  section on the [place] [employer] (hiking clubs, book clubs, cards and games, chess, exercise classes, aquatic classes and much more) - see the website for details: https://www.[place].gov/departments/parks-recreation/active-adults50  -YouTube has lots of exercise videos for different ages and abilities as well  -[facility] (a variety of indoor and outdoor inperson activities for adults). [phone]. [address].  -Virtual Online Classes (a variety of topics): see seniorplanet.org or call [phone]  -consider volunteering at a school, hospice center, church, senior center or elsewhere

## 2024-03-09 NOTE — Telephone Encounter (Signed)
*  STAT* If patient is at the pharmacy, call can be transferred to refill team.   1. Which medications need to be refilled? (please list name of each medication and dose if known) Atorvastatin    2. Would you like to learn more about the convenience, safety, & potential cost savings by using the [facility]?    3. Are you open to using the [facility] (Type [facility].    4. Which pharmacy/location (including street and city if local pharmacy) is medication to be sent to? [facility] [place]   5. Do they need a 30 day or 90 day supply?  90 days and refills- e

## 2024-03-31 ENCOUNTER — Other Ambulatory Visit: Payer: Self-pay | Admitting: Physician Assistant

## 2024-03-31 DIAGNOSIS — E785 Hyperlipidemia, unspecified: Secondary | ICD-10-CM

## 2024-03-31 DIAGNOSIS — R011 Cardiac murmur, unspecified: Secondary | ICD-10-CM

## 2024-04-21 ENCOUNTER — Telehealth: Payer: Self-pay | Admitting: Physician Assistant

## 2024-04-21 DIAGNOSIS — E785 Hyperlipidemia, unspecified: Secondary | ICD-10-CM

## 2024-04-21 DIAGNOSIS — R011 Cardiac murmur, unspecified: Secondary | ICD-10-CM

## 2024-04-21 MED ORDER — ATORVASTATIN CALCIUM 20 MG PO TABS: 20.0000 mg | ORAL_TABLET | Freq: Every day | ORAL | 0 refills | Status: DC

## 2024-04-21 NOTE — Telephone Encounter (Signed)
*  STAT* If patient is at the pharmacy, call can be transferred to refill team.   1. Which medications need to be refilled? (please list name of each medication and dose if known)   atorvastatin  (LIPITOR) 20 MG tablet   2. Would you like to learn more about the convenience, safety, & potential cost savings by using the [facility]?   3. Are you open to using the [facility] (Type [facility]. ).   4. Which pharmacy/location (including street and city if local pharmacy) is medication to be sent to?  [id number] [place] [place] - [address]   5. Do they need a 30 day or 90 day supply?   Patient stated she has 3 days left of this medication.  Patient has appointment scheduled on 10/3 with Dr. Acharya.

## 2024-04-21 NOTE — Telephone Encounter (Signed)
 RX sent in

## 2024-05-10 ENCOUNTER — Ambulatory Visit: Admitting: Internal Medicine

## 2024-05-10 ENCOUNTER — Other Ambulatory Visit: Payer: Self-pay | Admitting: Internal Medicine

## 2024-05-10 ENCOUNTER — Encounter: Payer: Self-pay | Admitting: Internal Medicine

## 2024-05-10 VITALS — BP 118/78 | HR 77 | Temp 98.1°F | Ht 64.0 in | Wt 131.8 lb

## 2024-05-10 DIAGNOSIS — I10 Essential (primary) hypertension: Secondary | ICD-10-CM | POA: Diagnosis not present

## 2024-05-10 DIAGNOSIS — E785 Hyperlipidemia, unspecified: Secondary | ICD-10-CM

## 2024-05-10 DIAGNOSIS — Z79899 Other long term (current) drug therapy: Secondary | ICD-10-CM

## 2024-05-10 DIAGNOSIS — E118 Type 2 diabetes mellitus with unspecified complications: Secondary | ICD-10-CM

## 2024-05-10 LAB — POCT GLYCOSYLATED HEMOGLOBIN (HGB A1C): Hemoglobin A1C: 6 % — AB (ref 4.0–5.6)

## 2024-05-10 NOTE — Progress Notes (Signed)
 Chief Complaint  Patient presents with   Medical Management of Chronic Issues    HPI: Valerie Gates 68 y.o. come in for Chronic disease management  6 mos check dm meds  etc  No se  of sig  nausea. Rybesys for a while  no concerns  Had renal stone 3 25  No major change vision neuropathy cv pulm complaints  Ocass leg cramps Rare use of lorazepam   may be 2+ times per year   esp pre eye appts. And valtrex   3-4 x per year  HT  on losartan    HLD atorvastatin  last check march ROS: See pertinent positives and negatives per HPI.  Past Medical History:  Diagnosis Date   Allergic rhinitis    Allergy    seasonal   Anxiety    Arthritis    lower right back    Cancer (HCC)    skin cancer-scalp   Diabetes mellitus without complication (HCC)    History of skin cancer    Hx of adenomatous polyp of colon 03/17/2018   Hypertension    Osteopenia     Family History  Problem Relation Age of Onset   Colon polyps Mother    Liver disease Sister    Pancreatic cancer Sister    Drug abuse Daughter    Nephrolithiasis Daughter    Diabetes Other    Hypertension Other    Lung cancer Other    Stroke Other        1st degree female <50   Ulcerative colitis Half-Sister    Colon cancer Neg Hx    Rectal cancer Neg Hx    Stomach cancer Neg Hx    Esophageal cancer Neg Hx    Breast cancer Neg Hx     Social History   Socioeconomic History   Marital status: Divorced    Spouse name: Not on file   Number of children: Not on file   Years of education: Not on file   Highest education level: Not on file  Occupational History   Not on file  Tobacco Use   Smoking status: Former   Smokeless tobacco: Never   Tobacco comments:    Quit 35 years ago  Vaping Use   Vaping status: Never Used  Substance and Sexual Activity   Alcohol use: Yes    Comment: occ   Drug use: No   Sexual activity: Not on file  Other Topics Concern   Not on file  Social History Narrative   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     atorvastatin  (LIPITOR) 20 MG tablet Take 1 tablet (20 mg total) by mouth daily. 45 tablet 0   BETA CAROTENE PO Take by mouth daily.  CALCIUM  PO Take by mouth daily.     fluticasone  (FLONASE ) 50 MCG/ACT nasal spray Use 2 spray(s) in each nostril once daily (Patient taking differently: as needed. Use 2 spray(s) in each nostril once daily) 16 g 0   LORazepam  (ATIVAN ) 0.5 MG tablet TAKE 1 TABLET (0.5 MG TOTAL) BY MOUTH 2 TIMES DAILY AS NEEDED FOR ANXIETY. CAN TAKE 2 IF NEEDED 30 tablet 0   losartan  (COZAAR ) 50 MG tablet TAKE 1 TABLET BY MOUTH EVERY DAY 90 tablet 3   Multiple Vitamin (MULTIVITAMIN ADULT PO) Take by mouth daily.     RYBELSUS  7 MG TABS TAKE 1 TABLET BY MOUTH EVERY DAY FOR DIABETES 30 tablet 6   valACYclovir  (VALTREX ) 1000 MG tablet Take 1 tablet (1,000 mg total) by mouth 2 (two) times daily. X 3 days for outbreaks (Patient taking differently: Take 1,000 mg by mouth 2 (two) times daily. X 3 days for outbreaks, as needed) 20 tablet 3   VITAMIN D PO Take by mouth daily.     No facility-administered medications prior to visit.     EXAM:  BP 118/78 (BP Location: Left Arm, Patient Position: Sitting, Cuff Size: Normal)   Pulse 77   Temp 98.1 F (36.7 C) (Oral)   Ht 5' 4 (1.626 m)   Wt 131 lb 12.8 oz (59.8 kg)   LMP  (LMP Unknown)   SpO2 97%   BMI  22.62 kg/m   Body mass index is 22.62 kg/m.  GENERAL: vitals reviewed and listed above, alert, oriented, appears well hydrated and in no acute distress HEENT: atraumatic, conjunctiva  clear, no obvious abnormalities on inspection of external nose and ears  NECK: no obvious masses on inspection palpation  LUNGS: clear to auscultation bilaterally, no wheezes, rales or rhonchi, good air movement CV: HRRR, no clubbing cyanosis or  peripheral edema nl cap refill  Abdomen:  Sof,t normal bowel sounds without hepatosplenomegaly, no guarding rebound or masses no CVA tenderness MS: moves all extremities without noticeable focal  abnormality PSYCH: pleasant and cooperative, no obvious depression or anxiety Lab Results  Component Value Date   WBC 6.1 11/25/2023   HGB 12.9 11/25/2023   HCT 39.6 11/25/2023   PLT 184 11/25/2023   GLUCOSE 160 (H) 11/25/2023   CHOL 131 11/10/2023   TRIG 108.0 11/10/2023   HDL 52.10 11/10/2023   LDLDIRECT 152.9 06/26/2009   LDLCALC 58 11/10/2023   ALT 22 11/24/2023   AST 19 11/24/2023   NA 142 11/25/2023   K 4.0 11/25/2023   CL 109 11/25/2023   CREATININE 0.88 11/25/2023   BUN 12 11/25/2023   CO2 24 11/25/2023   TSH 1.46 11/10/2023   HGBA1C 6.0 (A) 05/10/2024   MICROALBUR 3.1 (H) 11/10/2023   BP Readings from Last 3 Encounters:  05/10/24 118/78  11/25/23 135/83  11/24/23 (!) 149/88    ASSESSMENT AND PLAN:  Discussed the following assessment and plan:  Controlled type 2 diabetes mellitus with complication, without long-term current use of insulin (HCC) - Plan: POC HgB A1c  Medication management  Essential hypertension  Hyperlipidemia, unspecified hyperlipidemia type Ldl at goal  Bp  at goal  BG at goal  Due labs  march or thereabouts  and cpe  Declines vaccines today Contine lsi and resistance exercise  -Patient advised to return or notify health care team  if  new concerns arise.  Patient Instructions  Good to see you today . A1c is  down to 6.0  very good. Continue meds and healthy  lfiestyle.  Plan labs prefer fasting in about 6 months before cpe yearly    Branden Shallenberger K. Diallo Ponder M.D.

## 2024-05-10 NOTE — Patient Instructions (Signed)
 Good to see you today . A1c is down to 6.0  very good. Continue meds and healthy lfiestyle.  Plan labs prefer fasting in about 6 months before cpe yearly

## 2024-05-10 NOTE — Progress Notes (Signed)
 Future labs for march 26 yearly

## 2024-05-29 ENCOUNTER — Other Ambulatory Visit: Payer: Self-pay | Admitting: Physician Assistant

## 2024-05-29 DIAGNOSIS — E785 Hyperlipidemia, unspecified: Secondary | ICD-10-CM

## 2024-05-29 DIAGNOSIS — R011 Cardiac murmur, unspecified: Secondary | ICD-10-CM

## 2024-05-31 NOTE — Progress Notes (Unsigned)
 Cardiology Office Note    Date:  05/31/2024  ID:  Valerie Gates, Valerie Gates [birth year], MRN [id number] PCP:  Charlett Apolinar POUR, MD  Cardiologist:  Leim Moose, MD  Electrophysiologist:  None   Chief Complaint: ***  History of Present Illness: .    Valerie Gates is a 68 y.o. female with visit-pertinent history of hypertension, type 2 diabetes and hyperlipidemia.  Patient was initially seen by Dr. Moose after abdominal CT for diagnosis of kidney stone and had an incidental finding of calcification in her abdominal aorta.  CT showed mild plaque in the abdominal aorta, no calcium  was seen in the coronary arteries however most proximal portions were not visualized.   Echocardiogram for cardiac murmur indicated LVEF 60 to 65%, no RWMA, G1 DD, RV systolic function and size was normal, no evidence of mitral valve regurgitation or stenosis, aortic valve regurgitation was not visualized, no stenosis was present.  Patient was last in clinic on 02/20/2023 by Valerie Fabry, PA.  Patient was doing well at that time and had remained stable from cardiac standpoint.  Today she presents for follow-up.  She reports that she   Aortic atherosclerosis:  HLD: Last lipid profile indicated  Type 2 DM: Last hemoglobin A1c   Labwork independently reviewed:   ROS: .   *** denies chest pain, shortness of breath, lower extremity edema, fatigue, palpitations, melena, hematuria, hemoptysis, diaphoresis, weakness, presyncope, syncope, orthopnea, and PND.  All other systems are reviewed and otherwise negative.  Studies Reviewed: SABRA    EKG:  EKG is ordered today, personally reviewed, demonstrating ***     CV Studies: Cardiac studies reviewed are outlined and summarized above. Otherwise please see EMR for full report. Cardiac Studies & Procedures   ______________________________________________________________________________________________     ECHOCARDIOGRAM  ECHOCARDIOGRAM COMPLETE  01/22/2022  Narrative ECHOCARDIOGRAM REPORT    Patient Name:   Valerie Gates  Date of Exam: 01/22/2022 Medical Rec #:  [id number]     Height:       64.8 in Accession #:    [id number]    Weight:       134.4 lb Date of Birth:  1955-09-06     BSA:          1.666 m Patient Age:    66 years      BP:           126/78 mmHg Patient Gender: F             HR:           69 bpm. Exam Location:  [address]  Procedure: 2D Echo, Cardiac Doppler, Color Doppler and 3D Echo  Indications:    Murmur R01.1  History:        Patient has no prior history of Echocardiogram examinations. Risk Factors:Diabetes and Hypertension.  Sonographer:    Augustin Seals RDCS Referring Phys: [id number] Valerie Gates  IMPRESSIONS   1. Left ventricular ejection fraction, by estimation, is 60 to 65%. Left ventricular ejection fraction by 3D volume is 57 %. The left ventricle has normal function. The left ventricle has no regional wall motion abnormalities. Left ventricular diastolic parameters are consistent with Grade I diastolic dysfunction (impaired relaxation). 2. Right ventricular systolic function is normal. The right ventricular size is normal. 3. The mitral valve is normal in structure. No evidence of mitral valve regurgitation. No evidence of mitral stenosis. 4. The aortic valve is tricuspid. Aortic valve regurgitation is not visualized. No aortic stenosis is present.  5. The inferior vena cava is normal in size with greater than 50% respiratory variability, suggesting right atrial pressure of 3 mmHg.  FINDINGS Left Ventricle: Left ventricular ejection fraction, by estimation, is 60 to 65%. Left ventricular ejection fraction by 3D volume is 57 %. The left ventricle has normal function. The left ventricle has no regional wall motion abnormalities. The left ventricular internal cavity size was normal in size. There is no left ventricular hypertrophy. Left ventricular diastolic parameters are consistent with  Grade I diastolic dysfunction (impaired relaxation). Normal left ventricular filling pressure.  Right Ventricle: The right ventricular size is normal. No increase in right ventricular wall thickness. Right ventricular systolic function is normal.  Left Atrium: Left atrial size was normal in size.  Right Atrium: Right atrial size was normal in size.  Pericardium: There is no evidence of pericardial effusion.  Mitral Valve: The mitral valve is normal in structure. No evidence of mitral valve regurgitation. No evidence of mitral valve stenosis.  Tricuspid Valve: The tricuspid valve is normal in structure. Tricuspid valve regurgitation is trivial. No evidence of tricuspid stenosis.  Aortic Valve: The aortic valve is tricuspid. Aortic valve regurgitation is not visualized. No aortic stenosis is present.  Pulmonic Valve: The pulmonic valve was normal in structure. Pulmonic valve regurgitation is not visualized. No evidence of pulmonic stenosis.  Aorta: The aortic root is normal in size and structure.  Venous: The inferior vena cava is normal in size with greater than 50% respiratory variability, suggesting right atrial pressure of 3 mmHg.  IAS/Shunts: No atrial level shunt detected by color flow Doppler.   LEFT VENTRICLE PLAX 2D LVIDd:         4.40 cm   Diastology LVIDs:         3.00 cm   LV e' medial:    4.95 cm/s LV PW:         1.00 cm   LV E/e' medial:  13.7 LV IVS:        1.00 cm   LV e' lateral:   7.30 cm/s LVOT diam:     2.00 cm   LV E/e' lateral: 9.3 LV SV:         53 LV SV Index:   32 LVOT Area:     3.14 cm  3D Volume EF: 3D EF:        57 % LV EDV:       80 ml LV ESV:       34 ml LV SV:        46 ml  RIGHT VENTRICLE RV Basal diam:  3.00 cm RV Mid diam:    2.40 cm RV S prime:     11.20 cm/s TAPSE (M-mode): 1.3 cm  LEFT ATRIUM             Index        RIGHT ATRIUM           Index LA diam:        3.30 cm 1.98 cm/m   RA Area:     15.40 cm LA Vol (A2C):   31.6 ml  18.96 ml/m  RA Volume:   33.60 ml  20.17 ml/m LA Vol (A4C):   28.5 ml 17.10 ml/m LA Biplane Vol: 31.4 ml 18.84 ml/m AORTIC VALVE LVOT Vmax:   82.80 cm/s LVOT Vmean:  55.000 cm/s LVOT VTI:    0.168 m  AORTA Ao Root diam: 3.10 cm Ao Asc diam:  3.20 cm  MITRAL VALVE  TRICUSPID VALVE MV Area (PHT): 3.16 cm    TR Peak grad:   13.1 mmHg MV Decel Time: 240 msec    TR Vmax:        181.00 cm/s MV E velocity: 67.60 cm/s MV A velocity: 67.60 cm/s  SHUNTS MV E/A ratio:  1.00        Systemic VTI:  0.17 m Systemic Diam: 2.00 cm  Jerel Croitoru MD Electronically signed by Jerel Balding MD Signature Date/Time: 01/22/2022/1:29:22 PM    Final          ______________________________________________________________________________________________       Current Reported Medications:.    No outpatient medications have been marked as taking for the 06/01/24 encounter (Appointment) with Parmvir Boomer D, NP.    Physical Exam:    VS:  LMP  (LMP Unknown)    Wt Readings from Last 3 Encounters:  05/10/24 131 lb 12.8 oz (59.8 kg)  11/24/23 136 lb (61.7 kg)  11/05/23 138 lb (62.6 kg)    GEN: Well nourished, well developed in no acute distress NECK: No JVD; No carotid bruits CARDIAC: ***RRR, no murmurs, rubs, gallops RESPIRATORY:  Clear to auscultation without rales, wheezing or rhonchi  ABDOMEN: Soft, non-tender, non-distended EXTREMITIES:  No edema; No acute deformity     Asessement and Plan:.     ***     Disposition: F/u with ***  Signed, Taaj Hurlbut D Koren Sermersheim, NP

## 2024-06-01 ENCOUNTER — Encounter: Payer: Self-pay | Admitting: Cardiology

## 2024-06-01 ENCOUNTER — Ambulatory Visit: Attending: Internal Medicine | Admitting: Cardiology

## 2024-06-01 VITALS — BP 126/82 | HR 82 | Ht 64.75 in | Wt 131.0 lb

## 2024-06-01 DIAGNOSIS — E785 Hyperlipidemia, unspecified: Secondary | ICD-10-CM

## 2024-06-01 DIAGNOSIS — I700 Atherosclerosis of aorta: Secondary | ICD-10-CM | POA: Diagnosis not present

## 2024-06-01 DIAGNOSIS — E119 Type 2 diabetes mellitus without complications: Secondary | ICD-10-CM

## 2024-06-01 MED ORDER — ATORVASTATIN CALCIUM 20 MG PO TABS: 20.0000 mg | ORAL_TABLET | Freq: Every day | ORAL | 3 refills | Status: AC

## 2024-06-01 NOTE — Patient Instructions (Signed)
 Medication Instructions:   Your physician recommends that you continue on your current medications as directed. Please refer to the Current Medication list given to you today.  *If you need a refill on your cardiac medications before your next appointment, please call your pharmacy*    Follow-Up: At [facility], you and your health needs are our priority.  As part of our continuing mission to provide you with exceptional heart care, our providers are all part of one team.  This team includes your primary Cardiologist (physician) and Advanced Practice Providers or APPs (Physician Assistants and Nurse Practitioners) who all work together to provide you with the care you need, when you need it.  Your next appointment:   1 year(s)  Provider:   Gayatri A Acharya, MD

## 2024-06-03 ENCOUNTER — Ambulatory Visit: Admitting: Internal Medicine

## 2024-07-20 ENCOUNTER — Other Ambulatory Visit (HOSPITAL_BASED_OUTPATIENT_CLINIC_OR_DEPARTMENT_OTHER): Payer: Self-pay | Admitting: Obstetrics and Gynecology

## 2024-07-20 DIAGNOSIS — M8580 Other specified disorders of bone density and structure, unspecified site: Secondary | ICD-10-CM

## 2024-08-30 LAB — OPHTHALMOLOGY REPORT-SCANNED

## 2024-09-09 ENCOUNTER — Other Ambulatory Visit: Payer: Self-pay | Admitting: Family

## 2024-09-13 ENCOUNTER — Ambulatory Visit: Payer: Self-pay

## 2024-09-13 ENCOUNTER — Ambulatory Visit: Admitting: Family Medicine

## 2024-09-13 NOTE — Telephone Encounter (Signed)
 FYI Only or Action Required?: Action required by provider: decline ER requesting appointment .  Patient was last seen in primary care on 05/10/2024 by Panosh, Apolinar POUR, MD.  Called Nurse Triage reporting Hematuria, Fatigue, Night Sweats, and Muscle Pain.  Symptoms began Saturday .  Interventions attempted: OTC medications: tylneol  and Rest, hydration, or home remedies.  Symptoms are: stable.  Triage Disposition: Go to ED Now (Notify PCP)  Patient/caregiver understands and will follow disposition?: No, wishes to speak with PCP            Reason for Disposition  [1] Diffuse (all over) muscle pains in the shoulders, arms, legs, and back AND [2] dark (cola or tea-colored) or red-colored urine  Answer Assessment - Initial Assessment Questions Patient reports fatigue low energy 2 weeks ago. Not like me kept going ,. Pushing through. Taking tylenol  for low body aches. Saturday blood in urine, has been drinking water throughout the day, urine is orangey yellow. Does have kidney stone followed by urology. 3 nights ago night sweats new onset, waking up drenching wet. Never had night sweats. Has arthritis in back hard to tell if related nothing stand out. Appetite is down. All over muscles pain, taking tylenol  or aleve  and will go away and return. Patient decline ER and wants to be seen in office and wondering if this is stone related  patient requesting  call back., will call [facility]     1. COLOR of URINE: Describe the color of the urine.  (e.g., tea-colored, pink, red, bloody) Do you have blood clots in your urine? (e.g., none, pea, grape, small coin)     Reddish orange on Saturday  2. ONSET: When did the bleeding start?      Saturday blood in urine  3. EPISODES: How many times has there been blood in the urine? or How many times today?     1 time Saturday  4. PAIN with URINATION: Is there any pain with passing your urine? If Yes, ask: How bad is the pain?  (Scale 1-10; or  mild, moderate, severe)     Denies 5. FEVER: Do you have a fever? If Yes, ask: What is your temperature, how was it measured, and when did it start?     Unknown , feels maybe low grade, has night sweats  6. ASSOCIATED SYMPTOMS: Are you passing urine more frequently than usual?     Denies  7. OTHER SYMPTOMS: Do you have any other symptoms? (e.g., back/flank pain, abdomen pain, vomiting)     Denies   Patient denies the following chest pain, shortness of breath, vomiting, fever known, dysuria, flank pain  Protocols used: Urine - Blood In-A-AH Copied from CRM #[id number]. Topic: Clinical - Red Word Triage >> Sep 13, 2024  9:14 AM Antwanette L wrote: Red Word that prompted transfer to Nurse Triage:  The patient reports unusual symptoms, including feeling extremely cold and experiencing fatigue for several weeks. On Saturday, the patient passed blood in his urine and has since developed night sweats

## 2024-09-13 NOTE — Telephone Encounter (Signed)
 Called [facility] spoke with Madeline and advised decline in ER and patient requesting  appolintment

## 2024-09-13 NOTE — Telephone Encounter (Signed)
 Spoke to pt. Inform we can schedule her in with Dr. Jordan for 430pm but our lab is close. Pt states lab is the main reason why she want to be seen. Inform pt her best option is Urgent Care. Pt states she will go to urgent care.

## 2024-11-08 ENCOUNTER — Ambulatory Visit: Admitting: Internal Medicine

## 2024-11-16 ENCOUNTER — Encounter: Admitting: Internal Medicine
